# Patient Record
Sex: Male | Born: 1940 | Race: White | Hispanic: No | Marital: Married | Smoking: Former smoker
Health system: Southern US, Community
[De-identification: ages and names within clinical notes are randomized; demographics above are authoritative.]

## PROBLEM LIST (undated history)

## (undated) DIAGNOSIS — Z720 Tobacco use: Secondary | ICD-10-CM

## (undated) DIAGNOSIS — I4719 Other supraventricular tachycardia: Secondary | ICD-10-CM

## (undated) DIAGNOSIS — Z8679 Personal history of other diseases of the circulatory system: Secondary | ICD-10-CM

## (undated) DIAGNOSIS — I255 Ischemic cardiomyopathy: Secondary | ICD-10-CM

## (undated) DIAGNOSIS — I2510 Atherosclerotic heart disease of native coronary artery without angina pectoris: Secondary | ICD-10-CM

## (undated) DIAGNOSIS — I471 Supraventricular tachycardia: Secondary | ICD-10-CM

## (undated) DIAGNOSIS — I639 Cerebral infarction, unspecified: Secondary | ICD-10-CM

## (undated) DIAGNOSIS — I714 Abdominal aortic aneurysm, without rupture: Secondary | ICD-10-CM

## (undated) DIAGNOSIS — D649 Anemia, unspecified: Secondary | ICD-10-CM

## (undated) DIAGNOSIS — C61 Malignant neoplasm of prostate: Secondary | ICD-10-CM

## (undated) DIAGNOSIS — I219 Acute myocardial infarction, unspecified: Secondary | ICD-10-CM

## (undated) DIAGNOSIS — I498 Other specified cardiac arrhythmias: Secondary | ICD-10-CM

## (undated) DIAGNOSIS — I499 Cardiac arrhythmia, unspecified: Secondary | ICD-10-CM

## (undated) DIAGNOSIS — Z9289 Personal history of other medical treatment: Secondary | ICD-10-CM

## (undated) DIAGNOSIS — R011 Cardiac murmur, unspecified: Secondary | ICD-10-CM

## (undated) DIAGNOSIS — I10 Essential (primary) hypertension: Secondary | ICD-10-CM

## (undated) DIAGNOSIS — N200 Calculus of kidney: Secondary | ICD-10-CM

## (undated) DIAGNOSIS — IMO0001 Reserved for inherently not codable concepts without codable children: Secondary | ICD-10-CM

## (undated) DIAGNOSIS — R351 Nocturia: Secondary | ICD-10-CM

## (undated) DIAGNOSIS — Z8719 Personal history of other diseases of the digestive system: Secondary | ICD-10-CM

## (undated) HISTORY — PX: TONSILLECTOMY: SUR1361

## (undated) HISTORY — DX: Other supraventricular tachycardia: I47.19

## (undated) HISTORY — DX: Ischemic cardiomyopathy: I25.5

## (undated) HISTORY — DX: Tobacco use: Z72.0

## (undated) HISTORY — DX: Anemia, unspecified: D64.9

## (undated) HISTORY — DX: Atherosclerotic heart disease of native coronary artery without angina pectoris: I25.10

## (undated) HISTORY — DX: Supraventricular tachycardia: I47.1

## (undated) HISTORY — PX: PERCUTANEOUS CORONARY STENT INTERVENTION (PCI-S): SHX6016

## (undated) HISTORY — PX: OTHER SURGICAL HISTORY: SHX169

## (undated) HISTORY — PX: PROSTATE SURGERY: SHX751

## (undated) HISTORY — DX: Personal history of other diseases of the circulatory system: Z86.79

## (undated) HISTORY — DX: Acute myocardial infarction, unspecified: I21.9

## (undated) HISTORY — DX: Malignant neoplasm of prostate: C61

## (undated) HISTORY — DX: Abdominal aortic aneurysm, without rupture: I71.4

## (undated) HISTORY — PX: CARDIAC CATHETERIZATION: SHX172

## (undated) HISTORY — DX: Essential (primary) hypertension: I10

## (undated) HISTORY — DX: Cerebral infarction, unspecified: I63.9

---

## 2006-03-16 DIAGNOSIS — F17200 Nicotine dependence, unspecified, uncomplicated: Secondary | ICD-10-CM

## 2006-03-16 DIAGNOSIS — G459 Transient cerebral ischemic attack, unspecified: Secondary | ICD-10-CM

## 2006-03-16 DIAGNOSIS — I10 Essential (primary) hypertension: Secondary | ICD-10-CM

## 2006-05-11 DIAGNOSIS — K921 Melena: Secondary | ICD-10-CM | POA: Insufficient documentation

## 2006-05-11 DIAGNOSIS — J381 Polyp of vocal cord and larynx: Secondary | ICD-10-CM

## 2006-06-15 DIAGNOSIS — L570 Actinic keratosis: Secondary | ICD-10-CM

## 2006-06-15 DIAGNOSIS — C61 Malignant neoplasm of prostate: Secondary | ICD-10-CM

## 2006-07-12 DIAGNOSIS — E785 Hyperlipidemia, unspecified: Secondary | ICD-10-CM

## 2006-08-25 ENCOUNTER — Ambulatory Visit: Admission: RE | Admit: 2006-08-25 | Discharge: 2006-10-17 | Payer: Self-pay | Admitting: Radiation Oncology

## 2007-02-20 DIAGNOSIS — R0602 Shortness of breath: Secondary | ICD-10-CM | POA: Insufficient documentation

## 2007-02-27 DIAGNOSIS — R7303 Prediabetes: Secondary | ICD-10-CM

## 2007-02-27 DIAGNOSIS — D649 Anemia, unspecified: Secondary | ICD-10-CM

## 2007-12-17 DIAGNOSIS — F329 Major depressive disorder, single episode, unspecified: Secondary | ICD-10-CM

## 2008-11-03 ENCOUNTER — Encounter: Payer: Self-pay | Admitting: Cardiovascular Disease

## 2008-11-03 ENCOUNTER — Inpatient Hospital Stay (HOSPITAL_COMMUNITY): Admission: EM | Admit: 2008-11-03 | Discharge: 2008-11-11 | Payer: Self-pay | Admitting: Emergency Medicine

## 2008-11-03 ENCOUNTER — Ambulatory Visit: Payer: Self-pay | Admitting: Cardiology

## 2008-11-04 ENCOUNTER — Encounter: Payer: Self-pay | Admitting: Cardiology

## 2008-11-06 ENCOUNTER — Encounter: Payer: Self-pay | Admitting: Internal Medicine

## 2008-12-02 DIAGNOSIS — I119 Hypertensive heart disease without heart failure: Secondary | ICD-10-CM

## 2008-12-02 DIAGNOSIS — Z8546 Personal history of malignant neoplasm of prostate: Secondary | ICD-10-CM

## 2008-12-02 DIAGNOSIS — I471 Supraventricular tachycardia: Secondary | ICD-10-CM

## 2008-12-02 DIAGNOSIS — I2510 Atherosclerotic heart disease of native coronary artery without angina pectoris: Secondary | ICD-10-CM

## 2008-12-02 DIAGNOSIS — Z8679 Personal history of other diseases of the circulatory system: Secondary | ICD-10-CM | POA: Insufficient documentation

## 2008-12-02 DIAGNOSIS — F17200 Nicotine dependence, unspecified, uncomplicated: Secondary | ICD-10-CM

## 2008-12-03 ENCOUNTER — Ambulatory Visit: Payer: Self-pay | Admitting: Internal Medicine

## 2008-12-05 ENCOUNTER — Telehealth: Payer: Self-pay | Admitting: Internal Medicine

## 2008-12-16 ENCOUNTER — Ambulatory Visit: Payer: Self-pay | Admitting: Cardiovascular Disease

## 2008-12-17 LAB — CONVERTED CEMR LAB
Basophils Absolute: 0 10*3/uL (ref 0.0–0.1)
Basophils Relative: 0.7 % (ref 0.0–3.0)
Eosinophils Absolute: 0.2 10*3/uL (ref 0.0–0.7)
Eosinophils Relative: 3.2 % (ref 0.0–5.0)
HCT: 39.5 % (ref 39.0–52.0)
Hemoglobin: 13.5 g/dL (ref 13.0–17.0)
MCV: 93 fL (ref 78.0–100.0)
Monocytes Absolute: 0.4 10*3/uL (ref 0.1–1.0)
Platelets: 195 10*3/uL (ref 150.0–400.0)
RDW: 13.9 % (ref 11.5–14.6)

## 2009-02-18 ENCOUNTER — Ambulatory Visit: Payer: Self-pay | Admitting: Internal Medicine

## 2009-02-27 ENCOUNTER — Telehealth: Payer: Self-pay | Admitting: Internal Medicine

## 2009-03-03 ENCOUNTER — Telehealth: Payer: Self-pay | Admitting: Internal Medicine

## 2009-05-14 ENCOUNTER — Encounter (INDEPENDENT_AMBULATORY_CARE_PROVIDER_SITE_OTHER): Payer: Self-pay | Admitting: *Deleted

## 2009-05-15 ENCOUNTER — Ambulatory Visit: Payer: Self-pay | Admitting: Cardiology

## 2009-05-15 ENCOUNTER — Inpatient Hospital Stay (HOSPITAL_COMMUNITY): Admission: EM | Admit: 2009-05-15 | Discharge: 2009-05-17 | Payer: Self-pay | Admitting: Internal Medicine

## 2009-05-16 ENCOUNTER — Encounter: Payer: Self-pay | Admitting: Cardiology

## 2009-05-19 ENCOUNTER — Encounter: Payer: Self-pay | Admitting: Cardiology

## 2009-06-05 ENCOUNTER — Ambulatory Visit: Payer: Self-pay | Admitting: Internal Medicine

## 2009-06-19 ENCOUNTER — Telehealth: Payer: Self-pay | Admitting: Internal Medicine

## 2009-11-03 ENCOUNTER — Encounter: Payer: Self-pay | Admitting: Internal Medicine

## 2009-11-03 DIAGNOSIS — I739 Peripheral vascular disease, unspecified: Secondary | ICD-10-CM | POA: Insufficient documentation

## 2009-11-03 DIAGNOSIS — I719 Aortic aneurysm of unspecified site, without rupture: Secondary | ICD-10-CM | POA: Insufficient documentation

## 2009-11-04 ENCOUNTER — Ambulatory Visit: Payer: Self-pay | Admitting: Internal Medicine

## 2009-11-04 ENCOUNTER — Ambulatory Visit: Payer: Self-pay

## 2009-11-04 DIAGNOSIS — I714 Abdominal aortic aneurysm, without rupture, unspecified: Secondary | ICD-10-CM

## 2009-11-04 HISTORY — DX: Abdominal aortic aneurysm, without rupture: I71.4

## 2009-11-04 HISTORY — DX: Abdominal aortic aneurysm, without rupture, unspecified: I71.40

## 2009-11-06 ENCOUNTER — Telehealth: Payer: Self-pay | Admitting: Internal Medicine

## 2009-11-30 ENCOUNTER — Telehealth: Payer: Self-pay | Admitting: Internal Medicine

## 2009-12-31 ENCOUNTER — Encounter: Payer: Self-pay | Admitting: Internal Medicine

## 2009-12-31 ENCOUNTER — Telehealth: Payer: Self-pay | Admitting: Cardiovascular Disease

## 2010-01-05 ENCOUNTER — Encounter: Payer: Self-pay | Admitting: Internal Medicine

## 2010-01-22 ENCOUNTER — Telehealth: Payer: Self-pay | Admitting: Internal Medicine

## 2010-02-05 ENCOUNTER — Encounter (INDEPENDENT_AMBULATORY_CARE_PROVIDER_SITE_OTHER): Payer: Self-pay | Admitting: *Deleted

## 2010-04-14 NOTE — Letter (Signed)
Summary: Appointment - Reminder 2  [employer], Main Office  [address]   [place], [place] [address]   Phone: [phone]  Fax: [phone]     May 14, 2009 MRN: [id number]   [facility] [address] [place], [place]  [address]   Dear Mr. Tufo,  Our records indicate that it is time to schedule a follow-up appointment with Dr. Eden Emms. It is very important that we reach you to schedule this appointment. We look forward to participating in your health care needs. Please contact us at the number listed above at your earliest convenience to schedule your appointment.  If you are unable to make an appointment at this time, give [place] a call so we can update our records.     Sincerely,   Migdalia Dk [facility] Scheduling Team

## 2010-04-14 NOTE — Progress Notes (Signed)
Summary: pt having bp problems  Phone Note Call from Patient   Caller: Spouse delores Reason for Call: Talk to Nurse Summary of Call: pt's wife calling re bp problems-pls call [phone] Initial call taken by: Glynda Jaeger,  November 30, 2009 1:55 PM  Follow-up for Phone Call        BP is up but he forgot to take his medication.  Instructed pt to take his medications and to take an extra 1/2 of Clonidine 0.2mg  if needed to bring BP down.  Will call again tomorrow to check on him [phone] Dennis Bast, RN, BSN  November 30, 2009 5:14 PM lmom for pt to check on BP today Dennis Bast, RN, BSN  December 01, 2009 8:45 AM BP today 150/100 this was prior to taking his medications   BP 167/111 HR 80  Will take an extra Clonidine 0.1mg  now Spoke with Dr Johney Frame  Pt to start  HCTZ 25mg  and check   BMP with PCP in 2 weeks.  LMOM for pt with above Dennis Bast, RN, BSN  December 01, 2009 5:12 PM  Additional Follow-up for Phone Call Additional follow up Details #1::        spoke with pt;s wife they are going to pick the medication up tomorrow his BP was some beter today.  Still has times that it will spike up  I think they are checking it way too often.  She is going to back off on checking and keep a log on HCTZ and let me know what it is Dennis Bast, RN, BSN  December 02, 2009 5:48 PM     New/Updated Medications: HYDROCHLOROTHIAZIDE 25 MG TABS (HYDROCHLOROTHIAZIDE) one by mouth daily Prescriptions: HYDROCHLOROTHIAZIDE 25 MG TABS (HYDROCHLOROTHIAZIDE) one by mouth daily  #30 x 11   Entered by:   Dennis Bast, RN, BSN   Authorized by:   Hillis Range, MD   Signed by:   Dennis Bast, RN, BSN on 12/01/2009   Method used:   Electronically to        [facility]* (retail)       [address]       [place], [place]         Ph: [phone]       Fax: [phone]   RxID:   [id number]

## 2010-04-14 NOTE — Letter (Signed)
Summary: [facility] - Kidney US Bilateral  [facility] - Kidney US Bilateral   Imported By: Marylou Mccoy 01/07/2010 14:53:36  _____________________________________________________________________  External Attachment:    Type:   Image     Comment:   External Document

## 2010-04-14 NOTE — Miscellaneous (Signed)
Summary: Orders Update  Clinical Lists Changes  Orders: Added new Test order of Carotid Duplex (Carotid Duplex) - Signed 

## 2010-04-14 NOTE — Assessment & Plan Note (Signed)
Summary: eph/jml   Visit Type:  Follow-up Primary Provider:      History of Present Illness: The patient presents today for routine electrophysiology followup. He reports doing very well since his recent hospitalization for atrial tachycardia.  He is unaware of any further symptoms of tachycardia.  He has been able to quit smoking. The patient denies symptoms of palpitations, chest pain, shortness of breath, orthopnea, PND, lower extremity edema, dizziness, presyncope, syncope, or neurologic sequela.  He reports occasional symptoms of claudication.  The patient is tolerating medications without difficulties and is otherwise without complaint today.   Current Medications (verified): 1)  Aspirin 81 Mg Tbec (Aspirin) .... Take One Tablet By Mouth Daily 2)  Metoprolol Tartrate 25 Mg Tabs (Metoprolol Tartrate) .... Take One Tablet By Mouth Twice A Day 3)  Lisinopril 20 Mg Tabs (Lisinopril) .[name].. 1 Tab By Mouth Two Times A Day 4)  Clonidine Hcl 0.2 Mg Tabs (Clonidine Hcl) .[name].. 1 Tab By Mouth Two Times A Day 5)  Cardizem La 180 Mg Xr24h-Tab (Diltiazem Hcl Coated Beads) .... Take One Tablet By Mouth Once Daily. 6)  Plavix 75 Mg Tabs (Clopidogrel Bisulfate) .... Take One Tablet By Mouth Daily 7)  Selenium 100 Mcg Tabs (Selenium) .[name].. 1 By Mouth Once Daily 8)  Multaq 400 Mg Tabs (Dronedarone Hcl) .[name].. 1 By Mouth Two Times A Day 9)  Chantix 1 Mg Tabs (Varenicline Tartrate) .... Take One Tablet By Mouth Twice Daily.  Allergies: 1)  ! 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  bare-metal stent to the left circumflex about 10   years ago.    recent PCI of the LAD  Prostate surgery  Social History: Reviewed  history from 12/03/2008 and no changes required.  The patient lives in [place], [place] with his  wife.  He is retired, but remains active, working around his house.  He  smokes 1-pack-per day and has approximately 80-pack-year smoking  history.  He drinks several shots most nights.  No illicit drug use and only  takes multivitamin as far as herbal medication goes.  His diet is  regular and while he is active, he does not regularly exercise.   Review of Systems       All systems are reviewed and negative except as listed in the HPI.   Vital Signs:  Patient profile:   70 year old male Height:      71 inches Weight:      169 pounds BMI:     23.66 Pulse rate:   48 / minute BP sitting:   128 / 80  (left arm)  Vitals Entered By: Laurance Flatten CMA (June 05, 2009 10:42 AM)  Physical Exam  General:  Well developed, well nourished, in no acute distress. Head:  normocephalic and atraumatic Eyes:  PERRLA/EOM intact; conjunctiva and lids normal. Mouth:  Teeth, gums and palate normal. Oral mucosa normal. Neck:  Neck supple, no JVD. No masses, thyromegaly or abnormal cervical nodes.  Lungs:  Clear bilaterally to auscultation and percussion. Heart:  Non-displaced PMI, chest non-tender; regular rate and rhythm, S1, S2 without murmurs, rubs or gallops. Carotid upstroke normal, no bruit. Normal abdominal aortic size, no bruits. Femorals normal pulses, no bruits. Pedals normal pulses. No edema, no varicosities. Abdomen:  Bowel sounds  positive; abdomen soft and non-tender without masses, organomegaly, or hernias noted. No hepatosplenomegaly. Msk:  Back normal, normal gait. Muscle strength and tone normal. Pulses:  pulses normal in all 4 extremities Extremities:  No clubbing or cyanosis. Neurologic:  Alert and oriented x 3. Skin:  Intact without lesions or rashes. Psych:  Normal affect.   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 continue current medical regimen we discussed ablation as an option and the patient requests medical therapy for now He will consider ablation if his arrhythmias worsen  Problem # 2:  CAD (ICD-414.00) stable no symptmos of ischemia recent myoview reviewed  Problem # 3:  HYPERTENSION, UNSPECIFIED (ICD-401.9) stable no changes  Problem # 4:  TRANSIENT ISCHEMIC ATTACKS, HX OF (ICD-V12.50)  Orders: Carotid Duplex (Carotid Duplex)  Problem # 5:  TOBACCO ABUSE (ICD-305.1) cessation encouraged we will obtain an abdominal US to rule out AAA we will also evaluated with noninvasive studies of the lower extremities to evaluate claudication  Other Orders: Arterial Duplex Lower Extremity (Arterial Duplex Low) Abdominal Aorta Duplex (Abd Aorta Duplex)  Patient Instructions: 1)  Your physician recommends that you schedule a follow-up appointment in: 4 months with Dr Johney Frame  2)  on same day have all test 3)  Your physician has requested that you have an abdominal aorta duplex. During this test, an ultrasound is used to evaluate the aorta. Allow 30 minutes for this exam. Do not eat after midnight the day before and avoid carbonated beverages. There are no restrictions or special instructions. 4)  Your physician has requested that you have a carotid duplex. This test is an ultrasound of the carotid arteries in your neck. It looks at blood flow through these arteries that supply the brain with blood. Allow one hour for this exam. There are no restrictions or special instructions. 5)  Your physician has requested that you have a lower or upper extremity arterial duplex.  This test is an ultrasound of the arteries in the legs or arms.  It looks at arterial blood flow in the legs and arms.  Allow one hour for Lower and Upper Arterial scans. There are no restrictions or special instructions.

## 2010-04-14 NOTE — Progress Notes (Signed)
Summary: refill  Phone Note Refill Request Message from:  Patient on November 06, 2009 1:16 PM  Refills Requested: Medication #1:  MULTAQ 400 MG TABS 1 by mouth two times a day. [facility]  [phone]  Initial call taken by: Judie Grieve,  November 06, 2009 1:17 PM    Prescriptions: MULTAQ 400 MG TABS (DRONEDARONE HCL) 1 by mouth two times a day  #180 x 3   Entered by:   Laurance Flatten CMA   Authorized by:   Hillis Range, MD   Signed by:   Laurance Flatten CMA on 11/06/2009   Method used:   Electronically to        [facility]* (retail)       [address]       [place], [place]         Ph: [phone]       Fax: [phone]   RxID:   [id number]

## 2010-04-14 NOTE — Progress Notes (Signed)
Summary: refill**[facility]  Phone Note Refill Request   Refills Requested: Medication #1:  CHANTIX 1 MG TABS Take one tablet by mouth twice daily..   Supply Requested: 3 months [facility] in [place] ph [phone]   Method Requested: Electronic Initial call taken by: Migdalia Dk,  June 19, 2009 11:35 AM  Follow-up for Phone Call        will foward to lela in order to get in touch with matthew spencer regading chantix   Pharmacy number is [phone] Follow-up by: Kem Parkinson,  June 19, 2009 4:38 PM  Additional Follow-up for Phone Call Additional follow up Details #1::        please refill chantix for Mr Hargadon.  His wife will need to have her doctor refill hers. Additional Follow-up by: Hillis Range, MD,  June 25, 2009 5:46 PM    Additional Follow-up for Phone Call Additional follow up Details #2::    sent script for Mr. Gelpi. Called pt's home , but was unable to reach pt or wife. Nor could I leave a message. Follow-up by: Laurance Flatten CMA,  June 26, 2009 8:17 AM  Additional Follow-up for Phone Call Additional follow up Details #3:: Details for Additional Follow-up Action Taken: Contacted pt's wife with message from doctor.  Additional Follow-up by: Laurance Flatten CMA,  July 01, 2009 9:30 AM  Prescriptions: CHANTIX 1 MG TABS (VARENICLINE TARTRATE) Take one tablet by mouth twice daily.  #60 x 1   Entered by:   Laurance Flatten CMA   Authorized by:   Hillis Range, MD   Signed by:   Laurance Flatten CMA on 06/26/2009   Method used:   Electronically to        [facility]* (retail)       [address]       [place], [place]         Ph: [phone]       Fax: [phone]   RxID:   [id number]

## 2010-04-14 NOTE — Miscellaneous (Signed)
Summary: Orders Update  Clinical Lists Changes  Problems: Added new problem of AORTIC ATHEROSCLEROSIS (ICD-440.0) Orders: Added new Test order of Abdominal Aorta Duplex (Abd Aorta Duplex) - Signed 

## 2010-04-14 NOTE — Letter (Signed)
Summary: [employer]  Authorization Notification  [employer]  Authorization Notification   Imported By: Roderic Ovens 06/12/2009 11:17:45  _____________________________________________________________________  External Attachment:    Type:   Image     Comment:   External Document

## 2010-04-14 NOTE — Progress Notes (Signed)
Summary: spouse has a question [facility])  Phone Note Call from Patient Call back at Home Phone [phone]   Caller: Spouse delores  Reason for Call: Talk to Nurse Summary of Call: please call-wife has [phone] Initial call taken by: Glynda Jaeger,  January 22, 2010 10:06 AM  Follow-up for Phone Call        Message was just recently routed to the triage desktop. I left a message for the pt to call. Sherri Rad, RN, BSN  January 22, 2010 4:40 PM  spoke with pt and then she spoke with Dr Johney Frame at the hospital this weekend  He answered her questions Dennis Bast, RN, BSN  January 26, 2010 4:45 PM

## 2010-04-14 NOTE — Progress Notes (Signed)
Summary: pt needs refill  Phone Note Refill Request Call back at Home Phone [phone] Message from:  [facility]  Refills Requested: Medication #1:  CARDIZEM LA 180 MG XR24H-TAB Take one tablet by mouth once daily. Initial call taken by: Omer Jack,  January 22, 2010 3:16 PM    Prescriptions: CARDIZEM LA 180 MG XR24H-TAB (DILTIAZEM HCL COATED BEADS) Take one tablet by mouth once daily.  #30 x 5   Entered by:   Laurance Flatten CMA   Authorized by:   Hillis Range, MD   Signed by:   Laurance Flatten CMA on 01/22/2010   Method used:   Electronically to        [facility]* (retail)       [address]       [place], [place]         Ph: [phone]       Fax: [phone]   RxID:   [id number]

## 2010-04-14 NOTE — Progress Notes (Signed)
Summary: c/o b/p high for several days.  Phone Note Call from Patient Call back at Home Phone [phone] Call back at [phone]   Caller: Spouse Reason for Call: Talk to Nurse Summary of Call: c/o pt on new blood pressure. b/p today 121/69. pt b/p for several day has been high. discuss option - meds.  Initial call taken by: Lorne Skeens,  December 31, 2009 1:51 PM  Follow-up for Phone Call        spoke with pt wife, pt bp is all over the place. he was seen by primary care md and they cut his bp in 1/2 and are doing alot of testing. ask for testing to be sent to [place]. Deliah Goody, RN  December 31, 2009 6:45 PM

## 2010-04-14 NOTE — Miscellaneous (Signed)
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 

## 2010-04-14 NOTE — Miscellaneous (Signed)
  Clinical Lists Changes  Medications: Added new medication of NITROSTAT 0.4 MG SUBL (NITROGLYCERIN) 1 tablet under tongue at onset of chest pain; you may repeat every 5 minutes for up to 3 doses. - Signed Rx of NITROSTAT 0.4 MG SUBL (NITROGLYCERIN) 1 tablet under tongue at onset of chest pain; you may repeat every 5 minutes for up to 3 doses.;  #25 x 12;  Signed;  Entered by: Deliah Goody, RN;  Authorized by: Hillis Range, MD;  Method used: Electronically to [facility] [address], [place], [place]  , Ph: [phone], Fax: [phone]    Prescriptions: NITROSTAT 0.4 MG SUBL (NITROGLYCERIN) 1 tablet under tongue at onset of chest pain; you may repeat every 5 minutes for up to 3 doses.  #25 x 12   Entered by:   Deliah Goody, RN   Authorized by:   Hillis Range, MD   Signed by:   Deliah Goody, RN on 02/05/2010   Method used:   Electronically to        [facility]* (retail)       [address]       [place], [place]         Ph: [phone]       Fax: [phone]   RxID:   [id number]

## 2010-04-14 NOTE — Assessment & Plan Note (Signed)
Summary: per check out/also have aorta/aterial/cartiod before/saf   Visit Type:  Follow-up Primary Provider:  Colon Branch, MD, [facility]   History of Present Illness: The patient presents today for routine electrophysiology followup. He reports doing very well since his last visit.  He is unaware of any further symptoms of tachycardia.  He has  not been able to quit smoking. The patient denies symptoms of palpitations, chest pain, shortness of breath, orthopnea, PND, lower extremity edema, dizziness, presyncope, syncope, or neurologic sequela.  He reports occasional symptoms of claudication.  The patient is tolerating medications without difficulties and is otherwise without complaint today.   Current Medications (verified): 1)  Aspirin 81 Mg Tbec (Aspirin) .... Take One Tablet By Mouth Daily 2)  Metoprolol Tartrate 25 Mg Tabs (Metoprolol Tartrate) .... Take One Tablet By Mouth Twice A Day 3)  Lisinopril 20 Mg Tabs (Lisinopril) .[name].. 1 Tab By Mouth Two Times A Day 4)  Clonidine Hcl 0.2 Mg Tabs (Clonidine Hcl) .[name].. 1 Tab By Mouth Two Times A Day 5)  Cardizem La 180 Mg Xr24h-Tab (Diltiazem Hcl Coated Beads) .... Take One Tablet By Mouth Once Daily. 6)  Plavix 75 Mg Tabs (Clopidogrel Bisulfate) .... Take One Tablet By Mouth Daily 7)  Selenium 100 Mcg Tabs (Selenium) .[name].. 1 By Mouth Once Daily 8)  Multaq 400 Mg Tabs (Dronedarone Hcl) .[name].. 1 By Mouth Two Times A Day  Allergies: 1)  ! 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  bare-metal stent to the left circumflex about 10   years ago.    recent PCI of the LAD  Prostate  surgery  Social History: Reviewed history from 12/03/2008 and no changes required.  The patient lives in [place], [place] with his  wife.  He is retired, but remains active, working around his house.  He  smokes 1-pack-per day and has approximately 80-pack-year smoking  history.  He drinks several shots most nights.  No illicit drug use and only  takes multivitamin as far as herbal medication goes.  His diet is  regular and while he is active, he does not regularly exercise.   Review of Systems       All systems are reviewed and negative except as listed in the HPI.   Vital Signs:  Patient profile:   70 year old male Height:      71 inches Weight:      160 pounds BMI:     22.40 Pulse rate:   50 / minute BP sitting:   160 / 84  (left arm)  Vitals Entered By: Laurance Flatten CMA (November 04, 2009 10:47 AM)  Physical Exam  General:  Well developed, well nourished, in no acute distress. Head:  normocephalic and atraumatic Mouth:  Teeth, gums and palate normal. Oral mucosa normal. Neck:  Neck supple, no JVD. No masses, thyromegaly or abnormal cervical nodes.  Lungs:  Clear bilaterally to auscultation and percussion. Heart:  Non-displaced PMI, chest non-tender; regular rate and rhythm, S1, S2 without murmurs, rubs or gallops. Carotid upstroke normal, no bruit. Normal abdominal aortic size, no bruits. Femorals normal pulses, no bruits. Pedals normal pulses. No edema, no varicosities. Abdomen:  Bowel sounds positive; abdomen soft and non-tender without masses,  organomegaly, or hernias noted. No hepatosplenomegaly. Msk:  Back normal, normal gait. Muscle strength and tone normal. Pulses:  pulses normal in all 4 extremities Extremities:  No clubbing or cyanosis. Neurologic:  Alert and oriented x 3.   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

## 2010-05-13 ENCOUNTER — Telehealth: Payer: Self-pay | Admitting: Internal Medicine

## 2010-05-20 NOTE — Progress Notes (Signed)
Summary: rx refill  Phone Note Refill Request Call back at Home Phone [phone] Call back at [phone] Message from:  Patient on May 13, 2010 2:54 PM  Refills Requested: Medication #1:  LISINOPRIL 20 MG TABS 1 tab by mouth two times a day  Method Requested: Telephone to Pharmacy Initial call taken by: Roe Coombs,  May 13, 2010 2:54 PM  Follow-up for Phone Call        calling back for refill [facility] to [phone] pt is out medication and would like a call when the refill is called in [facility]  May 14, 2010 1:08 PM    Prescriptions: LISINOPRIL 20 MG TABS (LISINOPRIL) 1 tab by mouth two times a day  #180 x 3   Entered by:   Laurance Flatten CMA   Authorized by:   Hillis Range, MD   Signed by:   Laurance Flatten CMA on 05/14/2010   Method used:   Electronically to        [facility]* (retail)       [address]       [place], [place]         Ph: [phone]       Fax: [phone]   RxID:   [id number]

## 2010-06-07 LAB — CBC
HCT: 39.3 % (ref 39.0–52.0)
MCHC: 33.8 g/dL (ref 30.0–36.0)
Platelets: 173 10*3/uL (ref 150–400)
RBC: 4.28 MIL/uL (ref 4.22–5.81)
RDW: 15 % (ref 11.5–15.5)
WBC: 8.5 10*3/uL (ref 4.0–10.5)

## 2010-06-07 LAB — BASIC METABOLIC PANEL
BUN: 14 mg/dL (ref 6–23)
Calcium: 8.3 mg/dL — ABNORMAL LOW (ref 8.4–10.5)
Creatinine, Ser: 0.9 mg/dL (ref 0.4–1.5)
GFR calc non Af Amer: >60 mL/min (ref >60)
Sodium: 135 mEq/L (ref 135–145)

## 2010-06-07 LAB — LIPID PANEL
HDL: 33 mg/dL — ABNORMAL LOW (ref >39)
Total CHOL/HDL Ratio: 6.5 RATIO
Triglycerides: 128 mg/dL (ref <150)

## 2010-06-07 LAB — CK TOTAL AND CKMB (NOT AT ARMC)
CK, MB: 1.2 ng/mL (ref 0.3–4.0)
Relative Index: INVALID (ref 0.0–2.5)
Relative Index: INVALID (ref 0.0–2.5)

## 2010-06-07 LAB — TROPONIN I
Troponin I: 0.01 ng/mL (ref 0.00–0.06)
Troponin I: 0.02 ng/mL (ref 0.00–0.06)

## 2010-06-08 ENCOUNTER — Telehealth: Payer: Self-pay | Admitting: Internal Medicine

## 2010-06-08 NOTE — Telephone Encounter (Signed)
Called patient back and left message on voicemail to call me back

## 2010-06-10 NOTE — Telephone Encounter (Signed)
Left another message for patient.  If he needs me to call back if not we will see at next scheduled visit

## 2010-06-19 LAB — CARDIAC PANEL(CRET KIN+CKTOT+MB+TROPI)
CK, MB: 1.1 ng/mL (ref 0.3–4.0)
CK, MB: 37.4 ng/mL — ABNORMAL HIGH (ref 0.3–4.0)
CK, MB: 55.8 ng/mL — ABNORMAL HIGH (ref 0.3–4.0)
Relative Index: 14.7 — ABNORMAL HIGH (ref 0.0–2.5)
Relative Index: 17.8 — ABNORMAL HIGH (ref 0.0–2.5)
Relative Index: INVALID (ref 0.0–2.5)
Total CK: 176 U/L (ref 7–232)
Total CK: 254 U/L — ABNORMAL HIGH (ref 7–232)
Total CK: 31 U/L (ref 7–232)
Total CK: 33 U/L (ref 7–232)

## 2010-06-19 LAB — PROTIME-INR
INR: 1 (ref 0.00–1.49)
Prothrombin Time: 13 seconds (ref 11.6–15.2)
Prothrombin Time: 13.4 seconds (ref 11.6–15.2)

## 2010-06-19 LAB — BASIC METABOLIC PANEL
BUN: 16 mg/dL (ref 6–23)
BUN: 16 mg/dL (ref 6–23)
BUN: 7 mg/dL (ref 6–23)
CO2: 25 mEq/L (ref 19–32)
Chloride: 102 mEq/L (ref 96–112)
Chloride: 104 mEq/L (ref 96–112)
Creatinine, Ser: 0.75 mg/dL (ref 0.4–1.5)
Creatinine, Ser: 0.79 mg/dL (ref 0.4–1.5)
GFR calc non Af Amer: >60 mL/min (ref >60)
GFR calc non Af Amer: >60 mL/min (ref >60)
Glucose, Bld: 111 mg/dL — ABNORMAL HIGH (ref 70–99)
Glucose, Bld: 111 mg/dL — ABNORMAL HIGH (ref 70–99)
Potassium: 3.7 mEq/L (ref 3.5–5.1)
Potassium: 4.1 mEq/L (ref 3.5–5.1)
Sodium: 134 mEq/L — ABNORMAL LOW (ref 135–145)

## 2010-06-19 LAB — CBC
HCT: 34.4 % — ABNORMAL LOW (ref 39.0–52.0)
Hemoglobin: 11.8 g/dL — ABNORMAL LOW (ref 13.0–17.0)
Hemoglobin: 13.3 g/dL (ref 13.0–17.0)
MCV: 94 fL (ref 78.0–100.0)
Platelets: 161 10*3/uL (ref 150–400)
RDW: 15.2 % (ref 11.5–15.5)
WBC: 7.4 10*3/uL (ref 4.0–10.5)

## 2010-06-19 LAB — CK TOTAL AND CKMB (NOT AT ARMC)
CK, MB: 1.3 ng/mL (ref 0.3–4.0)
Total CK: 43 U/L (ref 7–232)

## 2010-06-19 LAB — DIFFERENTIAL
Basophils Absolute: 0 10*3/uL (ref 0.0–0.1)
Eosinophils Relative: 3 % (ref 0–5)
Lymphocytes Relative: 23 % (ref 12–46)
Neutro Abs: 5.1 10*3/uL (ref 1.7–7.7)
Neutrophils Relative %: 70 % (ref 43–77)

## 2010-06-19 LAB — TROPONIN I: Troponin I: 0.03 ng/mL (ref 0.00–0.06)

## 2010-06-19 LAB — LIPID PANEL
Cholesterol: 184 mg/dL (ref 0–200)
LDL Cholesterol: 135 mg/dL — ABNORMAL HIGH (ref 0–99)
Triglycerides: 108 mg/dL (ref <150)

## 2010-06-19 LAB — TSH: TSH: 1.784 u[IU]/mL (ref 0.350–4.500)

## 2010-07-07 ENCOUNTER — Telehealth: Payer: Self-pay | Admitting: Internal Medicine

## 2010-07-07 NOTE — Telephone Encounter (Signed)
[phone] [facility]-refills of clonidine .02 mg and lopressor 25mg 

## 2010-07-08 MED ORDER — METOPROLOL TARTRATE 25 MG PO TABS: 25.0000 mg | ORAL_TABLET | Freq: Two times a day (BID) | ORAL | Status: DC

## 2010-07-08 MED ORDER — CLONIDINE HCL 0.2 MG PO TABS: 0.2000 mg | ORAL_TABLET | Freq: Two times a day (BID) | ORAL | Status: DC

## 2010-07-27 NOTE — H&P (Signed)
Jerry Gilbert, Jerry Gilbert                ACCOUNT NO.:  [id number]   MEDICAL RECORD NO.:  [id number]          PATIENT TYPE:  INP   LOCATION:  3730                         FACILITY:  [facility]   PHYSICIAN:  Jerry C. Wall, MD, FACCDATE OF BIRTH:  Jul 31, 1940   DATE OF ADMISSION:  11/03/2008  DATE OF DISCHARGE:                              HISTORY & PHYSICAL   PRIMARY CARDIOLOGIST:  (New) Jerry C. Jerry Emms, MD, [facility]   CHIEF COMPLAINT:  Chest pain, tachy palpitations, dyspnea on exertion,  nausea.   HISTORY OF PRESENT ILLNESS:  Mr. 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   The patient reports that symptoms for approximately last 3 weeks  intermittently and recent workup by his old cardiologist with diagnosis  of paroxysmal atrial tachycardia.  The patient also had an  echocardiogram completed that showed low-normal EF at 50% but no other  details known from that study.  The patient has continued to have  symptoms and went to his primary care office and was seen by a nurse  practitioner, who increased her Lopressor from 25 mg p.o. b.i.d. to 25  q.a.m. and 50 mg q.p.m.  Unfortunately, the patient's symptoms have  continued and he went to the emergency department in some town in  [place].  His wife requested that he be transfer to [facility]  so that he can be evaluated by [facility].  Upon arrival to  [facility], the patient's heart rate initially in the 140s  with  BP 146/118, now bouncing from 60-140s, pressure 145/90.  EKG shows  normal sinus rhythm without acute changes, one PVC.  No lab work or  chest x-ray available currently.   PAST MEDICAL HISTORY:  1. CAD S/P bare-metal stent to the circumflex and      normal/nonobstructive disease in the rest of his anatomy      approximately 10 years ago per the patient's report.  2. Labile hypertension.  3. PAT, diagnosed approximately 3 weeks ago.  4. History of TIAs.  5. History of prostate cancer, S/P surgery, radiation and hormone      therapy.   SOCIAL HISTORY:  The patient lives in [place], [place] with his  wife.  He is retired, but remains active, working around his house.  He  smokes 1-pack-per day and has approximately 80-pack-year smoking  history.  He rarely consumes alcohol.  No illicit drug use and only  takes multivitamin as far as herbal medication goes.  His diet is  regular and while he is active, he does not regularly exercise.   FAMILY HISTORY:  Mother died at age 5  from heart attack.  Father  deceased but no history of CAD.  One brother with CABG ? age of  diagnosis with CAD and another brother status post cardioversion,  question arrhythmia.   REVIEW OF SYSTEMS:  Please see HPI.  All other systems reviewed,   REVIEW OF SYSTEMS:  The patient has chronic dyspnea on exertion,  significantly worse over the last 3 weeks.  Also significant worsening  to orthopnea, abdominal fullness, lower extremity edema although mild  currently, and mild presyncope recently.  Also recent nausea without  vomiting.  All other systems reviewed and were negative.   CODE STATUS:  Full.   ALLERGIES:  1. CELEBREX.  2. COUMADIN and PLAVIX (nosebleeds).   MEDICATIONS:  1. Aspirin 81 mg p.o. daily.  2. Lisinopril 20 mg p.o. b.i.d.  3. Lopressor 25 mg p.o. q.a.m. and 50 mg p.o. q.p.m.  4. Clonidine 0.2 mg p.o. b.i.d.   PHYSICAL EXAMINATION:  VITAL SIGNS:  Temp 97.1 degrees Fahrenheit,  BP  146/118 down to 145/90, pulse 60-140, respiratory rate 21, O2 saturation  98% on 2 liters by nasal cannula.  GENERAL:  The patient is alert and oriented x3 in no apparent distress,  is able to move and speak fairly easily without any respiratory  distress.  HEAD:  Normocephalic, atraumatic.  Pupils equal, round, reactive to  light.  Extraocular muscles are intact.  Nares are patent without  discharge.  Dentition is poor.  Oropharynx without erythema or exudates.  NECK:  Supple without lymphadenopathy.  No thyromegaly.  No bruits.  JVD  approximately 6-8 cm.  CARDIOVASCULAR:  Heart rate is irregular with audible S1 and S2.  No  clicks, rubs, murmurs, or gallops.  Pulses 1+ bilaterally lower  extremities and 2+ bilaterally in radials.  LUNGS:  Clear to auscultation bilaterally.  SKIN:  No rashes, lesions, or petechiae.  ABDOMEN:  Soft, nontender, nondistended.  Normal abdominal bowel sounds.  No rebound or guarding.  No hepatosplenomegaly on palpation.  EXTREMITIES:  No clubbing or cyanosis.  Trace ankle edema bilaterally.  MUSCULOSKELETAL:  No joint deformity or effusions or spinal or CVA  tenderness.  NEURO:  Cranial nerves II through XII are grossly intact.  Strength 5/5  in all extremities and axial groups.  Normal sensation throughout and  normal cerebellar function.   RADIOLOGY:  1. Chest x-ray pending.  2. EKG, sinus bradycardia, rate 55, no acute ST-T wave changes, no      significant Q-waves, one PVC, left axis deviation, no evidence of      hypertrophy, question left anterior fascicular block, intervals PR      140, QRS 90, and QTc 371.   LABORATORY DATA:  Pending.   ASSESSMENT AND PLAN:  Mr. Jerry Gilbert is a 70 year old Caucasian male with  known coronary artery disease status post bare-metal stent to the  circumflex and nonobstructive disease.  Otherwise per the patient's  report, labile hypertension, ongoing 80-plus-pack-year tobacco abuse  disorder, history of  transient ischemic attacks, and recent diagnosis of  paroxysmal atrial tachycardia presenting with chest pain, shortness of  breath, dyspnea on exertion, nausea, and tachy palpitations in the  setting of paroxysmal atrial tachycardia into the 140s.  Paroxysmal atrial tachycardia.  Admit to telemetry, cycle cardiac  enzymes, start p.o. diltiazem, check chest x-ray, consult EP if rhythm  is stubborn.  Also, we will plan for Myoview tomorrow as long as cardiac  enzymes are  negative and we will check a 2-D echocardiogram today.  The patient  needs to undergo tobacco cessation counseling as well as decreased  caffeine intake.  Dr. Eden Gilbert is to see the patient tomorrow in the a.m..  Dr. Daleen Squibb has seen the patient, agrees to this assessment and plan.      Jarrett Ables, PAC      Jerry C. Daleen Squibb, MD, [facility]  Electronically Signed    MS/MEDQ  D:  11/03/2008  T:  11/04/2008  Job:  [id number]

## 2010-07-27 NOTE — Consult Note (Signed)
Jerry Gilbert, Jerry Gilbert                ACCOUNT NO.:  [id number]   MEDICAL RECORD NO.:  [id number]          PATIENT TYPE:  INP   LOCATION:  2039                         FACILITY:  [facility]   PHYSICIAN:  Hillis Range, MD       DATE OF BIRTH:  1940-05-16   DATE OF CONSULTATION:  DATE OF DISCHARGE:                                 CONSULTATION   REQUESTING PHYSICIAN:  Arturo Morton. Riley Kill, MD, [facility]   REASON FOR CONSULTATION:  Atrial tachycardia.   HISTORY OF PRESENT ILLNESS:  Mr. Sweetin is a pleasant 70 year old  gentleman with a history of coronary artery disease, hypertension, and  longstanding tobacco use who was recently diagnosed with paroxysmal  atrial tachycardia.  He notes that over the past 3-4 weeks he has had  intermittent episodes of abrupt onset and termination of heart racing.  These episodes are worsened with activities.  He reports associated  shortness of breath, palpitations, and presyncope.  He was evaluated at  [facility] and diagnosed with atrial tachycardia.  He was  initiated on metoprolol therapy.  He was discharged, but continued to  have episodic heart racing.  He reports progressive symptoms of  shortness of breath and chest discomfort.  He therefore presented to  [facility] for further evaluation.  He underwent stress testing which  was abnormal.  He therefore had a left heart catheterization performed  on November 05, 2008, which revealed a patent stent to the left  circumflex, nonobstructive disease of right coronary artery, and a  discrete 90% mid LAD stenosis.  He underwent stenting of the mid LAD  with 2 non-drug-eluting stents overlapping.  He had brief episodes of  chest discomfort overnight as well as multiple episodes of heart  racing.  On telemetry, he has been observed to have a paroxysmal atrial  tachycardia.  He was placed on diltiazem and has had significant  improvement in his PAT, though the patient has primarily been non-  ambulatory.  He is  observed to have short runs of atrial tachycardia  when ambulating to and from the bathroom.  Presently, he is resting  comfortably and without complaint.   PAST MEDICAL HISTORY:  1. Coronary artery disease, status post percutaneous coronary      intervention to the left circumflex approximately 10 years ago.  2. Hypertension.  3. History of TIAs.  4. History of prostate cancer, status post surgery and radiation with      hormone therapy.  5. Paroxysmal atrial tachycardia (as above).  6. Longstanding tobacco use with chronic bronchitis.   HOME MEDICATIONS:  Aspirin, lisinopril, Lopressor 25 mg q.a.m. and 50 mg  q.p.m., and clonidine 0.2 mg b.i.d.   ALLERGIES:  CELEBREX.  He has also had difficulties with nosebleeds with  COUMADIN and PLAVIX.   SOCIAL HISTORY:  The patient lives in [place] with his  wife.  He is retired, but remains very active.  The patient's wife is a  Engineer, civil (consulting) at [employer] on the weekends.  He has an 80-pack-year  history of tobacco, which is ongoing.  He has a  history of alcohol  abuse, but presently drinks socially.  He denies drug use.   FAMILY HISTORY:  Notable for coronary artery disease.   REVIEW OF SYSTEMS:  All systems are reviewed and negative except as  outlined in the HPI above.   Telemetry reveals sinus bradycardia with intermittent episodes of an  atrial tachycardia.   PHYSICAL EXAMINATION:  VITAL SIGNS:  Blood pressure 165/89, heart rate  82, respirations 20, sats 96% on room air, and afebrile.  GENERAL:  The patient is an elderly male in no acute distress.  He  appears older than his stated age.  He is alert and oriented x3.  HEENT:  Normocephalic and atraumatic.  Sclerae are clear.  Conjunctivae  are pink.  Oropharynx is clear.  NECK:  Supple.  No thyromegaly, JVD, or bruits.  LUNGS:  Clear to auscultation bilaterally.  HEART:  Regular rate and rhythm.  No murmurs, rubs, or gallops.  GI:  Soft, nontender, and  nondistended.  Positive bowel sounds.  EXTREMITIES:  No clubbing or cyanosis.  Trace lower extremity edema is  noted bilaterally.  No hematomas are observed.  MUSCULOSKELETAL:  No deformity or atrophy.  SKIN:  No lesions.  NEURO:  Strength and sensation are intact.  PSYCH:  Euthymic mood and full affect.   EKG reveals sinus bradycardia with heart rate of 57 beats per minute  today with LVH and no evidence of ischemia.  I have reviewed multiple  EKGs in the chart including an EKG from November 05, 2008, which documents  an atrial tachycardia.  The P-wave vector is similar to sinus, however,  the P-wave itself is of a slightly different morphology.  His QT  interval is normal.   LABORATORY DATA:  Cardiac markers:  CK 313, CK-MB 55.8, and troponin  4.86.  Sodium 134, potassium 4.1, glucose 111, BUN 16, and creatinine  0.8.  White blood cell count 6.6, hematocrit 34, and platelets 161.  INR  1.  TSH 1.784.   Echocardiogram from November 04, 2008, reveals a left ventricular end-  diastolic dimension of 43.  The left atrium is mildly dilated.  There is  mild mitral regurgitation.  The left ventricular ejection fraction 40-  45% with inferior and septal hypokinesis.   The catheterization is reviewed and documented in the HPI.   The Myoview is reviewed.   Chest x-ray, I have reviewed the patient's chest x-ray from November 03, 2008, which reveals right basilar atelectasis and is otherwise  unremarkable.   IMPRESSION:  Mr. Jerry Gilbert is a pleasant 70 year old gentleman who was  admitted with symptomatic paroxysmal atrial tachycardia as well as an  acute coronary syndrome.  He is status post stenting of his LAD vessel.  He is ruled in for myocardial infarction.  Presently, the patient has no  ischemic changes on his EKG and he is without symptoms of ischemia at  this time.  He is therefore planned to transfer out to a telemetry bed.  I have discussed his elevated cardiac markers with Dr. Riley Kill  who feels  that this is likely due to a septal perforator occlusion.  His  recommendation is medical management and observation at this time.   The patient's atrial tachycardia has not improved with metoprolol, but  appears to be improving with low-dose diltiazem.  I had a long  discussion with the patient and his wife regarding therapeutic  strategies for atrial tachycardia including both medicine and catheter-  based therapies.  As he has recently had  stenting of his LAD with  elevation of his cardiac markers, I do not feel that he is a candidate  for catheter ablation presently.  If we were to consider catheter  ablation, I think we should wait at least 3-4 weeks.  As he appears to  be responding with diltiazem, I would recommend that we titrate his  diltiazem as tolerated.  I have therefore placed him on diltiazem CD 120  mg twice daily and decrease his Lopressor to 25 mg twice daily.  If his  heart rate allows, we will consider further titration of his diltiazem  in the morning.  If he fails medical therapy with diltiazem, then I  think that our next option would be a 3-day initiation of Tikosyn versus  amiodarone long term.  The patient will continue to contemplate these  strategies.  I will follow up with him in the morning.      Hillis Range, MD  Electronically Signed     JA/MEDQ  D:  11/06/2008  T:  11/07/2008  Job:  [id number]

## 2010-07-27 NOTE — Cardiovascular Report (Signed)
Jerry Gilbert, Jerry Gilbert                ACCOUNT NO.:  [id number]   MEDICAL RECORD NO.:  [id number]           PATIENT TYPE:   LOCATION:                                 FACILITY:   PHYSICIAN:  Arturo Morton. Riley Kill, MD, FACCDATE OF BIRTH:  12-07-1940   DATE OF PROCEDURE:  11/05/2008  DATE OF DISCHARGE:                            CARDIAC CATHETERIZATION   INDICATIONS:  Mr. Hartwig is a 70 year old who presented with a  supraventricular tachycardia and chest pain.  He has had previous  stenting to the circumflex artery.  He underwent diagnostic  catheterization by Dr. Eden Emms demonstrating a high-grade lesion in the  left anterior descending artery.  Unfortunately, the patient has had an  significant nosebleed in the past with Plavix.  It is not occurred in  quite some time.  As a result, Dr. Eden Emms felt that we would be best  served by stenting of the LAD.  We discussed this with the patient and  he was agreeable to proceed.  He had been taken off the table and put  back on the table.   PROCEDURE:  Percutaneous stenting of the left anterior descending artery  with overlapping non drug-eluting mini Vision stents.   DESCRIPTION OF PROCEDURE:  The patient was in the catheterization  laboratory and prepped and draped.  The 5-French sheath was exchanged  for a 6-French sheath.  A JL-3 guiding catheter was utilized.  ACT was  checked and then bivalirudin was administered.  Repeat ACT was then  rechecked.  Predilatation was done with 2.25 mm x 8 balloon.  We then  stented the mid lesion, specifically being about a 2.5 x 12 mini Vision  non drug-eluting platform, which was taken to approximately 11-12  atmospheres.  Importantly, we had measured the lesion before and it was  clear that we would not be able to cover the entire area with one stent.  Based on the original plan, we elected to focally stent the high-grade  focal lesion.  Postdilatation was then done with an 8 x 2.5 a Quantum  apex  noncompliant balloon.  Dilatations were done as high as 15 and 16  atmospheres.  Following this, we re-reviewed the situation.  The  remained segmental disease in a second area distal to the stented area.  This did not appear to be substantially improved and we were concerned  about the potential for outflow obstruction.  As a result, we placed an  overlapping 28 x 2.5 mini Vision drug-eluting stent, which was then  taken to approximately 12-13 atmospheres.  Postdilatation was then done  throughout the 2 overlapping stents using the 2.5 Quantum apex balloon.  There was probably loss of a tiny septal perforating branch, but there  was marked improvement in the appearance of the vessel.  He was given  some pain medications.  We were unable to close the artery as the  patient had previously been taken off the table prior to the  percutaneous intervention.  The 6-French sheath was upgraded for a 7-  French sheath because of some mild oozing.  Bivalirudin was stopped and  he was taken to the holding area in satisfactory clinical condition.  I  did speak with the patient's wife in detail.  He will need to have his  arrhythmia addressed as well.   ANGIOGRAPHIC DATA:  The left anterior descending artery demonstrates a  segmental area of disease in the mid vessel that is about 90% luminal  reduction.  Just distal to this, the vessel opens up and then there is a  long area of 60-70% segmental disease.  After crossing both areas and  with overlapping stents, there was marked improvement in the reduction  of the stenosis to less than 0%.  There was excellent runoff into the  distal vessel.  There was perhaps a tiny septal branch loss from the  overlapping stents.  There were no major complications.  He was taken to  the holding area in satisfactory condition.   DISPOSITION:  1. The patient will need a minimum of Plavix for approximately 4      weeks.  He will be followed by Dr. Eden Emms.  2. He will  need EP to address the arrhythmia situation.  He reportedly      has an allergy to COUMADIN, but this is likely related to bleeding      as well.      Arturo Morton. Riley Kill, MD, [facility]  Electronically Signed     TDS/MEDQ  D:  11/05/2008  T:  11/06/2008  Job:  [id number]   cc:   Noralyn Pick. Eden Emms, MD, [facility]

## 2010-07-27 NOTE — Cardiovascular Report (Signed)
[name], Jerry Gilbert                ACCOUNT NO.:  [id number]   MEDICAL RECORD NO.:  [id number]          PATIENT TYPE:  INP   LOCATION:  3730                         FACILITY:  [facility]   PHYSICIAN:  Noralyn Pick. Eden Emms, MD, FACCDATE OF BIRTH:  Nov 02, 1940   DATE OF PROCEDURE:  DATE OF DISCHARGE:                            CARDIAC CATHETERIZATION   Coronary arteriography.   INDICATIONS:  Recurrent chest pain, previous stent to the circ.  A cine  catheterization was done with 5-French catheters from right femoral  artery.   Note should be made that during the case, the patient had fairly  persistent atrial arrhythmias.  He appeared to have short burst of  atrial tachycardia and also episodes of frequent PACs with plaque.   Left main coronary artery had a 20% discrete stenosis.  There was some  calcification.   The left anterior descending artery had an 80% focal lesion just after  the septal perforator.  There was 30% multiple discrete lesions in the  distal LAD, first diagonal branch had 30% multiple discrete lesions.   The circumflex coronary artery had 30% multiple discrete lesions  proximally.  There was 30% in-stent restenosis.  The first and second  obtuse marginal branches were normal.  The AV groove branch was normal.   The right coronary artery was dominant.   The proximal vessel had 30% smooth disease.   The mid vessel was calcified.  There was 40% tubular disease in the mid  vessel.   The distal RCA, PDA, and PLA were normal.  There were no right-to-left  collaterals.   RAO ventriculography showed some global hypokinesis.  EF was 50%.  There  was no significant MR.   Aortic pressure was 134/75, LV pressure was 130/19.   IMPRESSION:  Stent is patent.  The patient has a focal lesion in the mid  left anterior descending artery.  I think a bare-metal stent given his  history of nosebleeds on Plavix would be reasonable.  I will discuss the  case with Dr. Riley Kill.   Subsequently, he will need further workup in  regards to his atrial arrhythmias.      Noralyn Pick. Eden Emms, MD, [facility]  Electronically Signed    PCN/MEDQ  D:  11/05/2008  T:  11/06/2008  Job:  [id number]

## 2010-07-27 NOTE — Discharge Summary (Signed)
NAMEKALIEB, FREELAND                ACCOUNT NO.:  [id number]   MEDICAL RECORD NO.:  [id number]          PATIENT TYPE:  INP   LOCATION:  2039                         FACILITY:  [facility]   PHYSICIAN:  Hillis Range, MD       DATE OF BIRTH:  [birth year]   DATE OF ADMISSION:  11/03/2008  DATE OF DISCHARGE:  11/11/2008                               DISCHARGE SUMMARY   FINAL DIAGNOSES:  1. Admitted with chest pain/tachy palpitations/dyspnea.      a.     Stress Myoview study, November 04, 2008, ejection fraction 40%       to 45%.  There was a small inferior wall infarct, peri-infarct       ischemia.      b.     Diagnostic left heart catheterization, November 05, 2008.  The       LAD had an 80% stenosis after the first septal perforator; the       left circumflex had a 30% in-stent restenosis; obtuse marginal one       and obtuse marginal two were normal; right coronary artery had a       30% proximal stenosis.      c.     Therapeutic catheterization, November 05, 2008, with placement       of overlapping bare-metal stents to the LAD with loss of a tiny       septal perforator.      d.     Post cath, the patient had troponin-I excursion with a       Zenith of 4.86 on November 06, 2008.  He also had marked chest pain       post cath (this was probably result of the loss of the septal       perforator perfusion to the septum).      e.     The patient will be on aspirin and Plavix for 4 weeks.   SECONDARY DIAGNOSES:  1. The patient had a bare-metal stent to the left circumflex about 10      years ago.  This is the stent which has a 30% in-stent restenosis      at catheterization November 05, 2008.  2. Ongoing tobacco habituation/chronic bronchitis.  3. Hypertension.  4. Recent diagnosis of paroxysmal atrial tachycardia (the patient      still has a burst of short lived atrial arrhythmias).  5. Echocardiogram November 04, 2008, ejection fraction 40% to 45%, mild      mitral regurgitation, mild tricuspid  regurgitation.  6. History of transient ischemic attacks.  7. History of prostate cancer (status post surgery, radiation therapy,      hormonal therapy).  8. This admission electrophysiology consult for PAT.  Discussed      catheter ablation versus medical therapy.      a.     The decision was made for medical therapy for 3-4 weeks to       allow the patient to get well from his admission here.  He will go       home on diltiazem, beta-blocker,  and now Multaq.  On Multaq after       48 hours, no ventricular ectopy noted.   PROCEDURES:  1. A 2-D echocardiogram November 04, 2008, ejection fraction 40% to 45%,      mild mitral regurgitation, mild tricuspid regurgitation.  2. Stress Myoview study November 04, 2008, ejection fraction 40% to 45%,      small inferior wall infarct, peri-infarct ischemia.  3. Diagnostic catheterization November 05, 2008, with finding of high-      grade LAD 80% stenosis after the first septal perforator.  4. Therapeutic catheterization November 05, 2008, with placement of      overlapping bare-metal stents in the LAD with loss of a tiny septal      perforator.  The patient did have ischemic chest pain after the      catheterization and an excursion of troponin- I to 4.86.   BRIEF HISTORY:  Mr. Bulman is a 70 year old male.  He has known coronary  artery disease and a stent was placed in the left circumflex about 10  years ago.  He also has a history of labile hypertension, history of  TIAs, and history of prostate cancer.   The patient has been experiencing 3 weeks of intermittent chest pain and  tachy palpitations.  He is getting short of breath with minimal  exertion.  He has been recently diagnosed with paroxysmal atrial  tachycardia.  At an another hospital, he had an echocardiogram, which  showed ejection fraction of 50%.  He was started on beta-blocker  Lopressor by the outside hospital.  Their concentration was COPD as  origin of his dyspnea.   HOSPITAL  COURSE:  The patient presents on August 23rd with intermittent  chest pain and increasing dyspnea on exertion.  The patient had stress  Myoview study, which showed inferior ischemia and was scheduled for left  heart catheterization that was done on August 25.  The diagnostic cath  showed 80% LAD stenosis after this first septal perforator.  He was then  passed on for a therapeutic catheterization the same day, overlapping  bare-metal stents were placed to the LAD to jailed the septal  perforator, they were bare metal stents because the patient has had  bleeding, epistaxis of a severe nature on Plavix in the past, but he  will require Plavix for 4-week period along with enteric-coated 325 mg  aspirin.  After the therapeutic catheterization, the patient did  experience an overnight of chest pain, which was quite marked.  Cardiac  enzymes were obtained and they were elevated, but this soon returned to  normalcy.  The patient's chest pain was improved with IV morphine and  oral analgesics.  The patient experiencing N-STEMI post therapeutic  cath.  The patient was seen in consultation by Dr. Johney Frame in  Electrophysiology with respect to his paroxysmal atrial tachycardia.  His diltiazem was boosted, he was maintained on beta-blocker, and he was  started on Multaq with careful close monitoring on telemetry.  The  patient showed no evidence of ventricular ectopy on Multaq and fair  amount of suppression of his TAT as well.  At the time of discharge, she  has brief flurries of atrial arrhythmia that are very transient and the  patient is asymptomatic.  The patient is ready for discharge on August  31st.  Once again, his atrial dysrhythmias have been suppressed fairly  effectively.  He is having some bradycardia, but this is mostly  nocturnal and the patient is sleeping without  symptoms of this.  At the  time of this dictation and on review of the chart, there is mention that  a statin medication  would be added; however, he currently is not on the  statin, I will find out if there is some contraindication with the  patient and if not we will start him on a statin at the time of this  discharge.   At this current dictation, he has the following medications:  1. Acetaminophen 325 mg every 4 hours as needed.  2. Enteric-coated aspirin 325 mg daily.  3. Plavix 75 mg daily.  4. Diltiazem 180 mg twice daily.  5. Dronedarone 400 mg twice daily.  6. Metoprolol 25 mg twice daily.  7. Nitroglycerin 0.4 mg 1 tablet sublingually every 5 minutes x3 doses      as needed for chest pain.  8. Clonidine 0.2 mg twice daily.  9. Lisinopril 20 mg twice daily.  10.Centrum Silver daily.  11.The patient is asked to stop taking metoprolol tartrate 50 mg in      the evening and 25 mg in the morning.   Once again, we will investigate statin and probably include that.  Laboratory studies on August 26th, complete blood count, white cells  6.6, hemoglobin 11.8, hematocrit 34.4, platelets are 161.  Serum  electrolytes on August 26th,  sodium 134, potassium 4.1, chloride 104, carbonate 23, BUN 16,  creatinine 0.81, glucose 111.  Protime this admission 15.4, INR is 1.0.  Once again the troponin-I excursion after therapeutic catheterization  4.86 then 4.63 then 3.71.      Maple Mirza, [place]      Hillis Range, MD  Electronically Signed    GM/MEDQ  D:  11/11/2008  T:  11/11/2008  Job:  [id number]   cc:   Noralyn Pick. Eden Emms, MD, [facility]  Georjean Mode, NP

## 2010-07-30 NOTE — Assessment & Plan Note (Signed)
[facility]                                 ON-CALL NOTE   NAME:Jerry Gilbert, Jerry Gilbert                       MRN:          [id number]  DATE:12/29/2009                            DOB:          [birth year]    I was contacted by Mr. [name]' wife regarding mild orthostatic  hypotension.  According to the medical records, the patient has a  history of coronary disease, status post multiple percutaneous coronary  interventions, ischemic cardiomyopathy with an ejection fraction of 40%-  45%, paroxysmal atrial tachycardia, which has been stable on Multaq and  more recently the patient has been having his antihypertensive  medications titrated.  The patient has underlying hypertension and is  maintained on Cardizem, lisinopril, Catapres and Lopressor.  The  patient's wife reports that Dr. Johney Frame recently placed him on  hydrochlorothiazide 2 weeks ago and his blood pressures has been running  with systolics in the 110s.   Today, the patient did heavy manual labor outside working on a gazebo  roof.  He has felt mildly washed out this evening.  His blood pressure  was checked and systolic was 93.  He laid down.  He subsequently had a  systolic blood pressure of 77.  The wife repeated and the systolic blood  pressures increased to 90.  He did have one episode of mild presyncope  earlier this evening; however, he has not had any frank syncope.  He  denies any chest pain, tachy palpitations or heart failure symptoms.  I  recommended that they discontinue the hydrochlorothiazide and follow up  with their primary care Lalisa Kiehn in the morning.  I also gave them the  option of being seen and evaluated in the emergency department and at  this time they would like to try to wait this out and see their primary  care doctor in the morning.     Therisa Doyne, MD    SJT/MedQ  DD: 12/29/2009  DT: 12/29/2009  Job #: [id number]

## 2010-08-18 ENCOUNTER — Encounter: Payer: Self-pay | Admitting: Internal Medicine

## 2010-09-22 ENCOUNTER — Ambulatory Visit (INDEPENDENT_AMBULATORY_CARE_PROVIDER_SITE_OTHER): Payer: 59 | Admitting: Internal Medicine

## 2010-09-22 ENCOUNTER — Encounter: Payer: Self-pay | Admitting: Internal Medicine

## 2010-09-22 DIAGNOSIS — I719 Aortic aneurysm of unspecified site, without rupture: Secondary | ICD-10-CM

## 2010-09-22 DIAGNOSIS — I471 Supraventricular tachycardia: Secondary | ICD-10-CM

## 2010-09-22 DIAGNOSIS — I498 Other specified cardiac arrhythmias: Secondary | ICD-10-CM

## 2010-09-22 DIAGNOSIS — I2510 Atherosclerotic heart disease of native coronary artery without angina pectoris: Secondary | ICD-10-CM

## 2010-09-22 DIAGNOSIS — F17200 Nicotine dependence, unspecified, uncomplicated: Secondary | ICD-10-CM

## 2010-09-22 DIAGNOSIS — I10 Essential (primary) hypertension: Secondary | ICD-10-CM

## 2010-09-22 LAB — CBC WITH DIFFERENTIAL/PLATELET
Basophils Absolute: 0 10*3/uL (ref 0.0–0.1)
Eosinophils Absolute: 0.1 10*3/uL (ref 0.0–0.7)
HCT: 40.6 % (ref 39.0–52.0)
Lymphs Abs: 1.5 10*3/uL (ref 0.7–4.0)
MCHC: 33.5 g/dL (ref 30.0–36.0)
MCV: 89.8 fl (ref 78.0–100.0)
Monocytes Absolute: 0.5 10*3/uL (ref 0.1–1.0)
Neutrophils Relative %: 69.3 % (ref 43.0–77.0)
Platelets: 187 10*3/uL (ref 150.0–400.0)
RDW: 15.6 % — ABNORMAL HIGH (ref 11.5–14.6)

## 2010-09-22 LAB — HEPATIC FUNCTION PANEL
ALT: 10 U/L (ref 0–53)
Total Bilirubin: 0.4 mg/dL (ref 0.3–1.2)

## 2010-09-22 LAB — BASIC METABOLIC PANEL
BUN: 23 mg/dL (ref 6–23)
Chloride: 103 mEq/L (ref 96–112)
Creatinine, Ser: 1 mg/dL (ref 0.4–1.5)
Glucose, Bld: 103 mg/dL — ABNORMAL HIGH (ref 70–99)
Potassium: 3.8 mEq/L (ref 3.5–5.1)

## 2010-09-22 NOTE — Progress Notes (Signed)
The patient presents today for routine electrophysiology followup.  Since last being seen in our clinic, the patient reports doing very well.  He remains active.  Unfortunately, he continues to drink and smoke heavily.  Today, he denies symptoms of palpitations, chest pain, shortness of breath, orthopnea, PND, lower extremity edema, dizziness, presyncope, syncope, or neurologic sequela.  The patient feels that he is tolerating medications without difficulties and is otherwise without complaint today.   Past Medical History  Diagnosis Date  . Atrial tachycardia   . CAD (coronary artery disease)     S/P PCI LAD  . Hypertension   . Personal history of unspecified circulatory disease     Transient Ischemic attacks  . Prostate cancer   . Tobacco abuse   . AAA (abdominal aortic aneurysm) 11/04/09    3.6 cmx 3.4 cm   Past Surgical History  Procedure Date  . Bare-metal stent     Left circumflex about 10 years ago  . Pci of the lad   . Prostate surgery     Current Outpatient Prescriptions  Medication Sig Dispense Refill  . aspirin 81 MG EC tablet Take 81 mg by mouth daily.        . cloNIDine (CATAPRES) 0.2 MG tablet Take 1 tablet (0.2 mg total) by mouth 2 (two) times daily.  60 tablet  4  . clopidogrel (PLAVIX) 75 MG tablet Take 75 mg by mouth daily.        [name] diltiazem (CARDIZEM CD) 180 MG 24 hr capsule Take 180 mg by mouth daily.        [name] dronedarone (MULTAQ) 400 MG tablet Take 400 mg by mouth 2 (two) times daily with a meal.        . lisinopril (PRINIVIL,ZESTRIL) 20 MG tablet Take 20 mg by mouth 2 (two) times daily.        . metoprolol tartrate (LOPRESSOR) 25 MG tablet Take 1 tablet (25 mg total) by mouth 2 (two) times daily.  60 tablet  4  . nitroGLYCERIN (NITROSTAT) 0.4 MG SL tablet Place 0.4 mg under the tongue every 5 (five) minutes as needed. May repeat up to 3 doses.       . Selenium 100 MCG TABS Take 1 tablet by mouth daily.        . hydrochlorothiazide 25 MG tablet Take 25 mg by  mouth daily.          Allergies  Allergen Reactions  . Celecoxib     History   Social History  . Marital Status: Married    Spouse Name: N/A    Number of Children: N/A  . Years of Education: N/A   Occupational History  . Retired    Social History Main Topics  . Smoking status: Current Everyday Smoker -- 1.0 packs/day for 80 years  . Smokeless tobacco: Not on file   Comment: he remains unwilling to quit  . Alcohol Use: Yes     Drinks several shots most nights  . Drug Use: No     No illicit drug use and only takes multivitamin as far as herbal medication goes.  . Sexually Active: Not on file   Other Topics Concern  . Not on file   Social History Narrative   [place], [place] with his wifeRemains active, working around his houseDiet is regular     Family History  Problem Relation Age of Onset  . Heart attack Mother 49  . Coronary artery disease Brother   .  Arrhythmia Brother     S/P cardioversion   Physical Exam: Filed Vitals:   09/22/10 1353  BP: 127/78  Pulse: 58  Height: 5\' 7"  (1.702 m)  Weight: 164 lb (74.39 kg)    GEN- The patient is well appearing, alert and oriented x 3 today.   Head- normocephalic, atraumatic Eyes-  Sclera clear, conjunctiva pink Ears- hearing intact Oropharynx- clear Neck- supple, no JVP Lymph- no cervical lymphadenopathy Lungs- Clear to ausculation bilaterally, normal work of breathing Heart- Regular rate and rhythm, no murmurs, rubs or gallops, PMI not laterally displaced GI- soft, NT, ND, + BS Extremities- no clubbing, cyanosis, or edema MS- no significant deformity or atrophy Skin- no rash or lesion Psych- euthymic mood, full affect Neuro- strength and sensation are intact  ekg today reveals sinus rhythm 58 bpm, LAHB, nonspecific ST/T changes  Assessment and Plan:

## 2010-09-22 NOTE — Assessment & Plan Note (Addendum)
Stable No change required today Check BMET today 

## 2010-09-22 NOTE — Assessment & Plan Note (Addendum)
Stable No ischemic symptoms No changes today Smoking cessation advised  He reports easy bruising,  We will check CBC on plavix today.

## 2010-09-22 NOTE — Assessment & Plan Note (Signed)
Cessation advised He is not ready to quit 

## 2010-09-22 NOTE — Patient Instructions (Signed)
Your physician wants you to follow-up in: 6 months Dr Johney Frame Bonita Quin will receive a reminder letter in the mail two months in advance. If you don't receive a letter, please call our office to schedule the follow-up appointment.  Your physician recommends that you return for lab work today  CBC/BMP/Liver Panel  Your physician has requested that you have an abdominal aorta duplex. During this test, an ultrasound is used to evaluate the aorta. Allow 30 minutes for this exam. Do not eat after midnight the day before and avoid carbonated beverages--in 10/2010

## 2010-09-22 NOTE — Assessment & Plan Note (Signed)
Currently well controlled with multaq We will check LFTs today No changes

## 2010-09-22 NOTE — Assessment & Plan Note (Signed)
Doing well without symptoms We will repeat abdominal US in August.

## 2010-10-27 ENCOUNTER — Encounter: Payer: Medicare Other | Admitting: Cardiology

## 2010-11-03 ENCOUNTER — Other Ambulatory Visit: Payer: Self-pay | Admitting: Internal Medicine

## 2010-11-10 ENCOUNTER — Encounter (INDEPENDENT_AMBULATORY_CARE_PROVIDER_SITE_OTHER): Payer: 59 | Admitting: Cardiology

## 2010-11-10 ENCOUNTER — Other Ambulatory Visit: Payer: Self-pay | Admitting: Cardiology

## 2010-11-10 DIAGNOSIS — I6529 Occlusion and stenosis of unspecified carotid artery: Secondary | ICD-10-CM

## 2010-11-10 DIAGNOSIS — I700 Atherosclerosis of aorta: Secondary | ICD-10-CM

## 2010-11-10 DIAGNOSIS — I714 Abdominal aortic aneurysm, without rupture: Secondary | ICD-10-CM

## 2011-01-03 ENCOUNTER — Other Ambulatory Visit: Payer: Self-pay | Admitting: Internal Medicine

## 2011-01-04 NOTE — Telephone Encounter (Signed)
Pt calling to say he needs refill today will be out by [place]

## 2011-06-02 ENCOUNTER — Other Ambulatory Visit: Payer: Self-pay | Admitting: *Deleted

## 2011-06-02 MED ORDER — LISINOPRIL 20 MG PO TABS: 20.0000 mg | ORAL_TABLET | Freq: Two times a day (BID) | ORAL | Status: DC

## 2011-09-14 ENCOUNTER — Telehealth: Payer: Self-pay | Admitting: Internal Medicine

## 2011-09-14 NOTE — Telephone Encounter (Signed)
Pt recently switched insurance. I called the prior authorization department and Multaq does not have a tier exception.  The only restriction for this medication is it cannot be prescribed for a 90 day supply, they will only authorize a 30 day supply. The pt's medication is $70 dollars for a 90 day supply and the pt was already aware of this cost.  The pt also got a Rx for Matzim [place] and this is a tier 3 medication.  Per the insurance company Diltiazem ER 180 mg is a Tier 1 medication and would be cheaper for the pt.  The pt has enough Matzim LA to last until 10/01/11.  The pt would like to know if Dr Johney Frame would switch this medication due to cost (both Matzim and Diltiazem are listed on the pt's medication list). I will forward this message to Dennis Bast RN to discuss with Dr Johney Frame when they are in the office next week.

## 2011-09-14 NOTE — Telephone Encounter (Signed)
New msg Pt was calling about prior auth for Multaq and Matzim thru Part D for tier exception The phone number is [phone] id number [id number]

## 2011-09-29 MED ORDER — DILTIAZEM HCL ER COATED BEADS 180 MG PO CP24: 180.0000 mg | ORAL_CAPSULE | Freq: Every day | ORAL | Status: DC

## 2011-09-29 NOTE — Telephone Encounter (Signed)
Okay to change to Diltiazem per Dr Johney Frame

## 2011-10-31 ENCOUNTER — Other Ambulatory Visit: Payer: Self-pay | Admitting: Internal Medicine

## 2011-11-30 ENCOUNTER — Ambulatory Visit (INDEPENDENT_AMBULATORY_CARE_PROVIDER_SITE_OTHER): Payer: 59 | Admitting: Internal Medicine

## 2011-11-30 ENCOUNTER — Encounter: Payer: Self-pay | Admitting: Internal Medicine

## 2011-11-30 ENCOUNTER — Other Ambulatory Visit: Payer: Self-pay

## 2011-11-30 ENCOUNTER — Encounter (INDEPENDENT_AMBULATORY_CARE_PROVIDER_SITE_OTHER): Payer: BC Managed Care – HMO

## 2011-11-30 VITALS — BP 146/92 | HR 57 | Ht 71.0 in | Wt 168.0 lb

## 2011-11-30 DIAGNOSIS — I2510 Atherosclerotic heart disease of native coronary artery without angina pectoris: Secondary | ICD-10-CM

## 2011-11-30 DIAGNOSIS — F17200 Nicotine dependence, unspecified, uncomplicated: Secondary | ICD-10-CM

## 2011-11-30 DIAGNOSIS — I714 Abdominal aortic aneurysm, without rupture: Secondary | ICD-10-CM

## 2011-11-30 DIAGNOSIS — I10 Essential (primary) hypertension: Secondary | ICD-10-CM

## 2011-11-30 DIAGNOSIS — R002 Palpitations: Secondary | ICD-10-CM

## 2011-11-30 DIAGNOSIS — R42 Dizziness and giddiness: Secondary | ICD-10-CM | POA: Insufficient documentation

## 2011-11-30 DIAGNOSIS — I498 Other specified cardiac arrhythmias: Secondary | ICD-10-CM

## 2011-11-30 DIAGNOSIS — R0602 Shortness of breath: Secondary | ICD-10-CM | POA: Insufficient documentation

## 2011-11-30 DIAGNOSIS — I719 Aortic aneurysm of unspecified site, without rupture: Secondary | ICD-10-CM

## 2011-11-30 DIAGNOSIS — I471 Supraventricular tachycardia: Secondary | ICD-10-CM

## 2011-11-30 MED ORDER — LISINOPRIL 20 MG PO TABS: 20.0000 mg | ORAL_TABLET | Freq: Two times a day (BID) | ORAL | Status: DC

## 2011-11-30 MED ORDER — HYDROCHLOROTHIAZIDE 12.5 MG PO CAPS: 12.5000 mg | ORAL_CAPSULE | Freq: Every day | ORAL | Status: DC

## 2011-11-30 NOTE — Patient Instructions (Addendum)
Your physician recommends that you schedule a follow-up appointment in: 3 months with Sunday Spillers and 6 months with Dr Johney Frame   Your physician has requested that you have an abdominal aorta duplex. During this test, an ultrasound is used to evaluate the aorta. Allow 30 minutes for this exam. Do not eat after midnight the day before and avoid carbonated beverages  Your physician has requested that you have en exercise stress myoview. For further information please visit [email or web address] Please follow instruction sheet, as given.  Your physician has recommended that you wear an event monitor. Event monitors are medical devices that record the heart's electrical activity. Doctors most often [place] these monitors to diagnose arrhythmias. Arrhythmias are problems with the speed or rhythm of the heartbeat. The monitor is a small, portable device. You can wear one while you do your normal daily activities. This is usually used to diagnose what is causing palpitations/syncope (passing out).  Your physician recommends that you return for lab work same day as stress test: Lipid/Liver/BMP  Your physician has recommended you make the following change in your medication:  1) Start HCTZ 12.5mg   daily

## 2011-11-30 NOTE — Progress Notes (Signed)
PCP: none currently  The patient presents today for routine cardiology followup.  Since last being seen in our clinic, the patient reports doing very well.  He remains active.  Unfortunately, he continues to drink and smoke heavily.  He has had several episodes of dizziness.  During these episodes his wife has checked his BP and found it to be 200s/ 90s  He denies syncope.  She does not think that his pulse was elevated.  He reports progressive SOB with exertion over the past few weeks.  This occurs when walking more than 100 ft and is new.  Today, he denies symptoms of palpitations, chest pain, orthopnea, PND, lower extremity edema,  syncope, or neurologic sequela.  The patient feels that he is tolerating medications without difficulties and is otherwise without complaint today.   Past Medical History  Diagnosis Date  . Atrial tachycardia   . CAD (coronary artery disease)     S/P PCI LAD  . Hypertension   . Personal history of unspecified circulatory disease     Transient Ischemic attacks  . Prostate cancer   . Tobacco abuse   . AAA (abdominal aortic aneurysm) 11/04/09    3.6 cmx 3.4 cm   Past Surgical History  Procedure Date  . Bare-metal stent     Left circumflex about 10 years ago  . Pci of the lad   . Prostate surgery     Current Outpatient Prescriptions  Medication Sig Dispense Refill  . aspirin 81 MG EC tablet Take 81 mg by mouth daily.        . cloNIDine (CATAPRES) 0.2 MG tablet TAKE ONE TABLET BY MOUTH TWICE DAILY  60 tablet  11  . clopidogrel (PLAVIX) 75 MG tablet TAKE ONE TABLET BY MOUTH EVERY DAY  30 tablet  1  . diltiazem (CARDIZEM CD) 180 MG 24 hr capsule Take 1 capsule (180 mg total) by mouth daily.  90 capsule  11  . metoprolol tartrate (LOPRESSOR) 25 MG tablet TAKE ONE TABLET BY MOUTH TWICE DAILY  60 tablet  11  . MULTAQ 400 MG tablet TAKE ONE TABLET BY MOUTH TWICE DAILY  60 tablet  1  . nitroGLYCERIN (NITROSTAT) 0.4 MG SL tablet Place 0.4 mg under the tongue every 5  (five) minutes as needed. May repeat up to 3 doses.       . Selenium 100 MCG TABS Take 1 tablet by mouth daily.        [name] DISCONTD: lisinopril (PRINIVIL,ZESTRIL) 20 MG tablet Take 1 tablet (20 mg total) by mouth 2 (two) times daily.  60 tablet  5  . hydrochlorothiazide (MICROZIDE) 12.5 MG capsule Take 1 capsule (12.5 mg total) by mouth daily.  90 capsule  3  . lisinopril (PRINIVIL,ZESTRIL) 20 MG tablet Take 1 tablet (20 mg total) by mouth 2 (two) times daily.  60 tablet  5    Allergies  Allergen Reactions  . Celecoxib     History   Social History  . Marital Status: Married    Spouse Name: N/A    Number of Children: N/A  . Years of Education: N/A   Occupational History  . Retired    Social History Main Topics  . Smoking status: Current Every Day Smoker -- 1.0 packs/day for 80 years  . Smokeless tobacco: Not on file   Comment: he states that he is trying to quit  . Alcohol Use: Yes     Drinks several shots most nights  . Drug Use: No  No illicit drug use and only takes multivitamin as far as herbal medication goes.  . Sexually Active: Not on file   Other Topics Concern  . Not on file   Social History Narrative   [place]ives in [place], [place] with his wifeRemains active, working around his houseDiet is regular     Family History  Problem Relation Age of Onset  . Heart attack Mother 62  . Coronary artery disease Brother   . Arrhythmia Brother     S/P cardioversion   Physical Exam: Filed Vitals:   11/30/11 1434 11/30/11 1436 11/30/11 1438 11/30/11 1439  BP: 146/90 146/90 148/91 146/92  Pulse: 55 55 56 57  Height: 5\' 11"  (1.803 m)   5\' 11"  (1.803 m)  Weight: 168 lb (76.204 kg)   168 lb (76.204 kg)    GEN- The patient is well appearing, alert and oriented x 3 today.   Head- normocephalic, atraumatic Eyes-  Sclera clear, conjunctiva pink Ears- hearing intact Oropharynx- clear Lungs- Clear to ausculation bilaterally, normal work of breathing Heart- Regular rate  and rhythm, no murmurs, rubs or gallops, PMI not laterally displaced GI- soft, NT, ND, + BS, aneurysm is not palpable today Extremities- no clubbing, cyanosis, or edema MS- no significant deformity or atrophy Skin- no rash or lesion Psych- euthymic mood, full affect Neuro- strength and sensation are intact  ekg today reveals sinus rhythm 54 bpm, LAHB, nonspecific ST/T changes Abdominal US from 8/12 and carotid dopplers reviewed  Assessment and Plan:  Atrial tachycardia   A possible cause for dizziness and SOB Place event monitor to evaluate for atrial tach Continue multaq, metoprolol and diltiazem Check LFTs  CAD  Exertional SOB is worrisome for progression of CAD Obtain a lexiscan myoview Check fasting lipids and liver profile Smoking cessation advised   TOBACCO ABUSE  Cessation advised  He is finally trying to quit. Given exertional SOB, I have encouraged him to obtain a PCP to order PFTs  HYPERTENSION  Above goal Recent dizziness occurs with elevated BP Add hctz 12.5mg  daily (he did not tolerate 25mg  daily previously) Check   Aortic aneurysm  Doing well without symptoms  We will repeat abdominal US to evaluate for interval change BP control  Dizziness Unclear etiology  Possibly due to elevated BP Add hctz as above Place event monitor as above

## 2011-12-01 ENCOUNTER — Telehealth: Payer: Self-pay | Admitting: Internal Medicine

## 2011-12-01 NOTE — Telephone Encounter (Signed)
Patient has an event monitor placed yesterday.  wife states pt had an episode on the [place] and was not able to capture that, because pt already had 3 episode in a row. Patient would like to know it the events to recorded can be increase to more then 3 events  at the time. Marcos Eke states that pt and wife were instructed that after 3 events, it needed to be downloaded in the [place] phone before it can record the 4 th one, and the events monitor pt has does not aloud to record increase more then 3 events at the time. Patient verbalized understanding.

## 2011-12-01 NOTE — Telephone Encounter (Signed)
Pt's wife has question re increasing events on his monitor, pls call [phone] or [phone]

## 2011-12-01 NOTE — Telephone Encounter (Signed)
Left pt a message to call back. 

## 2011-12-01 NOTE — Telephone Encounter (Signed)
Pt needs refill of linsinopril @ [facility] [place], said pharmacy requested twice, pt out needs asap

## 2011-12-19 ENCOUNTER — Ambulatory Visit (HOSPITAL_COMMUNITY): Payer: Medicare Other | Attending: Cardiology | Admitting: Radiology

## 2011-12-19 ENCOUNTER — Other Ambulatory Visit (INDEPENDENT_AMBULATORY_CARE_PROVIDER_SITE_OTHER): Payer: Medicare Other

## 2011-12-19 ENCOUNTER — Other Ambulatory Visit (HOSPITAL_COMMUNITY): Payer: Self-pay | Admitting: *Deleted

## 2011-12-19 ENCOUNTER — Encounter (INDEPENDENT_AMBULATORY_CARE_PROVIDER_SITE_OTHER): Payer: Medicare Other

## 2011-12-19 VITALS — BP 92/64 | HR 49 | Ht 71.0 in | Wt 170.0 lb

## 2011-12-19 DIAGNOSIS — R002 Palpitations: Secondary | ICD-10-CM

## 2011-12-19 DIAGNOSIS — I739 Peripheral vascular disease, unspecified: Secondary | ICD-10-CM | POA: Insufficient documentation

## 2011-12-19 DIAGNOSIS — F17200 Nicotine dependence, unspecified, uncomplicated: Secondary | ICD-10-CM | POA: Insufficient documentation

## 2011-12-19 DIAGNOSIS — I2510 Atherosclerotic heart disease of native coronary artery without angina pectoris: Secondary | ICD-10-CM

## 2011-12-19 DIAGNOSIS — R0602 Shortness of breath: Secondary | ICD-10-CM

## 2011-12-19 DIAGNOSIS — R0789 Other chest pain: Secondary | ICD-10-CM | POA: Insufficient documentation

## 2011-12-19 DIAGNOSIS — I714 Abdominal aortic aneurysm, without rupture: Secondary | ICD-10-CM

## 2011-12-19 DIAGNOSIS — R000 Tachycardia, unspecified: Secondary | ICD-10-CM | POA: Insufficient documentation

## 2011-12-19 DIAGNOSIS — I10 Essential (primary) hypertension: Secondary | ICD-10-CM

## 2011-12-19 DIAGNOSIS — Z8249 Family history of ischemic heart disease and other diseases of the circulatory system: Secondary | ICD-10-CM | POA: Insufficient documentation

## 2011-12-19 DIAGNOSIS — R55 Syncope and collapse: Secondary | ICD-10-CM | POA: Insufficient documentation

## 2011-12-19 DIAGNOSIS — I4949 Other premature depolarization: Secondary | ICD-10-CM

## 2011-12-19 DIAGNOSIS — R079 Chest pain, unspecified: Secondary | ICD-10-CM

## 2011-12-19 LAB — HEPATIC FUNCTION PANEL
Albumin: 3.5 g/dL (ref 3.5–5.2)
Alkaline Phosphatase: 83 U/L (ref 39–117)
Total Bilirubin: 0.5 mg/dL (ref 0.3–1.2)

## 2011-12-19 LAB — BASIC METABOLIC PANEL
CO2: 28 mEq/L (ref 19–32)
Chloride: 93 mEq/L — ABNORMAL LOW (ref 96–112)
Creatinine, Ser: 1 mg/dL (ref 0.4–1.5)
Glucose, Bld: 103 mg/dL — ABNORMAL HIGH (ref 70–99)

## 2011-12-19 LAB — LIPID PANEL
Cholesterol: 237 mg/dL — ABNORMAL HIGH (ref 0–200)
Total CHOL/HDL Ratio: 7
VLDL: 35.6 mg/dL (ref 0.0–40.0)

## 2011-12-19 MED ORDER — TECHNETIUM TC 99M SESTAMIBI GENERIC - CARDIOLITE
10.0000 | Freq: Once | INTRAVENOUS | Status: AC | PRN
  Administered 2011-12-19: 10 via INTRAVENOUS

## 2011-12-19 MED ORDER — REGADENOSON 0.4 MG/5ML IV SOLN
0.4000 mg | Freq: Once | INTRAVENOUS | Status: AC
  Administered 2011-12-19: 0.4 mg via INTRAVENOUS

## 2011-12-19 MED ORDER — TECHNETIUM TC 99M SESTAMIBI GENERIC - CARDIOLITE
30.0000 | Freq: Once | INTRAVENOUS | Status: AC | PRN
  Administered 2011-12-19: 30 via INTRAVENOUS

## 2011-12-19 NOTE — Progress Notes (Signed)
[facility] SITE 3 NUCLEAR MED [address] [id number] [place] [address] [phone]  Cardiology Nuclear Med Study  Jerry Gilbert is a 71 y.o. male     MRN : [id number]     DOB: 12/01/1940  Procedure Date: 12/19/2011  Nuclear Med Background Indication for Stress Test:  Evaluation for Ischemia and PTCA/Stent Patency History:  ~'08 GXT:OK per patient; '10 Stent-LAD; '10 Echo:EF=45%; '11 MPS:No ischemia, EF=51%; h/o PAF Cardiac Risk Factors: Family History - CAD, Hypertension, PVD, Smoker and TIA  Symptoms:  Chest Pressure.  (last episode of chest discomfort was about one month ago), Diaphoresis, Dizziness/Light-Headedness/Near Syncope, SOB/DOE and Rapid HR    Nuclear Pre-Procedure Caffeine/Decaff Intake:  None NPO After: 9:30pm   Lungs:  Clear. O2 Sat: 96% on room air. IV 0.9% NS with Angio Cath:  22g  IV Site: R Antecubital  IV Started by:  Stanton Kidney, EMT-P  Chest Size (in):  42 Cup Size: n/a  Height: 5\' 11"  (1.803 m)  Weight:  170 lb (77.111 kg)  BMI:  Body mass index is 23.71 kg/(m^2). Tech Comments:  Med's as directed    Nuclear Med Study 1 or 2 day study: 1 day  Stress Test Type:  Lexiscan  Reading MD: Willa Rough, MD  Order Authorizing Provider:  Hillis Range, MD  Resting Radionuclide: Technetium 26m Sestamibi  Resting Radionuclide Dose: 11.0 mCi   Stress Radionuclide:  Technetium 43m Sestamibi  Stress Radionuclide Dose: 33.0 mCi           Stress Protocol Rest HR: 49 Stress HR: 75  Rest BP: 92/64 Stress BP: 105/66  Exercise Time (min): n/a METS: n/a   Predicted Max HR: 150 bpm % Max HR: 50 bpm Rate Pressure Product: 7875   Dose of Adenosine (mg):  n/a Dose of Lexiscan: 0.4 mg  Dose of Atropine (mg): n/a Dose of Dobutamine: n/a mcg/kg/min (at max HR)  Stress Test Technologist: Smiley Houseman, CMA-N  Nuclear Technologist:  Domenic Polite, CNMT     Rest Procedure:  Myocardial perfusion imaging was performed at rest 45 minutes  following the intravenous administration of Technetium 92m Sestamibi.  Rest ECG: Nonspecific T-wave change with occasional PVC.  Stress Procedure: The patient was hypotensive prior to [facility] and was given 250 cc of normal saline prior to Lexiscan bolus.   He then received IV Lexiscan 0.4 mg over 15-seconds.  Technetium 20m Sestamibi was injected at 30-seconds.  There were no significant changes with Lexiscan; rare PAC was noted.  He did c/o chest tightness with Lexiscan.  Quantitative spect images were obtained after a 45 minute delay.  Stress ECG: No significant change from baseline ECG  QPS Raw Data Images:  Patient motion noted. Stress Images:  There is moderate decreased activity in a very small area affecting only the apical cap. Rest Images:  The rest images are the same as the stress images. Subtraction (SDS):  No evidence of ischemia. Transient Ischemic Dilatation (Normal <1.22):  1.12 Lung/Heart Ratio (Normal <0.45):  0.36  Quantitative Gated Spect Images QGS EDV:  87 ml QGS ESV:  36 ml  Impression Exercise Capacity:  Lexiscan with no exercise. BP Response:  Normal blood pressure response. Clinical Symptoms:  shortness of breath ECG Impression:  No significant ST segment change suggestive of ischemia. Comparison with Prior Nuclear Study: No images to compare  Overall Impression:  There may be a small area of scar affecting only the apical cap. Otherwise there no definite abnormalities and there is  no definite ischemia.  LV Ejection Fraction: 58%.  LV Wall Motion:  There are no significant wall abnormalities.  Willa Rough, MD

## 2011-12-28 ENCOUNTER — Telehealth: Payer: Self-pay | Admitting: Internal Medicine

## 2011-12-28 NOTE — Telephone Encounter (Signed)
New Problem:    Patient called returning a call about her husbands blood work results.  Please call back after 3:30pm.

## 2011-12-28 NOTE — Telephone Encounter (Signed)
Wife aware and will call with the monitor results

## 2011-12-30 ENCOUNTER — Telehealth: Payer: Self-pay | Admitting: Internal Medicine

## 2011-12-30 MED ORDER — DILTIAZEM HCL ER COATED BEADS 180 MG PO CP24: 180.0000 mg | ORAL_CAPSULE | Freq: Every day | ORAL | Status: DC

## 2011-12-30 MED ORDER — METOPROLOL TARTRATE 25 MG PO TABS: 25.0000 mg | ORAL_TABLET | Freq: Two times a day (BID) | ORAL | Status: DC

## 2011-12-30 MED ORDER — CLOPIDOGREL BISULFATE 75 MG PO TABS: 75.0000 mg | ORAL_TABLET | Freq: Every day | ORAL | Status: DC

## 2011-12-30 MED ORDER — CLONIDINE HCL 0.2 MG PO TABS: 0.2000 mg | ORAL_TABLET | Freq: Two times a day (BID) | ORAL | Status: DC

## 2011-12-30 MED ORDER — LISINOPRIL 20 MG PO TABS: 20.0000 mg | ORAL_TABLET | Freq: Two times a day (BID) | ORAL | Status: DC

## 2011-12-30 MED ORDER — HYDROCHLOROTHIAZIDE 12.5 MG PO CAPS: 12.5000 mg | ORAL_CAPSULE | Freq: Every day | ORAL | Status: DC

## 2011-12-30 MED ORDER — DRONEDARONE HCL 400 MG PO TABS: 400.0000 mg | ORAL_TABLET | Freq: Two times a day (BID) | ORAL | Status: DC

## 2011-12-30 NOTE — Telephone Encounter (Signed)
Pt's wife calling re rx's not called in , [facility] [place] requested by fax twice, almost out of some and needs called in asap for lisinopril, multaq, diltiazem, lisinopril, clonidine, plavix

## 2012-01-02 NOTE — Telephone Encounter (Signed)
lmom with results of normal heart monitor.

## 2012-01-17 ENCOUNTER — Telehealth: Payer: Self-pay | Admitting: Internal Medicine

## 2012-01-17 NOTE — Telephone Encounter (Signed)
New Problem:    Patient's wife called in wanting to speak with someone about her husbands medication and about having orders placed for her husband to have a sodium test.  Please call back at the home number or the cell number [phone].

## 2012-01-17 NOTE — Telephone Encounter (Signed)
Attempted to call pt back with provided numbers.  Both numbers were non-functional.

## 2012-01-24 ENCOUNTER — Telehealth: Payer: Self-pay | Admitting: Internal Medicine

## 2012-01-24 NOTE — Telephone Encounter (Signed)
lmom for patient's wife to call me back 

## 2012-01-24 NOTE — Telephone Encounter (Signed)
Pt having off and on chest pressure and on a new med, wife wants to talk with nurse

## 2012-02-16 NOTE — Telephone Encounter (Signed)
I have left several messages for wife to return my call

## 2012-02-27 ENCOUNTER — Ambulatory Visit: Payer: 59 | Admitting: Nurse Practitioner

## 2012-09-17 ENCOUNTER — Telehealth: Payer: Self-pay | Admitting: Internal Medicine

## 2012-09-17 NOTE — Telephone Encounter (Signed)
New Problem  Pt wants to know if we have any samples of MULTAQ 400 mg or coupons.  She said that they have called the pharmacy and it has become very expensive.

## 2012-09-17 NOTE — Telephone Encounter (Signed)
18 day supply of Multaq placed at the front desk for pick-up.  The pt is also over due for appointment with Dr Johney Frame.  Appointment scheduled on 10/24/12.

## 2012-10-24 ENCOUNTER — Encounter: Payer: Self-pay | Admitting: Internal Medicine

## 2012-10-24 ENCOUNTER — Ambulatory Visit (INDEPENDENT_AMBULATORY_CARE_PROVIDER_SITE_OTHER): Payer: Medicare Other | Admitting: Internal Medicine

## 2012-10-24 VITALS — BP 120/78 | HR 56 | Ht 71.0 in | Wt 173.8 lb

## 2012-10-24 DIAGNOSIS — I719 Aortic aneurysm of unspecified site, without rupture: Secondary | ICD-10-CM

## 2012-10-24 DIAGNOSIS — I739 Peripheral vascular disease, unspecified: Secondary | ICD-10-CM

## 2012-10-24 DIAGNOSIS — I471 Supraventricular tachycardia: Secondary | ICD-10-CM

## 2012-10-24 DIAGNOSIS — R42 Dizziness and giddiness: Secondary | ICD-10-CM

## 2012-10-24 DIAGNOSIS — I10 Essential (primary) hypertension: Secondary | ICD-10-CM

## 2012-10-24 DIAGNOSIS — I498 Other specified cardiac arrhythmias: Secondary | ICD-10-CM

## 2012-10-24 DIAGNOSIS — I2510 Atherosclerotic heart disease of native coronary artery without angina pectoris: Secondary | ICD-10-CM

## 2012-10-24 DIAGNOSIS — F17200 Nicotine dependence, unspecified, uncomplicated: Secondary | ICD-10-CM

## 2012-10-24 LAB — BASIC METABOLIC PANEL
BUN: 13 mg/dL (ref 6–23)
Calcium: 8.8 mg/dL (ref 8.4–10.5)
GFR: 72.4 mL/min (ref >60.00)
Glucose, Bld: 92 mg/dL (ref 70–99)
Potassium: 3.8 mEq/L (ref 3.5–5.1)

## 2012-10-24 LAB — HEPATIC FUNCTION PANEL
ALT: 18 U/L (ref 0–53)
AST: 13 U/L (ref 0–37)
Bilirubin, Direct: 0 mg/dL (ref 0.0–0.3)
Total Bilirubin: 0.6 mg/dL (ref 0.3–1.2)
Total Protein: 6.9 g/dL (ref 6.0–8.3)

## 2012-10-24 MED ORDER — NITROGLYCERIN 0.4 MG SL SUBL: 0.4000 mg | SUBLINGUAL_TABLET | SUBLINGUAL | Status: DC | PRN

## 2012-10-24 NOTE — Progress Notes (Signed)
PCP: none currently  The patient presents today for routine cardiology followup.  Since last being seen in our clinic, the patient reports doing very well.  He remains active. He is fishing every day.  Unfortunately, he continues to drink and smoke.  He now smokes only 1/2 PPD.  His dizziness has resolved and exercise tolerance improved. Today, he denies symptoms of palpitations, chest pain, orthopnea, PND, lower extremity edema,  syncope, or neurologic sequela.  The patient feels that he is tolerating medications without difficulties and is otherwise without complaint today.   Past Medical History  Diagnosis Date  . Atrial tachycardia   . CAD (coronary artery disease)     S/P PCI LAD  . Hypertension   . Personal history of unspecified circulatory disease     Transient Ischemic attacks  . Prostate cancer   . Tobacco abuse   . AAA (abdominal aortic aneurysm) 11/04/09    3.6 cmx 3.4 cm   Past Surgical History  Procedure Laterality Date  . Bare-metal stent      Left circumflex about 10 years ago  . Pci of the lad    . Prostate surgery      Current Outpatient Prescriptions  Medication Sig Dispense Refill  . aspirin 81 MG EC tablet Take 81 mg by mouth daily.        . cloNIDine (CATAPRES) 0.2 MG tablet Take 1 tablet (0.2 mg total) by mouth 2 (two) times daily.  180 tablet  3  . clopidogrel (PLAVIX) 75 MG tablet Take 1 tablet (75 mg total) by mouth daily.  90 tablet  3  . diltiazem (CARDIZEM CD) 180 MG 24 hr capsule Take 1 capsule (180 mg total) by mouth daily.  90 capsule  3  . dronedarone (MULTAQ) 400 MG tablet Take 1 tablet (400 mg total) by mouth 2 (two) times daily with a meal.  180 tablet  3  . hydrochlorothiazide (MICROZIDE) 12.5 MG capsule Take 1 capsule (12.5 mg total) by mouth daily.  90 capsule  3  . lisinopril (PRINIVIL,ZESTRIL) 20 MG tablet Take 1 tablet (20 mg total) by mouth 2 (two) times daily.  180 tablet  3  . metoprolol tartrate (LOPRESSOR) 25 MG tablet Take 1 tablet (25  mg total) by mouth 2 (two) times daily.  180 tablet  3  . Selenium 100 MCG TABS Take 1 tablet by mouth daily.        . nitroGLYCERIN (NITROSTAT) 0.4 MG SL tablet Place 0.4 mg under the tongue every 5 (five) minutes as needed. May repeat up to 3 doses.        No current facility-administered medications for this visit.    Allergies  Allergen Reactions  . Celecoxib     History   Social History  . Marital Status: Married    Spouse Name: N/A    Number of Children: N/A  . Years of Education: N/A   Occupational History  . Retired    Social History Main Topics  . Smoking status: Current Every Day Smoker -- 1.00 packs/day for 80 years  . Smokeless tobacco: Not on file     Comment: he states that he is trying to quit  . Alcohol Use: Yes     Comment: Drinks several shots most nights  . Drug Use: No     Comment: No illicit drug use and only takes multivitamin as far as herbal medication goes.  . Sexual Activity: Not on file   Other Topics Concern  .  Not on file   Social History Narrative   Lives in [place], [place] with his wife   Remains active, working around his house   Diet is regular     Family History  Problem Relation Age of Onset  . Heart attack Mother 66  . Coronary artery disease Brother   . Arrhythmia Brother     S/P cardioversion   Physical Exam: Filed Vitals:   10/24/12 1202  BP: 120/78  Pulse: 56  Height: 5\' 11"  (1.803 m)  Weight: 173 lb 12.8 oz (78.835 kg)    GEN- The patient is well appearing, alert and oriented x 3 today.   Head- normocephalic, atraumatic Eyes-  Sclera clear, conjunctiva pink Ears- hearing intact Oropharynx- clear Lungs- Clear to ausculation bilaterally, normal work of breathing Heart- Regular rate and rhythm, no murmurs, rubs or gallops, PMI not laterally displaced GI- soft, NT, ND, + BS, aneurysm is not palpable today Extremities- no clubbing, cyanosis, or edema  ekg today reveals sinus rhythm 56 bpm, LAHB    Assessment and Plan:  Atrial tachycardia   Well controlled No changes Check LFTs on multaq  CAD  myoview 2013 is reviwed and normal  TOBACCO ABUSE  Cessation advised  He is finally trying to quit.  HYPERTENSION  At goal No changes Check bmet today  Aortic aneurysm  Doing well without symptoms  We will repeat abdominal US to evaluate for interval change BP control  Dizziness Resolved  Return in 1 year

## 2012-10-24 NOTE — Addendum Note (Signed)
Addended by: Freddi Starr on: 10/24/2012 12:48 PM   Modules accepted: Orders

## 2012-10-24 NOTE — Patient Instructions (Addendum)
Your physician wants you to follow-up in: ONE YEAR WITH DR Jacquiline Doe will receive a reminder letter in the mail two months in advance. If you don't receive a letter, please call our office to schedule the follow-up appointment.   Your physician has requested that you have an abdominal aorta duplex. During this test, an ultrasound is used to evaluate the aorta. Allow 30 minutes for this exam. Do not eat after midnight the day before and avoid carbonated beverages   Your physician recommends that you HAVE LAB WORK TODAY

## 2012-10-26 ENCOUNTER — Telehealth: Payer: Self-pay | Admitting: Internal Medicine

## 2012-10-26 NOTE — Telephone Encounter (Signed)
New Problem   Calling for a result on an ultrasound that was done a year ago.

## 2012-10-26 NOTE — Telephone Encounter (Signed)
Patient's wife called to get the Abdominal aortic aneurysm duplex  results done on 12/19/11. results given to pt's wife [name]. She was made aware that the results were given to pt last year. Wife asked to let  Dr. Johney Frame know regarding this, because pt was seen on 10/24/12 , and Dr. Johney Frame could not find any documentation of this test. [name] was  aware that I was giving the results of the Abdominal ultrasound from the pt's chart.

## 2012-11-01 ENCOUNTER — Telehealth: Payer: Self-pay | Admitting: Internal Medicine

## 2012-11-01 NOTE — Telephone Encounter (Signed)
Patient aware of lab results.  He does not have a PCP

## 2012-11-01 NOTE — Telephone Encounter (Signed)
New problem ° ° °Pt stated he is returning your call.  °

## 2012-11-16 ENCOUNTER — Encounter (INDEPENDENT_AMBULATORY_CARE_PROVIDER_SITE_OTHER): Payer: Medicare Other

## 2012-11-16 DIAGNOSIS — I2510 Atherosclerotic heart disease of native coronary artery without angina pectoris: Secondary | ICD-10-CM

## 2012-11-16 DIAGNOSIS — I10 Essential (primary) hypertension: Secondary | ICD-10-CM

## 2012-11-16 DIAGNOSIS — I700 Atherosclerosis of aorta: Secondary | ICD-10-CM

## 2012-11-16 DIAGNOSIS — I471 Supraventricular tachycardia: Secondary | ICD-10-CM

## 2012-11-16 DIAGNOSIS — F17200 Nicotine dependence, unspecified, uncomplicated: Secondary | ICD-10-CM

## 2012-11-16 DIAGNOSIS — I719 Aortic aneurysm of unspecified site, without rupture: Secondary | ICD-10-CM

## 2012-11-16 DIAGNOSIS — I714 Abdominal aortic aneurysm, without rupture: Secondary | ICD-10-CM

## 2012-11-16 DIAGNOSIS — R42 Dizziness and giddiness: Secondary | ICD-10-CM

## 2012-11-16 DIAGNOSIS — I739 Peripheral vascular disease, unspecified: Secondary | ICD-10-CM

## 2012-11-16 NOTE — Telephone Encounter (Signed)
New Problem  Pt trying to see if he could pick up Multaq samples// they are in town from [place] it would be convenient to pick up today. Also request a sodium level test.

## 2012-11-23 NOTE — Telephone Encounter (Signed)
Samples for 30 days given to patient

## 2012-11-23 NOTE — Telephone Encounter (Signed)
F/up  Pt calling to check on samples multaq

## 2012-12-06 ENCOUNTER — Other Ambulatory Visit: Payer: Self-pay | Admitting: Internal Medicine

## 2012-12-12 ENCOUNTER — Other Ambulatory Visit: Payer: Self-pay | Admitting: Internal Medicine

## 2013-01-17 ENCOUNTER — Other Ambulatory Visit: Payer: Self-pay

## 2013-01-17 ENCOUNTER — Other Ambulatory Visit: Payer: Self-pay | Admitting: Internal Medicine

## 2013-01-17 ENCOUNTER — Telehealth: Payer: Self-pay

## 2013-01-17 MED ORDER — DRONEDARONE HCL 400 MG PO TABS: 400.0000 mg | ORAL_TABLET | Freq: Two times a day (BID) | ORAL | Status: DC

## 2013-01-17 NOTE — Telephone Encounter (Signed)
Patient called wanting samples of Multag, place samples up front

## 2013-02-18 ENCOUNTER — Telehealth: Payer: Self-pay | Admitting: Internal Medicine

## 2013-02-18 NOTE — Telephone Encounter (Signed)
Needs some samples of Multaq

## 2013-02-18 NOTE — Telephone Encounter (Signed)
New  Message    Talk to nurse about multaq

## 2013-02-19 ENCOUNTER — Other Ambulatory Visit: Payer: Self-pay | Admitting: *Deleted

## 2013-02-19 NOTE — Telephone Encounter (Signed)
Samples out front for patient

## 2013-02-20 NOTE — Telephone Encounter (Signed)
Patient aware and his brother-in-law will pick up

## 2013-02-27 ENCOUNTER — Telehealth: Payer: Self-pay | Admitting: Internal Medicine

## 2013-02-27 NOTE — Telephone Encounter (Signed)
Spoke with patient let him know I did not call

## 2013-02-27 NOTE — Telephone Encounter (Signed)
New message    Pt states "someone" called him.  Did you call him?

## 2013-03-01 ENCOUNTER — Telehealth: Payer: Self-pay | Admitting: Internal Medicine

## 2013-03-01 MED ORDER — DRONEDARONE HCL 400 MG PO TABS: 400.0000 mg | ORAL_TABLET | Freq: Two times a day (BID) | ORAL | Status: DC

## 2013-03-01 NOTE — Telephone Encounter (Signed)
Spoke with wife and let her know the RX was changed and sent in correctly  I have given them samples

## 2013-03-01 NOTE — Telephone Encounter (Signed)
New Message  Pt wife called. She believes that the script received was not correct// she states that the pt takes Multaq twice a day.. script was prescribed for a months supply of 30 vs 60. Please correct or call back to discuss.

## 2013-03-19 ENCOUNTER — Other Ambulatory Visit: Payer: Self-pay | Admitting: Internal Medicine

## 2013-03-21 ENCOUNTER — Other Ambulatory Visit: Payer: Self-pay

## 2013-03-21 MED ORDER — LISINOPRIL 20 MG PO TABS: ORAL_TABLET | ORAL | Status: DC

## 2013-03-21 MED ORDER — CLONIDINE HCL 0.2 MG PO TABS: ORAL_TABLET | ORAL | Status: DC

## 2013-03-21 MED ORDER — METOPROLOL TARTRATE 25 MG PO TABS: ORAL_TABLET | ORAL | Status: DC

## 2013-07-26 ENCOUNTER — Telehealth: Payer: Self-pay | Admitting: Internal Medicine

## 2013-07-26 NOTE — Telephone Encounter (Signed)
New message     Pt is having minor surgery on shoulder on 5-21.  He is on plavix and aspirin.  Can he stop rx 1wk prior to surgery

## 2013-07-26 NOTE — Telephone Encounter (Signed)
Spoke with patient and let him know I would speak with Dr Rayann Heman on Mon if he does not respond to me before

## 2013-07-31 NOTE — Telephone Encounter (Signed)
Dr Rayann Heman says ok to stop to remove spot if medically necessary.  Left patient a message

## 2013-08-15 ENCOUNTER — Telehealth: Payer: Self-pay | Admitting: Internal Medicine

## 2013-08-15 NOTE — Telephone Encounter (Signed)
New message     Pt is in the hosp----[facility].  Wife request to talk to Jerry Gilbert to tell her all about him.

## 2013-08-15 NOTE — Telephone Encounter (Signed)
His dog knocked him down and was having a lot of rib pain, was put on antiinflammatory and had a bleed.  Had 4 ulcers in the small intestines HBG was 7.0, they clamped those off.  He has gotten 12 units of blood since being flown to [facility] on Sun and 3 units of platelets.  Intubated

## 2013-08-21 NOTE — Telephone Encounter (Signed)
New message     Patient been discharge from the hospital for GI bleed on tomorrow .   Wants to give the nurse an update on patient .

## 2013-08-22 NOTE — Telephone Encounter (Signed)
Follow up     Talk to [facility] today please regarding pt.  He is still in the hospital at [facility].  Wife want him sent to [facility] and want to talk it over with Dr Rayann Heman.

## 2013-08-22 NOTE — Telephone Encounter (Signed)
Spoke with wife and was suppose to go home yesterday if hemoglobin was stable despite the fact that he had black stools  His hemoglobin is now 7.5.   Tried to explain that he needs to stay where he is.

## 2013-08-29 DIAGNOSIS — D500 Iron deficiency anemia secondary to blood loss (chronic): Secondary | ICD-10-CM | POA: Insufficient documentation

## 2013-08-29 DIAGNOSIS — I714 Abdominal aortic aneurysm, without rupture, unspecified: Secondary | ICD-10-CM | POA: Insufficient documentation

## 2013-08-29 DIAGNOSIS — K279 Peptic ulcer, site unspecified, unspecified as acute or chronic, without hemorrhage or perforation: Secondary | ICD-10-CM | POA: Insufficient documentation

## 2013-09-23 ENCOUNTER — Telehealth: Payer: Self-pay | Admitting: Internal Medicine

## 2013-09-23 NOTE — Telephone Encounter (Signed)
Wife calling stating he was d/c from [facility] on 6/14.  They stopped all of his BP meds because his Hgb was low as well as BP.  Is off Plavix, ASA, Cardizem, HCTZ, and Lisinopril.  States BP has been gradually going up.  Three days ago BP was 160/90.  She restarted Lopressor 25 mg and gave it to him TID x 2 days. Last night BP 190/110.  She restarted Clonidine 0.2 mg. Took again 1 hr later and was 120/80.  This AM BP 160/94.  She gave him Clonidine and Lopressor. While talking to her she took BP and was 130/80 HR 80.  When he was d/c they told her that if BP went up to restart Lopressor then Clonidine but not the Cardizem or Lisinopril.  Had Hbg checked by clinic in [place] on 6/18 and was 9.5 and again on 6/25 was 10. Updated his medication list.  She wants to know what BP meds he should stay on and if should he have any blood work done.  She also is asking for an appointment.  Advised would forward to Dr. Rayann Heman and Leonia Reader for advise.

## 2013-09-23 NOTE — Telephone Encounter (Signed)
Reroute message

## 2013-09-23 NOTE — Telephone Encounter (Signed)
New message   Pt wife request a call back to discuss the pt's BP medications. She believes that his meds will need to be altered. Please call

## 2013-09-25 NOTE — Telephone Encounter (Signed)
Discussed with Dr Rayann Heman and he agrees to start one medication back at a time.  He is now back on the Lopressor 25mg  tid, Clonidine 0.2mg  bid.  His BP is 130/90.  He is going to stay with these two medications as long as they are holding his BP and i will get him an office visit

## 2013-09-25 NOTE — Telephone Encounter (Signed)
Follow up:    Pt's wife calling to follow up on his previous message regarding pt's medication please.

## 2013-10-02 ENCOUNTER — Encounter: Payer: Self-pay | Admitting: Internal Medicine

## 2013-10-02 ENCOUNTER — Ambulatory Visit (INDEPENDENT_AMBULATORY_CARE_PROVIDER_SITE_OTHER): Payer: Medicare Other | Admitting: Internal Medicine

## 2013-10-02 VITALS — BP 158/87 | HR 64 | Ht 71.0 in | Wt 167.8 lb

## 2013-10-02 DIAGNOSIS — I719 Aortic aneurysm of unspecified site, without rupture: Secondary | ICD-10-CM

## 2013-10-02 DIAGNOSIS — I714 Abdominal aortic aneurysm, without rupture, unspecified: Secondary | ICD-10-CM

## 2013-10-02 DIAGNOSIS — R0602 Shortness of breath: Secondary | ICD-10-CM

## 2013-10-02 DIAGNOSIS — F17200 Nicotine dependence, unspecified, uncomplicated: Secondary | ICD-10-CM

## 2013-10-02 DIAGNOSIS — I471 Supraventricular tachycardia: Secondary | ICD-10-CM

## 2013-10-02 DIAGNOSIS — I2510 Atherosclerotic heart disease of native coronary artery without angina pectoris: Secondary | ICD-10-CM

## 2013-10-02 DIAGNOSIS — I498 Other specified cardiac arrhythmias: Secondary | ICD-10-CM

## 2013-10-02 MED ORDER — ASPIRIN EC 81 MG PO TBEC: 81.0000 mg | DELAYED_RELEASE_TABLET | Freq: Every day | ORAL | Status: AC

## 2013-10-02 MED ORDER — LISINOPRIL 10 MG PO TABS: 10.0000 mg | ORAL_TABLET | Freq: Every day | ORAL | Status: DC

## 2013-10-02 NOTE — Patient Instructions (Signed)
Your physician recommends that you schedule a follow-up appointment in: 3 months with Dr Rayann Heman  Your physician has recommended you make the following change in your medication:  1) Restart Lisinopril 10mg  daily 2) Start Aspirin 81mg  daily   You have been referred to [facility] Dr Oneida Alar

## 2013-10-06 ENCOUNTER — Encounter: Payer: Self-pay | Admitting: Internal Medicine

## 2013-10-06 NOTE — Progress Notes (Signed)
PCP: none currently  The patient presents today for cardiology followup.  He was recently hospitalized at [facility] for GI bleed which occurred in the setting of new NSAID use.  He has made slow recovery.  He has returned to fishing.  Unfortunately, he continues to drink and smoke. He has no intention of quitting.  He has rare angina and stable SOB. Today, he denies symptoms of palpitations, orthopnea, PND, lower extremity edema,  syncope, or neurologic sequela.  The patient feels that he is tolerating medications without difficulties and is otherwise without complaint today.   Past Medical History  Diagnosis Date  . Atrial tachycardia   . CAD (coronary artery disease)     S/P PCI LAD  . Hypertension   . Personal history of unspecified circulatory disease     Transient Ischemic attacks  . Prostate cancer   . Tobacco abuse   . AAA (abdominal aortic aneurysm) 11/04/09    3.6 cmx 3.4 cm   Past Surgical History  Procedure Laterality Date  . Bare-metal stent      Left circumflex about 10 years ago  . Pci of the lad    . Prostate surgery      Current Outpatient Prescriptions  Medication Sig Dispense Refill  . cloNIDine (CATAPRES) 0.2 MG tablet TAKE ONE TABLET BY MOUTH TWICE DAILY  180 tablet  2  . dronedarone (MULTAQ) 400 MG tablet Take 1 tablet (400 mg total) by mouth 2 (two) times daily with a meal.  30 tablet  3  . ferrous gluconate (FERGON) 240 (27 FE) MG tablet Take 1 tablet by mouth 3 (three) times daily. Pt is currently only taking this one time a day (10/02/13)      . metoprolol tartrate (LOPRESSOR) 25 MG tablet TAKE ONE TABLET BY MOUTH THREE TIMES A DAY (GENERIC  FOR  LOPRESSOR)      . nitroGLYCERIN (NITROSTAT) 0.4 MG SL tablet Place 1 tablet (0.4 mg total) under the tongue every 5 (five) minutes as needed for chest pain.  25 tablet  12  . pantoprazole (PROTONIX) 40 MG tablet Take 1 tablet by mouth 2 (two) times daily.      [name] aspirin EC 81 MG tablet Take 1 tablet (81 mg total) by  mouth daily.  90 tablet  3  . lisinopril (PRINIVIL,ZESTRIL) 10 MG tablet Take 1 tablet (10 mg total) by mouth daily.  90 tablet  3   No current facility-administered medications for this visit.    Allergies  Allergen Reactions  . Celecoxib     History   Social History  . Marital Status: Married    Spouse Name: N/A    Number of Children: N/A  . Years of Education: N/A   Occupational History  . Retired    Social History Main Topics  . Smoking status: Current Every Day Smoker -- 1.00 packs/day for 80 years  . Smokeless tobacco: Not on file     Comment: he states that he is trying to quit  . Alcohol Use: Yes     Comment: Drinks several shots most nights  . Drug Use: No     Comment: No illicit drug use and only takes multivitamin as far as herbal medication goes.  . Sexual Activity: Not on file   Other Topics Concern  . Not on file   Social History Narrative   Lives in [place], [place] with his wife   Remains active, working around his house   Diet is regular  Family History  Problem Relation Age of Onset  . Heart attack Mother 48  . Coronary artery disease Brother   . Arrhythmia Brother     S/P cardioversion   Physical Exam: Filed Vitals:   10/02/13 1612  BP: 158/87  Pulse: 64  Height: 5\' 11"  (1.803 m)  Weight: 167 lb 12.8 oz (76.114 kg)    GEN- The patient is well appearing, alert and oriented x 3 today.   Head- normocephalic, atraumatic Eyes-  Sclera clear, conjunctiva pink Ears- hearing intact Oropharynx- clear Lungs- Clear to ausculation bilaterally, normal work of breathing Heart- Regular rate and rhythm, no murmurs, rubs or gallops, PMI not laterally displaced GI- soft, NT, ND, + BS, aneurysm is not palpable today Extremities- no clubbing, cyanosis, or edema  ekg today reveals sinus rhythm 64 bpm, LAHB   Assessment and Plan:  Atrial tachycardia   Well controlled No changes  CAD  myoview 2013 is reviwed and normal Restart  ASA  TOBACCO ABUSE  Cessation advised  He is not ready to quit  HYPERTENSION  Above goal Restart lisinopril (antihypertensives were discontinued when he had his recent GI bleed)  Aortic aneurysm  Doing well without symptoms  I think that it is probably time for him to establish with vascular surgery.  I will refer to Dr Oneida Alar.  I doubt that surgery is required at this point, but given his ongoing smoking and potential for diffuse vascular disease, I think that it would be beneficial for him to establish with Dr Oneida Alar.   Return in 1 year

## 2013-10-16 ENCOUNTER — Telehealth: Payer: Self-pay | Admitting: Internal Medicine

## 2013-10-16 NOTE — Telephone Encounter (Signed)
New message     Patients takes metoprolol.  His presc has changed to tid.  He needs his presc changed so that he can have enough medication.  He has 5 days of rx left.  Call presc in to [facility] in [place].   Pt also want samples of multaq.

## 2013-10-18 ENCOUNTER — Other Ambulatory Visit: Payer: Self-pay

## 2013-10-18 MED ORDER — METOPROLOL TARTRATE 25 MG PO TABS: ORAL_TABLET | ORAL | Status: DC

## 2013-10-22 ENCOUNTER — Other Ambulatory Visit: Payer: Self-pay | Admitting: *Deleted

## 2013-10-22 DIAGNOSIS — I714 Abdominal aortic aneurysm, without rupture, unspecified: Secondary | ICD-10-CM

## 2013-10-31 ENCOUNTER — Other Ambulatory Visit (HOSPITAL_COMMUNITY): Payer: Medicare Other

## 2013-10-31 ENCOUNTER — Encounter: Payer: Medicare Other | Admitting: Vascular Surgery

## 2013-11-05 ENCOUNTER — Encounter: Payer: Self-pay | Admitting: Vascular Surgery

## 2013-11-06 ENCOUNTER — Ambulatory Visit (HOSPITAL_COMMUNITY)
Admission: RE | Admit: 2013-11-06 | Discharge: 2013-11-06 | Disposition: A | Discharge status: Home / Self Care | Payer: Medicare Other | Source: Ambulatory Visit | Attending: Vascular Surgery | Admitting: Vascular Surgery

## 2013-11-06 ENCOUNTER — Telehealth: Payer: Self-pay | Admitting: Internal Medicine

## 2013-11-06 ENCOUNTER — Ambulatory Visit (INDEPENDENT_AMBULATORY_CARE_PROVIDER_SITE_OTHER): Payer: Medicare Other | Admitting: Vascular Surgery

## 2013-11-06 VITALS — BP 160/98 | HR 55 | Resp 16 | Ht 69.0 in | Wt 165.0 lb

## 2013-11-06 DIAGNOSIS — I714 Abdominal aortic aneurysm, without rupture, unspecified: Secondary | ICD-10-CM

## 2013-11-06 DIAGNOSIS — I2510 Atherosclerotic heart disease of native coronary artery without angina pectoris: Secondary | ICD-10-CM

## 2013-11-06 NOTE — Telephone Encounter (Signed)
New message      Patient ask if Jerry Gilbert can call the drug rep and get some multaq.  Refill dept said we did not have samples.

## 2013-11-07 ENCOUNTER — Encounter: Payer: Self-pay | Admitting: Vascular Surgery

## 2013-11-07 NOTE — Progress Notes (Signed)
[facility] [place] HISTORY AND PHYSICAL   History of Present Illness:  Patient is a 73 y.o. year old male who presents for evaluation of abdominal aortic. The patient has a known aneurysm that has been followed for several years. It was last measured in September 2014 at 3.9 cm. The patient denies any back or abdominal pain. He did have a severe GI bleed earlier this year which required 16 units of blood. He has not had any bleeding since then. He is currently on aspirin 81 mg once daily. He is tolerating this. He does smoke and currently does not have any intentions to quit.  Other medical problems include atrial tachycardia, coronary artery disease, hypertension, history of prostate cancer all of which are currently stable.  Past Medical History  Diagnosis Date  . Atrial tachycardia   . CAD (coronary artery disease)     S/P PCI LAD  . Hypertension   . Personal history of unspecified circulatory disease     Transient Ischemic attacks  . Prostate cancer   . Tobacco abuse   . AAA (abdominal aortic aneurysm) 11/04/09    3.6 cmx 3.4 cm    Past Surgical History  Procedure Laterality Date  . Bare-metal stent      Left circumflex about 10 years ago  . Pci of the lad    . Prostate surgery      Social History History  Substance Use Topics  . Smoking status: Current Every Day Smoker -- 1.00 packs/day for 80 years  . Smokeless tobacco: Not on file  . Alcohol Use: Yes     Comment: Drinks several shots most nights    Family History Family History  Problem Relation Age of Onset  . Heart attack Mother 69  . Coronary artery disease Brother   . Arrhythmia Brother     S/P cardioversion    Allergies  Allergies  Allergen Reactions  . Celecoxib      Current Outpatient Prescriptions  Medication Sig Dispense Refill  . aspirin EC 81 MG tablet Take 1 tablet (81 mg total) by mouth daily.  90 tablet  3  . cloNIDine (CATAPRES) 0.2 MG tablet TAKE ONE TABLET BY MOUTH  TWICE DAILY  180 tablet  2  . dronedarone (MULTAQ) 400 MG tablet Take 1 tablet (400 mg total) by mouth 2 (two) times daily with a meal.  30 tablet  3  . lisinopril (PRINIVIL,ZESTRIL) 10 MG tablet Take 1 tablet (10 mg total) by mouth daily.  90 tablet  3  . metoprolol tartrate (LOPRESSOR) 25 MG tablet TAKE ONE TABLET BY MOUTH THREE TIMES A DAY (GENERIC  FOR  LOPRESSOR)  90 tablet  6  . nitroGLYCERIN (NITROSTAT) 0.4 MG SL tablet Place 1 tablet (0.4 mg total) under the tongue every 5 (five) minutes as needed for chest pain.  25 tablet  12  . pantoprazole (PROTONIX) 40 MG tablet Take 1 tablet by mouth 2 (two) times daily.      . ferrous gluconate (FERGON) 240 (27 FE) MG tablet Take 1 tablet by mouth 3 (three) times daily. Pt is currently only taking this one time a day (10/02/13)       No current facility-administered medications for this visit.    ROS:   General:  No weight loss, Fever, chills  HEENT: No recent headaches, no nasal bleeding, no visual changes, no sore throat  Neurologic: No dizziness, blackouts, seizures. No recent symptoms of stroke or mini- stroke. No recent episodes  of slurred speech, or temporary blindness.  Cardiac: No recent episodes of chest pain/pressure, no shortness of breath at rest.  +shortness of breath with exertion.    Vascular: No history of rest pain in feet.  No history of claudication.  No history of non-healing ulcer, No history of DVT   Pulmonary: No home oxygen, no productive cough, no hemoptysis,  No asthma or wheezing  Musculoskeletal:  [ ]  Arthritis, [ ]  Low back pain,  [ ]  Joint pain  Hematologic:No history of hypercoagulable state.  No history of easy bleeding.  No history of anemia  Gastrointestinal: No recent hematochezia or melena,  No gastroesophageal reflux, no trouble swallowing  Urinary: [ ]  chronic Kidney disease, [ ]  on HD - [ ]  MWF or [ ]  TTHS, [ ]  Burning with urination, [ ]  Frequent urination, [ ]  Difficulty urinating;   Skin: No  rashes  Psychological: No history of anxiety,  No history of depression   Physical Examination  Filed Vitals:   11/06/13 0922  BP: 160/98  Pulse: 55  Resp: 16  Height: 5\' 9"  (1.753 m)  Weight: 165 lb (74.844 kg)    Body mass index is 24.36 kg/(m^2).  General:  Alert and oriented, no acute distress HEENT: Normal Neck: No bruit or JVD Pulmonary: Clear to auscultation bilaterally Cardiac: Regular Rate and Rhythm without murmur Abdomen: Soft, non-tender, non-distended, no mass Skin: No rash Extremity Pulses:  2+ radial, brachial, femoral, 2+ right dorsalis pedis, 2+ left posterior tibial pulse Musculoskeletal: No deformity or edema  Neurologic: Upper and lower extremity motor 5/5 and symmetric  DATA:  Patient had a repeat aortic duplex scan today. I reviewed and interpreted the skin. Aneurysm diameter today was 4.2 x 4.1 cm   ASSESSMENT: 4 cm abdominal aortic aneurysm asymptomatic   PLAN:  Followup 6 months repeat ultrasound. Patient was advised of signs and symptoms of ruptured aneurysm and will return to the emergency room if he exhibits any of these.  Ruta Hinds, MD [facility] [place] Office: [phone] Pager: [phone]

## 2013-11-07 NOTE — Telephone Encounter (Signed)
Lm no samples

## 2013-11-11 NOTE — Telephone Encounter (Signed)
Sample left out front

## 2013-12-09 ENCOUNTER — Telehealth: Payer: Self-pay

## 2013-12-09 NOTE — Telephone Encounter (Signed)
Called patient to let him know that I placed him some Multaq up front for him to pick up. A 30 day supply was placed

## 2013-12-30 ENCOUNTER — Encounter: Payer: Self-pay | Admitting: Internal Medicine

## 2013-12-30 ENCOUNTER — Ambulatory Visit (INDEPENDENT_AMBULATORY_CARE_PROVIDER_SITE_OTHER): Payer: Medicare Other | Admitting: Internal Medicine

## 2013-12-30 VITALS — BP 138/82 | HR 56 | Ht 69.0 in | Wt 173.0 lb

## 2013-12-30 DIAGNOSIS — I2510 Atherosclerotic heart disease of native coronary artery without angina pectoris: Secondary | ICD-10-CM

## 2013-12-30 DIAGNOSIS — Z720 Tobacco use: Secondary | ICD-10-CM

## 2013-12-30 DIAGNOSIS — I471 Supraventricular tachycardia: Secondary | ICD-10-CM

## 2013-12-30 DIAGNOSIS — I719 Aortic aneurysm of unspecified site, without rupture: Secondary | ICD-10-CM

## 2013-12-30 DIAGNOSIS — I119 Hypertensive heart disease without heart failure: Secondary | ICD-10-CM

## 2013-12-30 DIAGNOSIS — F17200 Nicotine dependence, unspecified, uncomplicated: Secondary | ICD-10-CM

## 2013-12-30 MED ORDER — NITROGLYCERIN 0.4 MG SL SUBL: 0.4000 mg | SUBLINGUAL_TABLET | SUBLINGUAL | Status: DC | PRN

## 2013-12-30 NOTE — Patient Instructions (Addendum)
Your physician wants you to follow-up in: 6 months with Dr Vallery Ridge will receive a reminder letter in the mail two months in advance. If you don't receive a letter, please call our office to schedule the follow-up appointment.     Smoking Cessation Quitting smoking is important to your health and has many advantages. However, it is not always easy to quit since nicotine is a very addictive drug. Oftentimes, people try 3 times or more before being able to quit. This document explains the best ways for you to prepare to quit smoking. Quitting takes hard work and a lot of effort, but you can do it. ADVANTAGES OF QUITTING SMOKING  You will live longer, feel better, and live better.  Your body will feel the impact of quitting smoking almost immediately.  Within 20 minutes, blood pressure decreases. Your pulse returns to its normal level.  After 8 hours, carbon monoxide levels in the blood return to normal. Your oxygen level increases.  After 24 hours, the chance of having a heart attack starts to decrease. Your breath, hair, and body stop smelling like smoke.  After 48 hours, damaged nerve endings begin to recover. Your sense of taste and smell improve.  After 72 hours, the body is virtually free of nicotine. Your bronchial tubes relax and breathing becomes easier.  After 2 to 12 weeks, lungs can hold more air. Exercise becomes easier and circulation improves.  The risk of having a heart attack, stroke, cancer, or lung disease is greatly reduced.  After 1 year, the risk of coronary heart disease is cut in half.  After 5 years, the risk of stroke falls to the same as a nonsmoker.  After 10 years, the risk of lung cancer is cut in half and the risk of other cancers decreases significantly.  After 15 years, the risk of coronary heart disease drops, usually to the level of a nonsmoker.  If you are pregnant, quitting smoking will improve your chances of having a healthy baby.  The  people you live with, especially any children, will be healthier.  You will have extra money to spend on things other than cigarettes. QUESTIONS TO THINK ABOUT BEFORE ATTEMPTING TO QUIT You may want to talk about your answers with your health care provider.  Why do you want to quit?  If you tried to quit in the past, what helped and what did not?  What will be the most difficult situations for you after you quit? How will you plan to handle them?  Who can help you through the tough times? Your family? Friends? A health care provider?  What pleasures do you get from smoking? What ways can you still get pleasure if you quit? Here are some questions to ask your health care provider:  How can you help me to be successful at quitting?  What medicine do you think would be best for me and how should I take it?  What should I do if I need more help?  What is smoking withdrawal like? How can I get information on withdrawal? GET READY  Set a quit date.  Change your environment by getting rid of all cigarettes, ashtrays, matches, and lighters in your home, car, or work. Do not let people smoke in your home.  Review your past attempts to quit. Think about what worked and what did not. GET SUPPORT AND ENCOURAGEMENT You have a better chance of being successful if you have help. You can get support in  many ways.  Tell your family, friends, and coworkers that you are going to quit and need their support. Ask them not to smoke around you.  Get individual, group, or telephone counseling and support. Programs are available at [facility] and health centers. Call your local health department for information about programs in your area.  Spiritual beliefs and practices may help some smokers quit.  Download a "quit meter" on your computer to keep track of quit statistics, such as how long you have gone without smoking, cigarettes not smoked, and money saved.  Get a self-help book about  quitting smoking and staying off tobacco. Logan yourself from urges to smoke. Talk to someone, go for a walk, or occupy your time with a task.  Change your normal routine. Take a different route to work. Drink tea instead of coffee. Eat breakfast in a different place.  Reduce your stress. Take a hot bath, exercise, or read a book.  Plan something enjoyable to do every day. Reward yourself for not smoking.  Explore interactive web-based programs that specialize in helping you quit. GET MEDICINE AND USE IT CORRECTLY Medicines can help you stop smoking and decrease the urge to smoke. Combining medicine with the above behavioral methods and support can greatly increase your chances of successfully quitting smoking.  Nicotine replacement therapy helps deliver nicotine to your body without the negative effects and risks of smoking. Nicotine replacement therapy includes nicotine gum, lozenges, inhalers, nasal sprays, and skin patches. Some may be available over-the-counter and others require a prescription.  Antidepressant medicine helps people abstain from smoking, but how this works is unknown. This medicine is available by prescription.  Nicotinic receptor partial agonist medicine simulates the effect of nicotine in your brain. This medicine is available by prescription. Ask your health care provider for advice about which medicines to use and how to use them based on your health history. Your health care provider will tell you what side effects to look out for if you choose to be on a medicine or therapy. Carefully read the information on the package. Do not use any other product containing nicotine while using a nicotine replacement product.  RELAPSE OR DIFFICULT SITUATIONS Most relapses occur within the first 3 months after quitting. Do not be discouraged if you start smoking again. Remember, most people try several times before finally quitting. You may have  symptoms of withdrawal because your body is used to nicotine. You may crave cigarettes, be irritable, feel very hungry, cough often, get headaches, or have difficulty concentrating. The withdrawal symptoms are only temporary. They are strongest when you first quit, but they will go away within 10-14 days. To reduce the chances of relapse, try to:  Avoid drinking alcohol. Drinking lowers your chances of successfully quitting.  Reduce the amount of caffeine you consume. Once you quit smoking, the amount of caffeine in your body increases and can give you symptoms, such as a rapid heartbeat, sweating, and anxiety.  Avoid smokers because they can make you want to smoke.  Do not let weight gain distract you. Many smokers will gain weight when they quit, usually less than 10 pounds. Eat a healthy diet and stay active. You can always lose the weight gained after you quit.  Find ways to improve your mood other than smoking. FOR MORE INFORMATION  www.smokefree.gov  Document Released: 02/22/2001 Document Revised: 07/15/2013 Document Reviewed: 06/09/2011 [facility] Patient Information 2015 [place], [place]. This information is not intended to  replace advice given to you by your health care provider. Make sure you discuss any questions you have with your health care provider.

## 2013-12-30 NOTE — Progress Notes (Signed)
PCP: Dr Lanny Cramp in [facility])  The patient presents today for cardiology followup.  He has returned to his baseline health state.  He has returned to fishing.  Unfortunately, he continues to drink and smoke. He has no intention of quitting.  He has rare angina and stable SOB. Today, he denies symptoms of palpitations, orthopnea, PND, lower extremity edema,  syncope, or neurologic sequela.  The patient feels that he is tolerating medications without difficulties and is otherwise without complaint today.   Past Medical History  Diagnosis Date  . Atrial tachycardia   . CAD (coronary artery disease)     S/P PCI LAD  . Hypertension   . Personal history of unspecified circulatory disease     Transient Ischemic attacks  . Prostate cancer   . Tobacco abuse   . AAA (abdominal aortic aneurysm) 11/04/09    3.6 cmx 3.4 cm   Past Surgical History  Procedure Laterality Date  . Bare-metal stent      Left circumflex about 10 years ago  . Pci of the lad    . Prostate surgery      Current Outpatient Prescriptions  Medication Sig Dispense Refill  . aspirin EC 81 MG tablet Take 1 tablet (81 mg total) by mouth daily.  90 tablet  3  . cloNIDine (CATAPRES) 0.2 MG tablet TAKE ONE TABLET BY MOUTH TWICE DAILY  180 tablet  2  . dronedarone (MULTAQ) 400 MG tablet Take 1 tablet (400 mg total) by mouth 2 (two) times daily with a meal.  30 tablet  3  . lisinopril (PRINIVIL,ZESTRIL) 10 MG tablet Take 1 tablet (10 mg total) by mouth daily.  90 tablet  3  . metoprolol tartrate (LOPRESSOR) 25 MG tablet TAKE ONE TABLET BY MOUTH THREE TIMES A DAY (GENERIC  FOR  LOPRESSOR)  90 tablet  6  . nitroGLYCERIN (NITROSTAT) 0.4 MG SL tablet Place 1 tablet (0.4 mg total) under the tongue every 5 (five) minutes as needed for chest pain.  25 tablet  12  . pantoprazole (PROTONIX) 40 MG tablet Take 1 tablet by mouth 2 (two) times daily.       No current facility-administered medications for this visit.    Allergies  Allergen  Reactions  . Celecoxib     History   Social History  . Marital Status: Married    Spouse Name: N/A    Number of Children: N/A  . Years of Education: N/A   Occupational History  . Retired    Social History Main Topics  . Smoking status: Current Every Day Smoker -- 1.00 packs/day for 80 years  . Smokeless tobacco: Not on file  . Alcohol Use: Yes     Comment: Drinks several shots most nights  . Drug Use: No     Comment: No illicit drug use and only takes multivitamin as far as herbal medication goes.  . Sexual Activity: Not on file   Other Topics Concern  . Not on file   Social History Narrative   Lives in [place], [place] with his wife   Remains active, working around his house   Diet is regular     Family History  Problem Relation Age of Onset  . Heart attack Mother 33  . Coronary artery disease Brother   . Arrhythmia Brother     S/P cardioversion   Physical Exam: Filed Vitals:   12/30/13 1223  BP: 138/82  Pulse: 56  Height: 5\' 9"  (1.753 m)  Weight: 173  lb (78.472 kg)    GEN- The patient is well appearing, alert and oriented x 3 today.   Head- normocephalic, atraumatic Eyes-  Sclera clear, conjunctiva pink Ears- hearing intact Oropharynx- clear Lungs- Clear to ausculation bilaterally, normal work of breathing Heart- Regular rate and rhythm, no murmurs, rubs or gallops, PMI not laterally displaced GI- soft, NT, ND, + BS, aneurysm is not palpable today Extremities- no clubbing, cyanosis, or edema  ekg today reveals sinus rhythm 56 bpm, LAHB, nonspecific St/T changes, Qtc 482  Assessment and Plan:  Atrial tachycardia   Well controlled on multaq No changes  CAD  Stable No change required today  TOBACCO ABUSE  Cessation advised  He is not ready to quit  HYPERTENSIVE Cardiovascular disease  Stable No change required today Consider increasing lisinopril if remains elevated  Aortic aneurysm  Doing well without symptoms  He is now followed  by Dr Oneida Alar   Return in 6 months

## 2014-01-26 ENCOUNTER — Other Ambulatory Visit: Payer: Self-pay | Admitting: Internal Medicine

## 2014-01-27 ENCOUNTER — Telehealth: Payer: Self-pay | Admitting: *Deleted

## 2014-01-27 NOTE — Telephone Encounter (Signed)
Multaq samples placed at the front desk for patient. 

## 2014-02-12 ENCOUNTER — Telehealth: Payer: Self-pay | Admitting: Internal Medicine

## 2014-02-12 NOTE — Telephone Encounter (Signed)
New Msg   Patient had flu shot 3 weeks ago in his home town at [facility]. Contacted pt at [phone].

## 2014-02-28 ENCOUNTER — Other Ambulatory Visit: Payer: Self-pay | Admitting: *Deleted

## 2014-02-28 MED ORDER — PANTOPRAZOLE SODIUM 40 MG PO TBEC: 40.0000 mg | DELAYED_RELEASE_TABLET | Freq: Two times a day (BID) | ORAL | Status: DC

## 2014-03-27 ENCOUNTER — Other Ambulatory Visit: Payer: Self-pay | Admitting: Internal Medicine

## 2014-03-28 ENCOUNTER — Other Ambulatory Visit: Payer: Self-pay

## 2014-03-28 MED ORDER — DRONEDARONE HCL 400 MG PO TABS: 400.0000 mg | ORAL_TABLET | Freq: Two times a day (BID) | ORAL | Status: DC

## 2014-04-01 ENCOUNTER — Other Ambulatory Visit: Payer: Self-pay

## 2014-04-01 ENCOUNTER — Telehealth: Payer: Self-pay

## 2014-04-01 MED ORDER — DRONEDARONE HCL 400 MG PO TABS: 400.0000 mg | ORAL_TABLET | Freq: Two times a day (BID) | ORAL | Status: DC

## 2014-04-01 NOTE — Telephone Encounter (Signed)
Patient called for samples of multaq placed samples up front

## 2014-05-07 ENCOUNTER — Encounter: Payer: Self-pay | Admitting: Vascular Surgery

## 2014-05-08 ENCOUNTER — Ambulatory Visit (HOSPITAL_COMMUNITY)
Admission: RE | Admit: 2014-05-08 | Discharge: 2014-05-08 | Disposition: A | Discharge status: Home / Self Care | Payer: Medicare Other | Source: Ambulatory Visit | Attending: Vascular Surgery | Admitting: Vascular Surgery

## 2014-05-08 ENCOUNTER — Encounter: Payer: Self-pay | Admitting: Vascular Surgery

## 2014-05-08 ENCOUNTER — Ambulatory Visit (INDEPENDENT_AMBULATORY_CARE_PROVIDER_SITE_OTHER): Payer: Medicare Other | Admitting: Vascular Surgery

## 2014-05-08 VITALS — BP 144/77 | HR 53 | Ht 69.0 in | Wt 181.4 lb

## 2014-05-08 DIAGNOSIS — I714 Abdominal aortic aneurysm, without rupture, unspecified: Secondary | ICD-10-CM

## 2014-05-08 NOTE — Progress Notes (Signed)
[facility] [place] HISTORY AND PHYSICAL    History of Present Illness:  Patient is a 74 y.o. year old male who presents for evaluation of abdominal aortic. The patient has a known aneurysm that has been followed for several years. It was last measured in September 2014 at 3.9 cm. The patient denies any back or abdominal pain. He did have a severe GI bleed earlier this year which required 16 units of blood. He has not had any bleeding since then. He is currently on aspirin 81 mg once daily. He is tolerating this. He does smoke and currently does not have any intentions to quit.  Other medical problems include atrial tachycardia, coronary artery disease, hypertension, history of prostate cancer all of which are currently stable.    Past Medical History   Diagnosis  Date   .  Atrial tachycardia     .  CAD (coronary artery disease)         S/P PCI LAD   .  Hypertension     .  Personal history of unspecified circulatory disease         Transient Ischemic attacks   .  Prostate cancer     .  Tobacco abuse     .  AAA (abdominal aortic aneurysm)  11/04/09       3.6 cmx 3.4 cm       Past Surgical History   Procedure  Laterality  Date   .  Bare-metal stent           Left circumflex about 10 years ago   .  Pci of the lad       .  Prostate surgery         Social History History   Substance Use Topics   .  Smoking status:  Current Every Day Smoker -- 1.00 packs/day for 80 years   .  Smokeless tobacco:  Not on file   .  Alcohol Use:  Yes         Comment: Drinks several shots most nights     Family History Family History   Problem  Relation  Age of Onset   .  Heart attack  Mother  28   .  Coronary artery disease  Brother     .  Arrhythmia  Brother         S/P cardioversion     Allergies    Allergies   Allergen  Reactions   .  Celecoxib          Current Outpatient Prescriptions   Medication  Sig  Dispense  Refill   .  aspirin EC 81 MG tablet  Take 1 tablet  (81 mg total) by mouth daily.   90 tablet   3   .  cloNIDine (CATAPRES) 0.2 MG tablet  TAKE ONE TABLET BY MOUTH TWICE DAILY   180 tablet   2   .  dronedarone (MULTAQ) 400 MG tablet  Take 1 tablet (400 mg total) by mouth 2 (two) times daily with a meal.   30 tablet   3   .  lisinopril (PRINIVIL,ZESTRIL) 10 MG tablet  Take 1 tablet (10 mg total) by mouth daily.   90 tablet   3   .  metoprolol tartrate (LOPRESSOR) 25 MG tablet  TAKE ONE TABLET BY MOUTH THREE TIMES A DAY (GENERIC  FOR  LOPRESSOR)   90 tablet   6   .  nitroGLYCERIN (NITROSTAT) 0.4 MG  SL tablet  Place 1 tablet (0.4 mg total) under the tongue every 5 (five) minutes as needed for chest pain.   25 tablet   12   .  pantoprazole (PROTONIX) 40 MG tablet  Take 1 tablet by mouth 2 (two) times daily.         .  ferrous gluconate (FERGON) 240 (27 FE) MG tablet  Take 1 tablet by mouth 3 (three) times daily. Pt is currently only taking this one time a day (10/02/13)            No current facility-administered medications for this visit.     ROS:    General:  No weight loss, Fever, chills  Cardiac: No recent episodes of chest pain/pressure, no shortness of breath at rest.  +shortness of breath with exertion.     Vascular: No history of rest pain in feet.  No history of claudication.  No history of non-healing ulcer, No history of DVT    Pulmonary: No home oxygen, no productive cough, no hemoptysis,  No asthma or wheezing    Physical Examination     Filed Vitals:   05/08/14 0931  BP: 144/77  Pulse: 53  Height: 5\' 9"  (1.753 m)  Weight: 181 lb 6.4 oz (82.283 kg)  SpO2: 96%   General:  Alert and oriented, no acute distress HEENT: Normal Neck: No bruit or JVD Pulmonary: Clear to auscultation bilaterally Cardiac: Regular Rate and Rhythm without murmur Abdomen: Soft, non-tender, non-distended, no mass Skin: No rash Extremity Pulses:  2+ radial, brachial, femoral, 2+ right dorsalis pedis, 2+ left posterior tibial  pulse Musculoskeletal: No deformity or edema     Neurologic: Upper and lower extremity motor 5/5 and symmetric  DATA:  Patient had a repeat aortic duplex scan today. I reviewed and interpreted the scan. Aneurysm diameter today was 4.7 cm today compared to 4.2 cm 6 months ago.   ASSESSMENT: 4.7 cm abdominal aortic aneurysm asymptomatic.   PLAN: '[place] the patient today that the aneurysm has grown a few millimeters since August 2015. If he continues to grow at this rate will need to consider repair. Risk of rupture for his aneurysm since it is less than 5 cm in diameter is less than 1% per year. This was discussed with the patient today as well. Followup 6 months repeat ultrasound. Patient was advised of signs and symptoms of ruptured aneurysm and will return to the emergency room if he exhibits any of these.  The patient also stated he has had some elevated blood pressure at home. This is followed by his primary care physician as well as Dr. Rayann Heman. I encouraged him to start keeping an notebook of his blood pressure once daily. He will have some data to show his primary care physician's or his cardiologist at his next evaluation.  Ruta Hinds, MD [facility] [place] Office: [phone] Pager: [phone]

## 2014-05-08 NOTE — Addendum Note (Signed)
Addended by: Mena Goes on: 05/08/2014 04:54 PM   Modules accepted: Orders

## 2014-05-27 ENCOUNTER — Other Ambulatory Visit: Payer: Self-pay | Admitting: Internal Medicine

## 2014-05-29 ENCOUNTER — Telehealth: Payer: Self-pay | Admitting: *Deleted

## 2014-05-29 NOTE — Telephone Encounter (Signed)
Multaq samples placed at the front desk for patient.

## 2014-07-01 ENCOUNTER — Other Ambulatory Visit: Payer: Self-pay | Admitting: Internal Medicine

## 2014-07-01 ENCOUNTER — Other Ambulatory Visit: Payer: Self-pay

## 2014-07-01 MED ORDER — METOPROLOL TARTRATE 25 MG PO TABS: ORAL_TABLET | ORAL | Status: DC

## 2014-07-23 ENCOUNTER — Ambulatory Visit: Payer: Medicare Other | Admitting: Internal Medicine

## 2014-08-23 ENCOUNTER — Other Ambulatory Visit: Payer: Self-pay | Admitting: Internal Medicine

## 2014-08-26 ENCOUNTER — Other Ambulatory Visit: Payer: Self-pay | Admitting: *Deleted

## 2014-08-26 ENCOUNTER — Telehealth: Payer: Self-pay | Admitting: Internal Medicine

## 2014-08-26 MED ORDER — LISINOPRIL 10 MG PO TABS: 10.0000 mg | ORAL_TABLET | Freq: Every day | ORAL | Status: DC

## 2014-08-26 NOTE — Telephone Encounter (Signed)
Pt needs refill of Lisinopril bid-pharmacy has it as 1qd so they will not refill his rx,  Only has 3 pills left, [facility] in [place], pt [phone]

## 2014-09-01 ENCOUNTER — Ambulatory Visit (INDEPENDENT_AMBULATORY_CARE_PROVIDER_SITE_OTHER): Payer: Medicare Other | Admitting: Internal Medicine

## 2014-09-01 ENCOUNTER — Encounter: Payer: Self-pay | Admitting: Internal Medicine

## 2014-09-01 VITALS — BP 160/88 | HR 56 | Ht 69.0 in | Wt 172.0 lb

## 2014-09-01 DIAGNOSIS — Z720 Tobacco use: Secondary | ICD-10-CM

## 2014-09-01 DIAGNOSIS — I471 Supraventricular tachycardia: Secondary | ICD-10-CM | POA: Diagnosis not present

## 2014-09-01 DIAGNOSIS — F17200 Nicotine dependence, unspecified, uncomplicated: Secondary | ICD-10-CM

## 2014-09-01 DIAGNOSIS — I119 Hypertensive heart disease without heart failure: Secondary | ICD-10-CM | POA: Diagnosis not present

## 2014-09-01 DIAGNOSIS — I2510 Atherosclerotic heart disease of native coronary artery without angina pectoris: Secondary | ICD-10-CM

## 2014-09-01 LAB — LIPID PANEL
CHOLESTEROL: 199 mg/dL (ref 0–200)
HDL: 32.6 mg/dL — AB (ref >39.00)
LDL Cholesterol: 151 mg/dL — ABNORMAL HIGH (ref 0–99)
NonHDL: 166.4
TRIGLYCERIDES: 79 mg/dL (ref 0.0–149.0)
Total CHOL/HDL Ratio: 6
VLDL: 15.8 mg/dL (ref 0.0–40.0)

## 2014-09-01 LAB — COMPREHENSIVE METABOLIC PANEL
ALBUMIN: 3.7 g/dL (ref 3.5–5.2)
ALT: 10 U/L (ref 0–53)
AST: 12 U/L (ref 0–37)
Alkaline Phosphatase: 101 U/L (ref 39–117)
BILIRUBIN TOTAL: 0.5 mg/dL (ref 0.2–1.2)
BUN: 16 mg/dL (ref 6–23)
CO2: 28 mEq/L (ref 19–32)
Calcium: 8.9 mg/dL (ref 8.4–10.5)
Chloride: 101 mEq/L (ref 96–112)
Creatinine, Ser: 1.03 mg/dL (ref 0.40–1.50)
GFR: 75.26 mL/min (ref >60.00)
Glucose, Bld: 108 mg/dL — ABNORMAL HIGH (ref 70–99)
POTASSIUM: 4 meq/L (ref 3.5–5.1)
Sodium: 133 mEq/L — ABNORMAL LOW (ref 135–145)
TOTAL PROTEIN: 6.4 g/dL (ref 6.0–8.3)

## 2014-09-01 MED ORDER — LISINOPRIL 20 MG PO TABS: 20.0000 mg | ORAL_TABLET | Freq: Two times a day (BID) | ORAL | Status: DC

## 2014-09-01 MED ORDER — METOPROLOL TARTRATE 25 MG PO TABS: ORAL_TABLET | ORAL | Status: DC

## 2014-09-01 NOTE — Patient Instructions (Signed)
Medication Instructions:  Your physician has recommended you make the following change in your medication:  1) Increase Lisinopril to 20 mg twice daily    Labwork: Your physician recommends that you return for lab work today: CMP/fasting lipids   Testing/Procedures: None ordered  Follow-Up: Your physician wants you to follow-up in: 12 months with Dr Rayann Heman Dennis Bast will receive a reminder letter in the mail two months in advance. If you don't receive a letter, please call our office to schedule the follow-up appointment.   Any Other Special Instructions Will Be Listed Below (If Applicable).

## 2014-09-02 ENCOUNTER — Encounter: Payer: Self-pay | Admitting: Internal Medicine

## 2014-09-02 NOTE — Progress Notes (Signed)
PCP: Dr Lanny Cramp in [facility])  The patient presents today for cardiology followup.  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  He seems quite pleased with this even though he has not had any time for fishing.  Unfortunately, he continues to drink and smoke. He has no intention of quitting.  He has rare angina and stable SOB.  BP has recently been more elevated. Today, he denies symptoms of palpitations, orthopnea, PND, lower extremity edema,  syncope, or neurologic sequela.  The patient feels that he is tolerating medications without difficulties and is otherwise without complaint today.   Past Medical History  Diagnosis Date  . Atrial tachycardia   . CAD (coronary artery disease)     S/P PCI LAD  . Hypertension   . Personal history of unspecified circulatory disease     Transient Ischemic attacks  . Prostate cancer   . Tobacco abuse   . AAA (abdominal aortic aneurysm) 11/04/09    3.6 cmx 3.4 cm  . Anemia   . Stroke   . Myocardial infarction    Past Surgical History  Procedure Laterality Date  . Bare-metal stent      Left circumflex about 10 years ago  . Pci of the lad    . Prostate surgery      Current Outpatient Prescriptions  Medication Sig Dispense Refill  . aspirin EC 81 MG tablet Take 1 tablet (81 mg total) by mouth daily. 90 tablet 3  . cloNIDine (CATAPRES) 0.2 MG tablet TAKE ONE TABLET BY MOUTH TWICE DAILY 180 tablet 0  . dronedarone (MULTAQ) 400 MG tablet Take 1 tablet (400 mg total) by mouth 2 (two) times daily with a meal. 60 tablet 3  . lisinopril (PRINIVIL,ZESTRIL) 20 MG tablet Take 1 tablet (20 mg total) by mouth 2 (two) times daily. 180 tablet 3  . metoprolol tartrate (LOPRESSOR) 25 MG tablet TAKE ONE TABLET BY MOUTH THREE TIMES DAILY (GENERIC FOR LOPRESSOR) 270 tablet 3  . nitroGLYCERIN (NITROSTAT) 0.4 MG SL tablet Place 1 tablet (0.4 mg total) under the tongue every 5 (five) minutes as needed for chest pain. 25 tablet 12  . pantoprazole (PROTONIX) 40 MG tablet Take 1  tablet (40 mg total) by mouth 2 (two) times daily. 60 tablet 5   No current facility-administered medications for this visit.    Allergies  Allergen Reactions  . Celecoxib     Itching  . Diclofenac Other (See Comments)    GI bleed    History   Social History  . Marital Status: Married    Spouse Name: N/A  . Number of Children: N/A  . Years of Education: N/A   Occupational History  . Retired    Social History Main Topics  . Smoking status: Current Every Day Smoker -- 1.00 packs/day for 80 years    Types: Cigarettes  . Smokeless tobacco: Not on file  . Alcohol Use: 0.0 oz/week    0 Standard drinks or equivalent per week     Comment: Drinks several shots most nights  . Drug Use: No     Comment: No illicit drug use and only takes multivitamin as far as herbal medication goes.  . Sexual Activity: Not on file   Other Topics Concern  . Not on file   Social History Narrative   Lives in [place], [place] with his wife   Remains active, working around his house   Diet is regular     Family History  Problem Relation Age  of Onset  . Heart attack Mother 70  . Heart disease Mother     before age 50  . Coronary artery disease Brother   . Diabetes Brother   . Heart disease Brother   . Hyperlipidemia Brother   . Hypertension Brother   . Arrhythmia Brother     S/P cardioversion  . Heart disease Father    Physical Exam: Filed Vitals:   09/01/14 0959  BP: 160/88  Pulse: 56  Height: 5\' 9"  (1.753 m)  Weight: 78.019 kg (172 lb)    GEN- The patient is well appearing, alert and oriented x 3 today.   Head- normocephalic, atraumatic Eyes-  Sclera clear, conjunctiva pink Ears- hearing intact Oropharynx- clear Lungs- Clear to ausculation bilaterally, normal work of breathing Heart- Regular rate and rhythm, no murmurs, rubs or gallops, PMI not laterally displaced GI- soft, NT, ND, + BS, aneurysm is not palpable today Extremities- no clubbing, cyanosis, or edema  ekg  today reveals sinus rhythm   Assessment and Plan:  Atrial tachycardia   Well controlled on multaq No changes Check lfts today  CAD  Stable No change required today Fasting lipids today.  He has not tolerated statins previously due to myalgias.  May need to consider PCSK9 inhibitor depending on results.  TOBACCO ABUSE  Cessation advised  He is not ready to quit  HYPERTENSIVE Cardiovascular disease  Elevated Increase lisinopril to 20mg  bid today bmet today  Aortic aneurysm  Doing well without symptoms  He is now followed by Dr Oneida Alar   Return in 12 months

## 2014-09-26 ENCOUNTER — Other Ambulatory Visit: Payer: Self-pay | Admitting: Internal Medicine

## 2014-10-21 ENCOUNTER — Ambulatory Visit: Payer: Medicare Other | Admitting: Pharmacist

## 2014-10-23 ENCOUNTER — Other Ambulatory Visit: Payer: Self-pay | Admitting: Internal Medicine

## 2014-11-19 ENCOUNTER — Encounter: Payer: Self-pay | Admitting: Vascular Surgery

## 2014-11-20 ENCOUNTER — Other Ambulatory Visit: Payer: Self-pay | Admitting: *Deleted

## 2014-11-20 ENCOUNTER — Encounter: Payer: Self-pay | Admitting: Vascular Surgery

## 2014-11-20 ENCOUNTER — Ambulatory Visit: Payer: Medicare Other | Admitting: Pharmacist

## 2014-11-20 ENCOUNTER — Ambulatory Visit (HOSPITAL_COMMUNITY)
Admission: RE | Admit: 2014-11-20 | Discharge: 2014-11-20 | Disposition: A | Discharge status: Home / Self Care | Payer: Medicare Other | Source: Ambulatory Visit | Attending: Vascular Surgery | Admitting: Vascular Surgery

## 2014-11-20 ENCOUNTER — Ambulatory Visit (INDEPENDENT_AMBULATORY_CARE_PROVIDER_SITE_OTHER): Payer: Medicare Other | Admitting: Vascular Surgery

## 2014-11-20 ENCOUNTER — Other Ambulatory Visit: Payer: Self-pay | Admitting: Vascular Surgery

## 2014-11-20 ENCOUNTER — Encounter (HOSPITAL_COMMUNITY): Payer: Self-pay

## 2014-11-20 VITALS — BP 131/83 | HR 56 | Temp 98.3°F | Resp 14 | Ht 69.0 in | Wt 167.0 lb

## 2014-11-20 DIAGNOSIS — N281 Cyst of kidney, acquired: Secondary | ICD-10-CM | POA: Insufficient documentation

## 2014-11-20 DIAGNOSIS — I723 Aneurysm of iliac artery: Secondary | ICD-10-CM | POA: Diagnosis not present

## 2014-11-20 DIAGNOSIS — I724 Aneurysm of artery of lower extremity: Secondary | ICD-10-CM | POA: Diagnosis not present

## 2014-11-20 DIAGNOSIS — Z01810 Encounter for preprocedural cardiovascular examination: Secondary | ICD-10-CM

## 2014-11-20 DIAGNOSIS — I714 Abdominal aortic aneurysm, without rupture, unspecified: Secondary | ICD-10-CM

## 2014-11-20 DIAGNOSIS — I2510 Atherosclerotic heart disease of native coronary artery without angina pectoris: Secondary | ICD-10-CM

## 2014-11-20 DIAGNOSIS — I471 Supraventricular tachycardia: Secondary | ICD-10-CM | POA: Insufficient documentation

## 2014-11-20 DIAGNOSIS — Z955 Presence of coronary angioplasty implant and graft: Secondary | ICD-10-CM | POA: Insufficient documentation

## 2014-11-20 DIAGNOSIS — I70203 Unspecified atherosclerosis of native arteries of extremities, bilateral legs: Secondary | ICD-10-CM | POA: Insufficient documentation

## 2014-11-20 DIAGNOSIS — R071 Chest pain on breathing: Secondary | ICD-10-CM | POA: Diagnosis not present

## 2014-11-20 DIAGNOSIS — Z8546 Personal history of malignant neoplasm of prostate: Secondary | ICD-10-CM | POA: Diagnosis not present

## 2014-11-20 DIAGNOSIS — I517 Cardiomegaly: Secondary | ICD-10-CM | POA: Diagnosis not present

## 2014-11-20 DIAGNOSIS — I10 Essential (primary) hypertension: Secondary | ICD-10-CM | POA: Insufficient documentation

## 2014-11-20 LAB — CREATININE, SERUM
CREATININE: 1.04 mg/dL (ref 0.61–1.24)
GFR calc Af Amer: >60 mL/min (ref >60)
GFR calc non Af Amer: >60 mL/min (ref >60)

## 2014-11-20 MED ORDER — IOHEXOL 350 MG/ML SOLN
100.0000 mL | Freq: Once | INTRAVENOUS | Status: AC | PRN
  Administered 2014-11-20: 100 mL via INTRAVENOUS

## 2014-11-20 NOTE — Progress Notes (Signed)
[facility] [place] HISTORY AND PHYSICAL    History of Present Illness:  Patient is a 74 y.o. year old male who presents for evaluation of abdominal aortic aneurysm. The patient has a known aneurysm that has been followed for several years. It was last measured in September 2014 at 3.9 cm. The patient denies any back or abdominal pain. He did have a severe GI bleed earlier this year which required 16 units of blood. He has not had any bleeding since then. He is currently on aspirin 81 mg once daily. He is tolerating this. He does smoke and currently does not have any intentions to quit.  he had right-sided chest pain which lasted approximately 10 minutes last week. He stated the pain level was a 10 out of 10. This resolved without intervention. He does not recall if this was similar to his prior angina. He denies any chest pain currently. He has no shortness of breath currently. He states he did have an episode of increased blood pressure during the episode of chest pain.  Other medical problems include atrial tachycardia, coronary artery disease, hypertension, history of prostate cancer all of which are currently stable.    Past Medical History    Diagnosis   Date    .   Atrial tachycardia       .   CAD (coronary artery disease)             S/P PCI LAD    .   Hypertension       .   Personal history of unspecified circulatory disease             Transient Ischemic attacks    .   Prostate cancer       .   Tobacco abuse       .   AAA (abdominal aortic aneurysm)   11/04/09          3.6 cmx 3.4 cm        Past Surgical History    Procedure   Laterality   Date    .   Bare-metal stent                Left circumflex about 10 years ago    .   Pci of the lad          .   Prostate surgery            Social History History    Substance Use Topics    .   Smoking status:   Current Every Day Smoker -- 1.00 packs/day for 80 years    .   Smokeless tobacco:   Not on file    .    Alcohol Use:   Yes             Comment: Drinks several shots most nights      Family History Family History    Problem   Relation   Age of Onset    .   Heart attack   Mother   35    .   Coronary artery disease   Brother       .   Arrhythmia   Brother             S/P cardioversion      Allergies    Allergies    Allergen   Reactions    .   Celecoxib  Current Outpatient Prescriptions on File Prior to Visit  Medication Sig Dispense Refill  . aspirin EC 81 MG tablet Take 1 tablet (81 mg total) by mouth daily. 90 tablet 3  . cloNIDine (CATAPRES) 0.2 MG tablet TAKE ONE TABLET BY MOUTH TWICE DAILY 180 tablet 2  . lisinopril (PRINIVIL,ZESTRIL) 20 MG tablet Take 1 tablet (20 mg total) by mouth 2 (two) times daily. 180 tablet 3  . metoprolol tartrate (LOPRESSOR) 25 MG tablet TAKE ONE TABLET BY MOUTH THREE TIMES DAILY (GENERIC FOR LOPRESSOR) 270 tablet 3  . MULTAQ 400 MG tablet TAKE ONE TABLET BY MOUTH TWICE DAILY WITH MEALS 60 tablet 10  . nitroGLYCERIN (NITROSTAT) 0.4 MG SL tablet Place 1 tablet (0.4 mg total) under the tongue every 5 (five) minutes as needed for chest pain. 25 tablet 12  . pantoprazole (PROTONIX) 40 MG tablet Take 1 tablet (40 mg total) by mouth 2 (two) times daily. 60 tablet 5   No current facility-administered medications on file prior to visit.    ROS:    General:  No weight loss, Fever, chills  Cardiac: No recent episodes of chest pain/pressure, no shortness of breath at rest.  +shortness of breath with exertion.     Vascular: No history of rest pain in feet.  No history of claudication.  No history of non-healing ulcer, No history of DVT    Pulmonary: No home oxygen, no productive cough, no hemoptysis,  No asthma or wheezing  Hematologic: Complains of easy bruising  GI: No hematochezia and no melanoma    Physical Examination       Filed Vitals:   11/20/14 0933  BP: 131/83  Pulse: 56  Temp: 98.3 F (36.8 C)  TempSrc: Oral  Resp: 14   Height: 5\' 9"  (1.753 m)  Weight: 167 lb (75.751 kg)  SpO2: 98%   General:  Alert and oriented, no acute distress HEENT: Normal Neck: No bruit or JVD Pulmonary: Clear to auscultation bilaterally Cardiac: Regular Rate and Rhythm without murmur Abdomen: Soft, non-tender, non-distended, no mass Skin: No rash Extremity Pulses:  2+ radial, brachial, femoral, 2+ right dorsalis pedis, 2+ left posterior tibial pulse Musculoskeletal: No deformity or edema     Neurologic: Upper and lower extremity motor 5/5 and symmetric  DATA:  Patient had a repeat aortic duplex scan today. I reviewed and interpreted the scan. Aneurysm diameter today was 5 cm today compared to 4.7 cm 6 months ago.   ASSESSMENT: 5 cm abdominal aortic aneurysm asymptomatic. Recent right side chest pain possible angina   PLAN: Aneurysm has not grown from 4.75 cm by ultrasound. We will obtain CT angiogram the abdomen and pelvis to confirm this. The patient will follow-up with me after his CT scan.  Patient to see Dr. Rayann Heman in the near future for recent episode of chest pain in light of history of prior coronary stenting and possibility of abdominal aortic aneurysm repair in the near future  Ruta Hinds, MD [facility] [place]: [phone] Pager: [phone]

## 2014-11-20 NOTE — Addendum Note (Signed)
Addended by: Mena Goes on: 11/20/2014 11:06 AM   Modules accepted: Orders

## 2014-11-21 ENCOUNTER — Other Ambulatory Visit: Payer: Self-pay

## 2014-11-25 ENCOUNTER — Encounter: Payer: Self-pay | Admitting: Vascular Surgery

## 2014-11-25 ENCOUNTER — Telehealth: Payer: Self-pay | Admitting: Internal Medicine

## 2014-11-25 NOTE — Telephone Encounter (Signed)
New message     Calling to see if we received a clearance form from Dr Oneida Alar.  Pt was seen recently and need surgery soon.

## 2014-11-25 NOTE — Telephone Encounter (Signed)
Left message to call back  

## 2014-11-25 NOTE — Telephone Encounter (Signed)
Spoke with pt and wife and informed them that I did not see a clearance in Dr. Jackalyn Lombard box but that [facility]ings could have it. Wife states that pt was seen at Dr. Nona Dell office last week and then had a CT and they pushed his follow up appt to this Thurs. Wife states that she didn't know if pt needed to be seen by Dr. Rayann Heman sooner than his 9/26 scheduled appt. Advised wife to have Dr. Nona Dell office contact our office in regards to the specifics on the surgery.  Wife agreeable. Wife and pt would like for [facility] to call them back and discuss possibly having pt seen in our office sooner. Wife says that they are living at [facility] right now and that if [facility] is unable to contact them on cell number to try the campground number [phone]. Will route to [place].

## 2014-11-26 NOTE — Telephone Encounter (Signed)
Dionicio Stall, RN at 11/26/2014 12:01 PM     Status: Signed       Expand All Collapse All   Left message on voicemail for patient's wife that no need to move appointment up Will see him as scheduled       Wife is aware

## 2014-11-26 NOTE — Telephone Encounter (Signed)
F/u    Pt stated to call back at [phone].

## 2014-11-26 NOTE — Telephone Encounter (Signed)
Left message on voicemail for patient's wife that no need to move appointment up  Will see him as scheduled

## 2014-11-27 ENCOUNTER — Encounter: Payer: Self-pay | Admitting: Vascular Surgery

## 2014-11-27 ENCOUNTER — Ambulatory Visit (INDEPENDENT_AMBULATORY_CARE_PROVIDER_SITE_OTHER): Payer: Medicare Other | Admitting: Vascular Surgery

## 2014-11-27 VITALS — BP 154/90 | HR 61 | Temp 97.9°F | Resp 18 | Ht 70.0 in | Wt 168.0 lb

## 2014-11-27 DIAGNOSIS — I714 Abdominal aortic aneurysm, without rupture, unspecified: Secondary | ICD-10-CM

## 2014-11-27 DIAGNOSIS — I724 Aneurysm of artery of lower extremity: Secondary | ICD-10-CM | POA: Diagnosis not present

## 2014-11-27 DIAGNOSIS — I2510 Atherosclerotic heart disease of native coronary artery without angina pectoris: Secondary | ICD-10-CM | POA: Diagnosis not present

## 2014-11-27 NOTE — Progress Notes (Signed)
[facility] [place] HISTORY AND PHYSICAL    History of Present Illness:  Patient is a 74 year old male who presents for follow-up evaluation of abdominal aortic aneurysm. The patient has a known aneurysm that has been followed for several years. It was last measured in September 2014 at 3.9 cm. The patient denies any back or abdominal pain. He did have a severe GI bleed earlier this year which required 16 units of blood. He has not had any bleeding since then. He is currently on aspirin 81 mg once daily. He is tolerating this. He does smoke and currently does not have any intentions to quit.  He had right-sided chest pain which lasted approximately 10 minutes last week. He stated the pain level was a 10 out of 10. This resolved without intervention. He does not recall if this was similar to his prior angina. He denies any chest pain currently. He has no shortness of breath currently. He states he did have an episode of increased blood pressure during the episode of chest pain.  Other medical problems include atrial tachycardia, coronary artery disease, hypertension, history of prostate cancer all of which are currently stable. He returns today for follow-up after recent CT angiogram of the abdomen and pelvis.    Past Medical History     Diagnosis    Date     .    Atrial tachycardia         .    CAD (coronary artery disease)                 S/P PCI LAD     .    Hypertension         .    Personal history of unspecified circulatory disease                 Transient Ischemic attacks     .    Prostate cancer         .    Tobacco abuse         .    AAA (abdominal aortic aneurysm)    11/04/09             3.6 cmx 3.4 cm         Past Surgical History     Procedure    Laterality    Date     .    Bare-metal stent                     Left circumflex about 10 years ago     .    Pci of the lad             .    Prostate surgery               Social History History     Substance Use  Topics     .    Smoking status:    Current Every Day Smoker -- 1.00 packs/day for 80 years     .    Smokeless tobacco:    Not on file     .    Alcohol Use:    Yes                 Comment: Drinks several shots most nights       Family History Family History     Problem    Relation    Age of Onset     .  Heart attack    Mother    24     .    Coronary artery disease    Brother         .    Arrhythmia    Brother                 S/P cardioversion       Allergies    Allergies     Allergen    Reactions     .    Celecoxib              Current Outpatient Prescriptions on File Prior to Visit   Medication  Sig  Dispense  Refill   .  aspirin EC 81 MG tablet  Take 1 tablet (81 mg total) by mouth daily.  90 tablet  3   .  cloNIDine (CATAPRES) 0.2 MG tablet  TAKE ONE TABLET BY MOUTH TWICE DAILY  180 tablet  2   .  lisinopril (PRINIVIL,ZESTRIL) 20 MG tablet  Take 1 tablet (20 mg total) by mouth 2 (two) times daily.  180 tablet  3   .  metoprolol tartrate (LOPRESSOR) 25 MG tablet  TAKE ONE TABLET BY MOUTH THREE TIMES DAILY (GENERIC FOR LOPRESSOR)  270 tablet  3   .  MULTAQ 400 MG tablet  TAKE ONE TABLET BY MOUTH TWICE DAILY WITH MEALS  60 tablet  10   .  nitroGLYCERIN (NITROSTAT) 0.4 MG SL tablet  Place 1 tablet (0.4 mg total) under the tongue every 5 (five) minutes as needed for chest pain.  25 tablet  12   .  pantoprazole (PROTONIX) 40 MG tablet  Take 1 tablet (40 mg total) by mouth 2 (two) times daily.  60 tablet  5      No current facility-administered medications on file prior to visit.     ROS:    General:  No weight loss, Fever, chills  Cardiac: No recent episodes of chest pain/pressure, no shortness of breath at rest.  +shortness of breath with exertion.     Vascular: No history of rest pain in feet.  No history of claudication.  No history of non-healing ulcer, No history of DVT    Pulmonary: No home oxygen, no productive cough, no hemoptysis,  No asthma or  wheezing  Hematologic: Complains of easy bruising  GI: No hematochezia and no melanoma    Physical Examination       Filed Vitals:   11/27/14 1244 11/27/14 1246  BP: 166/82 154/90  Pulse: 61 61  Temp: 97.9 F (36.6 C)   Resp: 18   Height: 5\' 10"  (1.778 m)   Weight: 168 lb (76.204 kg)   SpO2: 99%    General:  Alert and oriented, no acute distress HEENT: Normal Neck: No bruit or JVD Pulmonary: Clear to auscultation bilaterally Cardiac: Regular Rate and Rhythm without murmur Abdomen: Soft, non-tender, non-distended, no mass Skin: No rash Extremity Pulses:  2+ radial, brachial, femoral, 2+ right dorsalis pedis, 2+ left posterior tibial pulse Musculoskeletal: No deformity or edema     Neurologic: Upper and lower extremity motor 5/5 and symmetric  DATA:  CT Angio is reviewed today. Aortic diameter is 5.1 cm. There is no evidence of rupture. Left common iliac artery slightly ectatic. 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 Celiac SMA and renal arteries are widely patent. There is a large rind of thrombus within the infrarenal aorta. This extends up to the level of the  left renal artery and encroaches upon it.   ASSESSMENT: 5.1 cm abdominal aortic aneurysm asymptomatic. 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   PLAN: The patient is not a candidate for aneurysm stent graft repair due to his left common femoral artery aneurysm as well as the large amount of thrombus load within the aneurysm which would probably prevent [place] from establishing a good proximal seal as well as potentially embolizing the left renal artery. I offered the patient today the options of continued observation with a repeat ultrasound in 6 months time and consider repair if the aneurysm got to 5-1/2 cm in diameter. I also offered him open repair at this point which would require a suprarenal clamp to protect the left renal artery at least initially until the proximal anastomosis was completed. Most  likely this would be an aortobiiliac graft and then also in the same setting do a left groin incision and replace the left common femoral artery with an interposition graft to repair the left common femoral artery aneurysm.  Patient to see Dr. Rayann Heman in the near future for recent episode of chest pain in light of history of prior coronary stenting and possibility of abdominal aortic aneurysm repair in the near future.  The patient will call me if he wishes to consider open repair at this point. Otherwise I will see him again in 6 months time.  Ruta Hinds, MD [facility] [place] Office: [phone] Pager: [phone]

## 2014-11-27 NOTE — Progress Notes (Signed)
Filed Vitals:   11/27/14 1244 11/27/14 1246  BP: 166/82 154/90  Pulse: 61 61  Temp: 97.9 F (36.6 C)   Resp: 18   Height: 5\' 10"  (1.778 m)   Weight: 168 lb (76.204 kg)   SpO2: 99%

## 2014-12-03 ENCOUNTER — Encounter: Payer: Self-pay | Admitting: *Deleted

## 2014-12-04 ENCOUNTER — Other Ambulatory Visit: Payer: Self-pay

## 2014-12-04 ENCOUNTER — Ambulatory Visit: Payer: Medicare Other | Admitting: Vascular Surgery

## 2014-12-08 ENCOUNTER — Ambulatory Visit (INDEPENDENT_AMBULATORY_CARE_PROVIDER_SITE_OTHER): Payer: Medicare Other | Admitting: Internal Medicine

## 2014-12-08 ENCOUNTER — Encounter (HOSPITAL_COMMUNITY): Payer: Self-pay

## 2014-12-08 ENCOUNTER — Encounter: Payer: Self-pay | Admitting: Internal Medicine

## 2014-12-08 VITALS — BP 156/86 | HR 60 | Ht 71.0 in | Wt 168.8 lb

## 2014-12-08 DIAGNOSIS — Z79899 Other long term (current) drug therapy: Secondary | ICD-10-CM | POA: Diagnosis not present

## 2014-12-08 DIAGNOSIS — F17200 Nicotine dependence, unspecified, uncomplicated: Secondary | ICD-10-CM | POA: Diagnosis not present

## 2014-12-08 DIAGNOSIS — Z01818 Encounter for other preprocedural examination: Secondary | ICD-10-CM | POA: Diagnosis present

## 2014-12-08 DIAGNOSIS — I2510 Atherosclerotic heart disease of native coronary artery without angina pectoris: Secondary | ICD-10-CM | POA: Insufficient documentation

## 2014-12-08 DIAGNOSIS — I119 Hypertensive heart disease without heart failure: Secondary | ICD-10-CM | POA: Diagnosis not present

## 2014-12-08 DIAGNOSIS — Z0000 Encounter for general adult medical examination without abnormal findings: Secondary | ICD-10-CM | POA: Insufficient documentation

## 2014-12-08 DIAGNOSIS — I714 Abdominal aortic aneurysm, without rupture, unspecified: Secondary | ICD-10-CM

## 2014-12-08 DIAGNOSIS — C61 Malignant neoplasm of prostate: Secondary | ICD-10-CM | POA: Diagnosis not present

## 2014-12-08 DIAGNOSIS — Z720 Tobacco use: Secondary | ICD-10-CM

## 2014-12-08 DIAGNOSIS — Z7982 Long term (current) use of aspirin: Secondary | ICD-10-CM | POA: Diagnosis not present

## 2014-12-08 DIAGNOSIS — Z01812 Encounter for preprocedural laboratory examination: Secondary | ICD-10-CM | POA: Diagnosis not present

## 2014-12-08 DIAGNOSIS — I471 Supraventricular tachycardia: Secondary | ICD-10-CM

## 2014-12-08 DIAGNOSIS — Z8673 Personal history of transient ischemic attack (TIA), and cerebral infarction without residual deficits: Secondary | ICD-10-CM | POA: Diagnosis not present

## 2014-12-08 DIAGNOSIS — Z01810 Encounter for preprocedural cardiovascular examination: Secondary | ICD-10-CM

## 2014-12-08 DIAGNOSIS — I25118 Atherosclerotic heart disease of native coronary artery with other forms of angina pectoris: Secondary | ICD-10-CM

## 2014-12-08 DIAGNOSIS — I252 Old myocardial infarction: Secondary | ICD-10-CM | POA: Diagnosis not present

## 2014-12-08 DIAGNOSIS — I10 Essential (primary) hypertension: Secondary | ICD-10-CM | POA: Diagnosis not present

## 2014-12-08 DIAGNOSIS — Z0183 Encounter for blood typing: Secondary | ICD-10-CM | POA: Diagnosis not present

## 2014-12-08 LAB — COMPREHENSIVE METABOLIC PANEL
ALBUMIN: 3.5 g/dL (ref 3.5–5.0)
ALT: 19 U/L (ref 17–63)
AST: 16 U/L (ref 15–41)
Alkaline Phosphatase: 110 U/L (ref 38–126)
Anion gap: 9 (ref 5–15)
BUN: 15 mg/dL (ref 6–20)
CHLORIDE: 103 mmol/L (ref 101–111)
CO2: 23 mmol/L (ref 22–32)
CREATININE: 0.92 mg/dL (ref 0.61–1.24)
Calcium: 8.8 mg/dL — ABNORMAL LOW (ref 8.9–10.3)
GFR calc Af Amer: >60 mL/min (ref >60)
GFR calc non Af Amer: >60 mL/min (ref >60)
Glucose, Bld: 82 mg/dL (ref 65–99)
Potassium: 4.2 mmol/L (ref 3.5–5.1)
SODIUM: 135 mmol/L (ref 135–145)
Total Bilirubin: 0.6 mg/dL (ref 0.3–1.2)
Total Protein: 6.5 g/dL (ref 6.5–8.1)

## 2014-12-08 LAB — URINALYSIS, ROUTINE W REFLEX MICROSCOPIC
Bilirubin Urine: NEGATIVE
GLUCOSE, UA: NEGATIVE mg/dL
KETONES UR: NEGATIVE mg/dL
LEUKOCYTES UA: NEGATIVE
NITRITE: NEGATIVE
PROTEIN: NEGATIVE mg/dL
Specific Gravity, Urine: 1.018 (ref 1.005–1.030)
Urobilinogen, UA: 0.2 mg/dL (ref 0.0–1.0)
pH: 5.5 (ref 5.0–8.0)

## 2014-12-08 LAB — URINE MICROSCOPIC-ADD ON

## 2014-12-08 LAB — CBC
HCT: 41.4 % (ref 39.0–52.0)
Hemoglobin: 14 g/dL (ref 13.0–17.0)
MCH: 28.4 pg (ref 26.0–34.0)
MCHC: 33.8 g/dL (ref 30.0–36.0)
MCV: 84 fL (ref 78.0–100.0)
PLATELETS: 188 10*3/uL (ref 150–400)
RBC: 4.93 MIL/uL (ref 4.22–5.81)
RDW: 15.9 % — ABNORMAL HIGH (ref 11.5–15.5)
WBC: 9.1 10*3/uL (ref 4.0–10.5)

## 2014-12-08 LAB — BLOOD GAS, ARTERIAL
ACID-BASE EXCESS: 0.1 mmol/L (ref 0.0–2.0)
Bicarbonate: 23.9 mEq/L (ref 20.0–24.0)
DRAWN BY: 42180
FIO2: 0.21
O2 SAT: 94.5 %
PCO2 ART: 36.8 mmHg (ref 35.0–45.0)
Patient temperature: 98.6
TCO2: 25.1 mmol/L (ref 0–100)
pH, Arterial: 7.429 (ref 7.350–7.450)
pO2, Arterial: 69.4 mmHg — ABNORMAL LOW (ref 80.0–100.0)

## 2014-12-08 LAB — SURGICAL PCR SCREEN
MRSA, PCR: NEGATIVE
Staphylococcus aureus: NEGATIVE

## 2014-12-08 LAB — PROTIME-INR
INR: 1.01 (ref 0.00–1.49)
Prothrombin Time: 13.5 seconds (ref 11.6–15.2)

## 2014-12-08 LAB — APTT: APTT: 27 s (ref 24–37)

## 2014-12-08 MED ORDER — AMLODIPINE BESYLATE 2.5 MG PO TABS
2.5000 mg | ORAL_TABLET | Freq: Every day | ORAL | Status: DC
Start: 2014-12-08 — End: 2014-12-22

## 2014-12-08 NOTE — Patient Instructions (Addendum)
Medication Instructions: 1) Start norvasc (amlodipine) 2.5 mg one tablet by mouth once daily  Labwork: - none  Procedures/Testing: - Your physician has requested that you have a lexiscan myoview. For further information please visit [email or web address]. Please follow instruction sheet, as given.  Follow-Up: - Your physician recommends that you schedule a follow-up appointment in: 2 months with Dr. Rayann Heman.  Any Additional Special Instructions Will Be Listed Below (If Applicable). - none

## 2014-12-08 NOTE — Progress Notes (Signed)
PCP: Dr Lanny Cramp in [facility])  The patient presents today for cardiology followup.  He is planned for aortic aneurysm repair next week.  He presents today for preoperative clearance.  He remains active.  He continues to have SOB.  He has had one episode of chest pain about 2 weeks ago at night.  Today, he denies symptoms of palpitations, orthopnea, PND, lower extremity edema, syncope, or neurologic sequela.  The patient feels that he is tolerating medications without difficulties and is otherwise without complaint today.   Past Medical History  Diagnosis Date  . Atrial tachycardia   . CAD (coronary artery disease)     S/P PCI LAD  . Hypertension   . Personal history of unspecified circulatory disease     Transient Ischemic attacks  . Prostate cancer   . Tobacco abuse   . AAA (abdominal aortic aneurysm) 11/04/09    3.6 cmx 3.4 cm  . Anemia   . Stroke   . Myocardial infarction   . Heart murmur     in 1961  . Kidney stones    Past Surgical History  Procedure Laterality Date  . Bare-metal stent      Left circumflex about 10 years ago  . Percutaneous coronary stent intervention (pci-s)      LAD  . Prostate surgery    . Tonsillectomy    . Cardiac catheterization      11/05/2008    Current Outpatient Prescriptions  Medication Sig Dispense Refill  . aspirin EC 81 MG tablet Take 1 tablet (81 mg total) by mouth daily. 90 tablet 3  . cloNIDine (CATAPRES) 0.2 MG tablet TAKE ONE TABLET BY MOUTH TWICE DAILY 180 tablet 2  . lisinopril (PRINIVIL,ZESTRIL) 20 MG tablet Take 1 tablet (20 mg total) by mouth 2 (two) times daily. 180 tablet 3  . metoprolol tartrate (LOPRESSOR) 25 MG tablet TAKE ONE TABLET BY MOUTH THREE TIMES DAILY (GENERIC FOR LOPRESSOR) 270 tablet 3  . MULTAQ 400 MG tablet TAKE ONE TABLET BY MOUTH TWICE DAILY WITH MEALS 60 tablet 10  . Multiple Vitamins-Minerals (MULTIVITAMIN WITH MINERALS) tablet Take 1 tablet by mouth daily.    . nitroGLYCERIN (NITROSTAT) 0.4 MG SL tablet  Place 1 tablet (0.4 mg total) under the tongue every 5 (five) minutes as needed for chest pain. 25 tablet 12  . pantoprazole (PROTONIX) 40 MG tablet Take 1 tablet (40 mg total) by mouth 2 (two) times daily. 60 tablet 5  . amLODipine (NORVASC) 2.5 MG tablet Take 1 tablet (2.5 mg total) by mouth daily. 90 tablet 3   No current facility-administered medications for this visit.    Allergies  Allergen Reactions  . Celecoxib Itching    Itching  . Diclofenac Other (See Comments)    GI bleed    Social History   Social History  . Marital Status: Married    Spouse Name: N/A  . Number of Children: N/A  . Years of Education: N/A   Occupational History  . Retired    Social History Main Topics  . Smoking status: Current Every Day Smoker -- 1.00 packs/day for 80 years    Types: Cigarettes  . Smokeless tobacco: Never Used  . Alcohol Use: No     Comment: " No alcohol in over a year."  . Drug Use: No     Comment: No illicit drug use and only takes multivitamin as far as herbal medication goes.  . Sexual Activity: Not on file   Other Topics Concern  .  Not on file   Social History Narrative   Lives in [place], [place] with his wife   Remains active, working around his house   Diet is regular     Family History  Problem Relation Age of Onset  . Heart attack Mother 3  . Heart disease Mother     < 47  . Coronary artery disease Brother   . Diabetes Brother   . Heart disease Brother     CABG  . Hyperlipidemia Brother   . Hypertension Brother   . Arrhythmia Brother     S/P cardioversion  . Heart disease Father    Physical Exam: Filed Vitals:   12/08/14 1212  BP: 156/86  Pulse: 60  Height: 5\' 11"  (1.803 m)  Weight: 168 lb 12.8 oz (76.567 kg)    GEN- The patient is well appearing, alert and oriented x 3 today.   Head- normocephalic, atraumatic Eyes-  Sclera clear, conjunctiva pink Ears- hearing intact Oropharynx- clear Lungs- Clear to ausculation bilaterally,  normal work of breathing Heart- Regular rate and rhythm, no murmurs, rubs or gallops, PMI not laterally displaced GI- soft, NT, ND, + BS, aneurysm is not palpable today Extremities- no clubbing, cyanosis, or edema  ekg today reveals sinus rhythm, nonspecific ST/T changes  Assessment and Plan:  Atrial tachycardia   Well controlled on multaq No changes   CAD  Stable Given recent chest pain, will proceed with lexiscan myoview prior to upcoming aortic aneurysm repair  TOBACCO ABUSE  Cessation advised  He is not ready to quit  HYPERTENSIVE Cardiovascular disease  Elevated Restart norvasc 2.5mg  daily  Aortic aneurysm  If low risk myoview, proceed with surgery without further CV testing.  IF high risk, further risk stratification may be required.  Return in 6 weeks to follow-up posteroperatively  Thompson Grayer MD, [facility] 12/08/2014 10:08 PM

## 2014-12-09 ENCOUNTER — Telehealth (HOSPITAL_COMMUNITY): Payer: Self-pay | Admitting: *Deleted

## 2014-12-09 ENCOUNTER — Telehealth: Payer: Self-pay | Admitting: Internal Medicine

## 2014-12-09 NOTE — Progress Notes (Addendum)
Anesthesia Chart Review:  Pt is 74 year old male scheduled for abdominal aortic aneurysm repair on 12/15/2014 with Dr. Oneida Alar.   Cardiologist is Dr. Rayann Heman.   PMH includes: CAD (s/p PCI to LAD), MI, atrial tachycardia, HTN, stroke, TIA, heart murmur, anemia, prostate cancer. Current smoker. BMI 23.5.   Medications includes: amlodopine, ASA, clonidine, lisinopril, metoprolol, multaq, protonix.   Preoperative labs reviewed.    EKG 09/01/2014: NSR.   Echo 08/19/2013:  -Global LV systolic function is preserved. EF 63% -RV is normal in size and function -There is aortic valve sclerosis. The aortic valve opens well. There is focal thickening of the non-coronary aortic cusp -Mitral valve is normal in structure and function. No MR, no vegetation see on the mitral valve -No pericardial effusion.   Pt reports recent chest pain, so Dr. Rayann Heman ordered a nuclear stress test (scheduled for 12/10/2014). Will revisit chart after those results are available.   Willeen Cass, FNP-BC [facility]/Anesthesiology Phone: [phone] 12/09/2014 3:21 PM  Addendum:   Nuclear stress test 12/10/2014: -Intermediate risk study.  -Compared to 2013, there appears to a new inferior wall perfusion defect and associated hypokinesis, with mild to moderate reduction in overall systolic function. -While the defect appears mostly fixed, rather than reversible, the presence of severe interference from visceral tracer activity reduces the confidence in this evaluation.  Dr. Rayann Heman comments on the stress test results that he reviewed the results and feels it is a low risk study. Gave pt cardiac clearance for surgery.   If no changes, I anticipate pt can proceed with surgery as scheduled.   Willeen Cass, FNP-BC [facility]/Anesthesiology Phone: [phone] 12/11/2014 12:07 PM

## 2014-12-09 NOTE — Telephone Encounter (Signed)
Patient given detailed instructions per Myocardial Perfusion Study Information Sheet for test on 12/10/14 at 12:00. Patient notified to arrive 15 minutes early and that it is imperative to arrive on time for appointment to keep from having the test rescheduled.  If you need to cancel or reschedule your appointment, please call the office within 24 hours of your appointment. Failure to do so may result in a cancellation of your appointment, and a $50 no show fee. Patient verbalized understanding. Jerry Gilbert

## 2014-12-09 NOTE — Telephone Encounter (Signed)
New message      Talk to [facility] regarding the norvasc.  She would not tell me any thing else

## 2014-12-09 NOTE — Progress Notes (Signed)
Pt denies SOB and chest pain but is under the care of Dr. Rayann Heman, cardiology. According to pt, he is scheduled to have a stress test this weekend. Pt stated that he takes Aspirin in the evening. Pt chart forwarded to [place], [place], anesthesia, to review cardiac history and recent C/O chest pain ( per pt and Dr. Oneida Alar note 11/27/14).

## 2014-12-09 NOTE — Telephone Encounter (Signed)
Spoke with wife and she is very nervous about starting the Norvasc 2.5mg  prior to stress test.  Says last time he took it his BP dropped and she does not want to start prior to stress test.  I let her know that 2.5mg  is a low dose and to try and take if possible

## 2014-12-10 ENCOUNTER — Ambulatory Visit (HOSPITAL_BASED_OUTPATIENT_CLINIC_OR_DEPARTMENT_OTHER): Payer: Medicare Other

## 2014-12-10 ENCOUNTER — Encounter (HOSPITAL_COMMUNITY)
Admission: RE | Admit: 2014-12-10 | Discharge: 2014-12-10 | Disposition: A | Discharge status: Home / Self Care | Payer: Medicare Other | Source: Ambulatory Visit | Attending: Vascular Surgery | Admitting: Vascular Surgery

## 2014-12-10 DIAGNOSIS — Z8673 Personal history of transient ischemic attack (TIA), and cerebral infarction without residual deficits: Secondary | ICD-10-CM | POA: Insufficient documentation

## 2014-12-10 DIAGNOSIS — F17200 Nicotine dependence, unspecified, uncomplicated: Secondary | ICD-10-CM | POA: Insufficient documentation

## 2014-12-10 DIAGNOSIS — Z01812 Encounter for preprocedural laboratory examination: Secondary | ICD-10-CM | POA: Insufficient documentation

## 2014-12-10 DIAGNOSIS — Z0183 Encounter for blood typing: Secondary | ICD-10-CM | POA: Insufficient documentation

## 2014-12-10 DIAGNOSIS — Z7982 Long term (current) use of aspirin: Secondary | ICD-10-CM | POA: Insufficient documentation

## 2014-12-10 DIAGNOSIS — Z01818 Encounter for other preprocedural examination: Secondary | ICD-10-CM | POA: Diagnosis not present

## 2014-12-10 DIAGNOSIS — I2510 Atherosclerotic heart disease of native coronary artery without angina pectoris: Secondary | ICD-10-CM

## 2014-12-10 DIAGNOSIS — Z79899 Other long term (current) drug therapy: Secondary | ICD-10-CM | POA: Insufficient documentation

## 2014-12-10 DIAGNOSIS — I10 Essential (primary) hypertension: Secondary | ICD-10-CM | POA: Insufficient documentation

## 2014-12-10 DIAGNOSIS — I714 Abdominal aortic aneurysm, without rupture: Secondary | ICD-10-CM | POA: Insufficient documentation

## 2014-12-10 DIAGNOSIS — I252 Old myocardial infarction: Secondary | ICD-10-CM | POA: Insufficient documentation

## 2014-12-10 DIAGNOSIS — C61 Malignant neoplasm of prostate: Secondary | ICD-10-CM | POA: Insufficient documentation

## 2014-12-10 HISTORY — DX: Calculus of kidney: N20.0

## 2014-12-10 HISTORY — DX: Cardiac murmur, unspecified: R01.1

## 2014-12-10 LAB — MYOCARDIAL PERFUSION IMAGING
CHL CUP NUCLEAR SDS: 7
CHL CUP NUCLEAR SRS: 3
CHL CUP NUCLEAR SSS: 10
CHL CUP RESTING HR STRESS: 50 {beats}/min
LV dias vol: 140 mL
LV sys vol: 81 mL
Peak HR: 81 {beats}/min
RATE: 0.12
TID: 1.22

## 2014-12-10 MED ORDER — TECHNETIUM TC 99M SESTAMIBI GENERIC - CARDIOLITE
9.9000 | Freq: Once | INTRAVENOUS | Status: AC | PRN
  Administered 2014-12-10: 10 via INTRAVENOUS

## 2014-12-10 MED ORDER — REGADENOSON 0.4 MG/5ML IV SOLN
0.4000 mg | Freq: Once | INTRAVENOUS | Status: AC
  Administered 2014-12-10: 0.4 mg via INTRAVENOUS

## 2014-12-10 MED ORDER — TECHNETIUM TC 99M SESTAMIBI GENERIC - CARDIOLITE
32.4000 | Freq: Once | INTRAVENOUS | Status: AC | PRN
  Administered 2014-12-10: 32 via INTRAVENOUS

## 2014-12-11 ENCOUNTER — Ambulatory Visit: Payer: Medicare Other | Admitting: Vascular Surgery

## 2014-12-14 MED ORDER — DEXTROSE 5 % IV SOLN
1.5000 g | INTRAVENOUS | Status: AC
  Administered 2014-12-15: 1.5 g via INTRAVENOUS
  Administered 2014-12-15: .75 g via INTRAVENOUS
  Filled 2014-12-14: qty 1.5

## 2014-12-14 NOTE — Anesthesia Preprocedure Evaluation (Addendum)
Anesthesia Evaluation  Patient identified by MRN, date of birth, ID band Patient awake    Reviewed: Allergy & Precautions, NPO status , Patient's Chart, lab work & pertinent test results  Airway Mallampati: II  TM Distance: >3 FB Neck ROM: Full    Dental  (+) Dental Advisory Given, Edentulous Upper, Edentulous Lower   Pulmonary shortness of breath, Current Smoker,     + decreased breath sounds      Cardiovascular hypertension, Pt. on medications + CAD, + Past MI and + Peripheral Vascular Disease  + dysrhythmias + Valvular Problems/Murmurs  Rhythm:Regular Rate:Normal - Systolic murmurs, - Diastolic murmurs and - Friction Rub    Neuro/Psych PSYCHIATRIC DISORDERS Depression CVA    GI/Hepatic Neg liver ROS, PUD,   Endo/Other  negative endocrine ROS  Renal/GU Renal disease     Musculoskeletal negative musculoskeletal ROS (+)   Abdominal   Peds  Hematology negative hematology ROS (+) anemia ,   Anesthesia Other Findings   Reproductive/Obstetrics                        Anesthesia Physical Anesthesia Plan  ASA: IV  Anesthesia Plan: General   Post-op Pain Management:    Induction: Intravenous  Airway Management Planned: Oral ETT  Additional Equipment: Arterial line, CVP and Ultrasound Guidance Line Placement  Intra-op Plan:   Post-operative Plan: Possible Post-op intubation/ventilation  Informed Consent: I have reviewed the patients History and Physical, chart, labs and discussed the procedure including the risks, benefits and alternatives for the proposed anesthesia with the patient or authorized representative who has indicated his/her understanding and acceptance.   Dental advisory given  Plan Discussed with: CRNA  Anesthesia Plan Comments:         Anesthesia Quick Evaluation

## 2014-12-15 ENCOUNTER — Encounter (HOSPITAL_COMMUNITY)
Admission: RE | Disposition: A | Discharge status: Home / Self Care | Payer: Medicare Other | Source: Ambulatory Visit | Attending: Vascular Surgery

## 2014-12-15 ENCOUNTER — Inpatient Hospital Stay (HOSPITAL_COMMUNITY)
Admission: RE | Admit: 2014-12-15 | Discharge: 2014-12-22 | DRG: 269 | Disposition: A | Discharge status: Home / Self Care | Payer: Medicare Other | Source: Ambulatory Visit | Attending: Vascular Surgery | Admitting: Vascular Surgery

## 2014-12-15 ENCOUNTER — Inpatient Hospital Stay (HOSPITAL_COMMUNITY): Payer: Medicare Other | Admitting: Emergency Medicine

## 2014-12-15 ENCOUNTER — Encounter (HOSPITAL_COMMUNITY): Payer: Self-pay | Admitting: *Deleted

## 2014-12-15 ENCOUNTER — Inpatient Hospital Stay (HOSPITAL_COMMUNITY): Payer: Medicare Other | Admitting: Certified Registered Nurse Anesthetist

## 2014-12-15 ENCOUNTER — Inpatient Hospital Stay (HOSPITAL_COMMUNITY): Payer: Medicare Other

## 2014-12-15 DIAGNOSIS — Z79899 Other long term (current) drug therapy: Secondary | ICD-10-CM

## 2014-12-15 DIAGNOSIS — D62 Acute posthemorrhagic anemia: Secondary | ICD-10-CM | POA: Diagnosis not present

## 2014-12-15 DIAGNOSIS — E871 Hypo-osmolality and hyponatremia: Secondary | ICD-10-CM | POA: Diagnosis not present

## 2014-12-15 DIAGNOSIS — I714 Abdominal aortic aneurysm, without rupture, unspecified: Secondary | ICD-10-CM | POA: Diagnosis present

## 2014-12-15 DIAGNOSIS — I4891 Unspecified atrial fibrillation: Secondary | ICD-10-CM | POA: Diagnosis not present

## 2014-12-15 DIAGNOSIS — I471 Supraventricular tachycardia: Secondary | ICD-10-CM | POA: Diagnosis present

## 2014-12-15 DIAGNOSIS — I724 Aneurysm of artery of lower extremity: Secondary | ICD-10-CM | POA: Diagnosis present

## 2014-12-15 DIAGNOSIS — Z7982 Long term (current) use of aspirin: Secondary | ICD-10-CM | POA: Diagnosis not present

## 2014-12-15 DIAGNOSIS — Z8546 Personal history of malignant neoplasm of prostate: Secondary | ICD-10-CM

## 2014-12-15 DIAGNOSIS — I252 Old myocardial infarction: Secondary | ICD-10-CM | POA: Diagnosis not present

## 2014-12-15 DIAGNOSIS — I119 Hypertensive heart disease without heart failure: Secondary | ICD-10-CM

## 2014-12-15 DIAGNOSIS — Z8249 Family history of ischemic heart disease and other diseases of the circulatory system: Secondary | ICD-10-CM | POA: Diagnosis not present

## 2014-12-15 DIAGNOSIS — Z955 Presence of coronary angioplasty implant and graft: Secondary | ICD-10-CM | POA: Diagnosis not present

## 2014-12-15 DIAGNOSIS — Z888 Allergy status to other drugs, medicaments and biological substances status: Secondary | ICD-10-CM

## 2014-12-15 DIAGNOSIS — Z8673 Personal history of transient ischemic attack (TIA), and cerebral infarction without residual deficits: Secondary | ICD-10-CM

## 2014-12-15 DIAGNOSIS — I480 Paroxysmal atrial fibrillation: Secondary | ICD-10-CM | POA: Diagnosis not present

## 2014-12-15 DIAGNOSIS — Z9889 Other specified postprocedural states: Secondary | ICD-10-CM

## 2014-12-15 DIAGNOSIS — F17200 Nicotine dependence, unspecified, uncomplicated: Secondary | ICD-10-CM | POA: Diagnosis present

## 2014-12-15 DIAGNOSIS — I2510 Atherosclerotic heart disease of native coronary artery without angina pectoris: Secondary | ICD-10-CM | POA: Diagnosis present

## 2014-12-15 DIAGNOSIS — I10 Essential (primary) hypertension: Secondary | ICD-10-CM | POA: Diagnosis present

## 2014-12-15 HISTORY — PX: ABDOMINAL AORTIC ANEURYSM REPAIR: SHX42

## 2014-12-15 LAB — CBC
HEMATOCRIT: 35 % — AB (ref 39.0–52.0)
HEMOGLOBIN: 11.4 g/dL — AB (ref 13.0–17.0)
MCH: 27.6 pg (ref 26.0–34.0)
MCHC: 32.6 g/dL (ref 30.0–36.0)
MCV: 84.7 fL (ref 78.0–100.0)
Platelets: 149 10*3/uL — ABNORMAL LOW (ref 150–400)
RBC: 4.13 MIL/uL — ABNORMAL LOW (ref 4.22–5.81)
RDW: 16.1 % — ABNORMAL HIGH (ref 11.5–15.5)
WBC: 14.3 10*3/uL — AB (ref 4.0–10.5)

## 2014-12-15 LAB — MAGNESIUM: Magnesium: 1.5 mg/dL — ABNORMAL LOW (ref 1.7–2.4)

## 2014-12-15 LAB — BASIC METABOLIC PANEL
Anion gap: 5 (ref 5–15)
BUN: 15 mg/dL (ref 6–20)
CALCIUM: 7.7 mg/dL — AB (ref 8.9–10.3)
CHLORIDE: 104 mmol/L (ref 101–111)
CO2: 25 mmol/L (ref 22–32)
CREATININE: 1.14 mg/dL (ref 0.61–1.24)
GFR calc non Af Amer: >60 mL/min (ref >60)
Glucose, Bld: 171 mg/dL — ABNORMAL HIGH (ref 65–99)
Potassium: 4.5 mmol/L (ref 3.5–5.1)
SODIUM: 134 mmol/L — AB (ref 135–145)

## 2014-12-15 LAB — PROTIME-INR
INR: 1.35 (ref 0.00–1.49)
PROTHROMBIN TIME: 16.8 s — AB (ref 11.6–15.2)

## 2014-12-15 LAB — APTT: APTT: 26 s (ref 24–37)

## 2014-12-15 LAB — ABO/RH: ABO/RH(D): B POS

## 2014-12-15 LAB — PREPARE RBC (CROSSMATCH)

## 2014-12-15 SURGERY — ANEURYSM ABDOMINAL AORTIC REPAIR
Anesthesia: General | Site: Abdomen

## 2014-12-15 MED ORDER — ALBUMIN HUMAN 5 % IV SOLN
INTRAVENOUS | Status: DC | PRN
  Administered 2014-12-15: 10:00:00 via INTRAVENOUS

## 2014-12-15 MED ORDER — MEPERIDINE HCL 25 MG/ML IJ SOLN: 6.2500 mg | INTRAMUSCULAR | Status: DC | PRN

## 2014-12-15 MED ORDER — LACTATED RINGERS IV SOLN
INTRAVENOUS | Status: DC | PRN
  Administered 2014-12-15 (×3): via INTRAVENOUS

## 2014-12-15 MED ORDER — ONDANSETRON HCL 4 MG/2ML IJ SOLN
INTRAMUSCULAR | Status: AC
  Filled 2014-12-15: qty 2

## 2014-12-15 MED ORDER — PROPOFOL 10 MG/ML IV BOLUS
INTRAVENOUS | Status: DC | PRN
Start: 2014-12-15 — End: 2014-12-15
  Administered 2014-12-15 (×2): 50 mg via INTRAVENOUS
  Administered 2014-12-15: 100 mg via INTRAVENOUS

## 2014-12-15 MED ORDER — FENTANYL CITRATE (PF) 250 MCG/5ML IJ SOLN
INTRAMUSCULAR | Status: AC
  Filled 2014-12-15: qty 5

## 2014-12-15 MED ORDER — MIDAZOLAM HCL 2 MG/2ML IJ SOLN
INTRAMUSCULAR | Status: AC
  Filled 2014-12-15: qty 4

## 2014-12-15 MED ORDER — 0.9 % SODIUM CHLORIDE (POUR BTL) OPTIME
TOPICAL | Status: DC | PRN
  Administered 2014-12-15: 3000 mL

## 2014-12-15 MED ORDER — LACTATED RINGERS IV SOLN
INTRAVENOUS | Status: DC | PRN
  Administered 2014-12-15: 07:00:00 via INTRAVENOUS

## 2014-12-15 MED ORDER — DEXTROSE 5 % IV SOLN
1.5000 g | Freq: Two times a day (BID) | INTRAVENOUS | Status: AC
  Administered 2014-12-15 – 2014-12-16 (×2): 1.5 g via INTRAVENOUS
  Filled 2014-12-15 (×2): qty 1.5

## 2014-12-15 MED ORDER — LABETALOL HCL 5 MG/ML IV SOLN
10.0000 mg | INTRAVENOUS | Status: DC | PRN
  Administered 2014-12-20 (×2): 10 mg via INTRAVENOUS
  Filled 2014-12-15 (×2): qty 4

## 2014-12-15 MED ORDER — STERILE WATER FOR INJECTION IJ SOLN
INTRAMUSCULAR | Status: AC
  Filled 2014-12-15: qty 10

## 2014-12-15 MED ORDER — DOCUSATE SODIUM 100 MG PO CAPS
100.0000 mg | ORAL_CAPSULE | Freq: Every day | ORAL | Status: DC
  Administered 2014-12-18 – 2014-12-22 (×4): 100 mg via ORAL
  Filled 2014-12-15 (×5): qty 1

## 2014-12-15 MED ORDER — GLYCOPYRROLATE 0.2 MG/ML IJ SOLN
INTRAMUSCULAR | Status: AC
  Filled 2014-12-15: qty 4

## 2014-12-15 MED ORDER — SODIUM CHLORIDE 0.9 % IV SOLN: INTRAVENOUS | Status: DC

## 2014-12-15 MED ORDER — LIDOCAINE HCL (CARDIAC) 20 MG/ML IV SOLN
INTRAVENOUS | Status: DC | PRN
  Administered 2014-12-15: 50 mg via INTRAVENOUS

## 2014-12-15 MED ORDER — ALUM & MAG HYDROXIDE-SIMETH 200-200-20 MG/5ML PO SUSP
15.0000 mL | ORAL | Status: DC | PRN
  Filled 2014-12-15: qty 30

## 2014-12-15 MED ORDER — ONDANSETRON HCL 4 MG/2ML IJ SOLN
INTRAMUSCULAR | Status: DC | PRN
  Administered 2014-12-15: 4 mg via INTRAVENOUS

## 2014-12-15 MED ORDER — PROTAMINE SULFATE 10 MG/ML IV SOLN
INTRAVENOUS | Status: DC | PRN
  Administered 2014-12-15 (×5): 10 mg via INTRAVENOUS
  Administered 2014-12-15: 20 mg via INTRAVENOUS
  Administered 2014-12-15: 10 mg via INTRAVENOUS

## 2014-12-15 MED ORDER — LABETALOL HCL 5 MG/ML IV SOLN
INTRAVENOUS | Status: DC | PRN
  Administered 2014-12-15: 5 mg via INTRAVENOUS
  Administered 2014-12-15: 10 mg via INTRAVENOUS
  Administered 2014-12-15: 5 mg via INTRAVENOUS
  Administered 2014-12-15: 2.5 mg via INTRAVENOUS

## 2014-12-15 MED ORDER — NEOSTIGMINE METHYLSULFATE 10 MG/10ML IV SOLN
INTRAVENOUS | Status: AC
  Filled 2014-12-15: qty 1

## 2014-12-15 MED ORDER — CHLORHEXIDINE GLUCONATE CLOTH 2 % EX PADS: 6.0000 | MEDICATED_PAD | Freq: Once | CUTANEOUS | Status: DC

## 2014-12-15 MED ORDER — PROMETHAZINE HCL 25 MG/ML IJ SOLN: 6.2500 mg | INTRAMUSCULAR | Status: DC | PRN

## 2014-12-15 MED ORDER — NITROGLYCERIN 0.2 MG/ML ON CALL CATH LAB
INTRAVENOUS | Status: DC | PRN
  Administered 2014-12-15 (×3): 40 ug via INTRAVENOUS
  Administered 2014-12-15 (×3): 80 ug via INTRAVENOUS

## 2014-12-15 MED ORDER — ACETAMINOPHEN 650 MG RE SUPP: 325.0000 mg | RECTAL | Status: DC | PRN

## 2014-12-15 MED ORDER — HEPARIN SODIUM (PORCINE) 1000 UNIT/ML IJ SOLN
INTRAMUSCULAR | Status: AC
  Filled 2014-12-15: qty 1

## 2014-12-15 MED ORDER — MAGNESIUM SULFATE 2 GM/50ML IV SOLN
2.0000 g | Freq: Every day | INTRAVENOUS | Status: AC | PRN
  Administered 2014-12-15: 2 g via INTRAVENOUS
  Filled 2014-12-15: qty 50

## 2014-12-15 MED ORDER — SODIUM CHLORIDE 0.9 % IV SOLN: Freq: Once | INTRAVENOUS | Status: DC

## 2014-12-15 MED ORDER — ACETAMINOPHEN 325 MG PO TABS
325.0000 mg | ORAL_TABLET | ORAL | Status: DC | PRN
  Filled 2014-12-15: qty 2

## 2014-12-15 MED ORDER — PANTOPRAZOLE SODIUM 40 MG PO TBEC
40.0000 mg | DELAYED_RELEASE_TABLET | Freq: Every day | ORAL | Status: DC
  Filled 2014-12-15: qty 1

## 2014-12-15 MED ORDER — MORPHINE SULFATE (PF) 2 MG/ML IV SOLN
2.0000 mg | INTRAVENOUS | Status: DC | PRN
  Administered 2014-12-15 (×2): 4 mg via INTRAVENOUS
  Administered 2014-12-15: 5 mg via INTRAVENOUS
  Administered 2014-12-15 (×2): 4 mg via INTRAVENOUS
  Administered 2014-12-15: 2 mg via INTRAVENOUS
  Administered 2014-12-16 (×4): 4 mg via INTRAVENOUS
  Filled 2014-12-15 (×2): qty 2
  Filled 2014-12-15: qty 1
  Filled 2014-12-15 (×4): qty 2
  Filled 2014-12-15: qty 3
  Filled 2014-12-15 (×2): qty 2

## 2014-12-15 MED ORDER — HYDROMORPHONE HCL 1 MG/ML IJ SOLN
0.2500 mg | INTRAMUSCULAR | Status: DC | PRN
  Administered 2014-12-15 (×2): 0.5 mg via INTRAVENOUS

## 2014-12-15 MED ORDER — MIDAZOLAM HCL 5 MG/5ML IJ SOLN
INTRAMUSCULAR | Status: DC | PRN
  Administered 2014-12-15 (×2): 1 mg via INTRAVENOUS

## 2014-12-15 MED ORDER — SODIUM CHLORIDE 0.9 % IV SOLN
INTRAVENOUS | Status: DC | PRN
  Administered 2014-12-15: 12:00:00 via INTRAVENOUS

## 2014-12-15 MED ORDER — ROCURONIUM BROMIDE 50 MG/5ML IV SOLN
INTRAVENOUS | Status: AC
  Filled 2014-12-15: qty 1

## 2014-12-15 MED ORDER — POTASSIUM CHLORIDE CRYS ER 20 MEQ PO TBCR
20.0000 meq | EXTENDED_RELEASE_TABLET | Freq: Every day | ORAL | Status: DC | PRN
Start: 2014-12-15 — End: 2014-12-22

## 2014-12-15 MED ORDER — GLYCOPYRROLATE 0.2 MG/ML IJ SOLN
INTRAMUSCULAR | Status: DC | PRN
  Administered 2014-12-15: .8 mg via INTRAVENOUS

## 2014-12-15 MED ORDER — ARTIFICIAL TEARS OP OINT
TOPICAL_OINTMENT | OPHTHALMIC | Status: AC
  Filled 2014-12-15: qty 3.5

## 2014-12-15 MED ORDER — HEPARIN SODIUM (PORCINE) 1000 UNIT/ML IJ SOLN
INTRAMUSCULAR | Status: DC | PRN
  Administered 2014-12-15: 8000 [IU] via INTRAVENOUS

## 2014-12-15 MED ORDER — BISACODYL 10 MG RE SUPP: 10.0000 mg | Freq: Every day | RECTAL | Status: DC | PRN

## 2014-12-15 MED ORDER — SODIUM CHLORIDE 0.9 % IV SOLN
INTRAVENOUS | Status: DC | PRN
  Administered 2014-12-15: 500 mL

## 2014-12-15 MED ORDER — SODIUM CHLORIDE 0.9 % IV SOLN: 500.0000 mL | Freq: Once | INTRAVENOUS | Status: DC | PRN

## 2014-12-15 MED ORDER — KCL IN DEXTROSE-NACL 20-5-0.45 MEQ/L-%-% IV SOLN
INTRAVENOUS | Status: DC
  Administered 2014-12-15 – 2014-12-16 (×2): 125 mL via INTRAVENOUS
  Administered 2014-12-16 – 2014-12-17 (×3): via INTRAVENOUS
  Filled 2014-12-15 (×7): qty 1000

## 2014-12-15 MED ORDER — PROTAMINE SULFATE 10 MG/ML IV SOLN
INTRAVENOUS | Status: AC
  Filled 2014-12-15: qty 10

## 2014-12-15 MED ORDER — SUCCINYLCHOLINE CHLORIDE 20 MG/ML IJ SOLN
INTRAMUSCULAR | Status: AC
  Filled 2014-12-15: qty 1

## 2014-12-15 MED ORDER — DEXTROSE 5 % IV SOLN
750.0000 mg | INTRAVENOUS | Status: DC
  Filled 2014-12-15: qty 750

## 2014-12-15 MED ORDER — ENOXAPARIN SODIUM 40 MG/0.4ML ~~LOC~~ SOLN
40.0000 mg | SUBCUTANEOUS | Status: DC
  Administered 2014-12-16 – 2014-12-18 (×3): 40 mg via SUBCUTANEOUS
  Filled 2014-12-15 (×5): qty 0.4

## 2014-12-15 MED ORDER — PHENYLEPHRINE HCL 10 MG/ML IJ SOLN
10.0000 mg | INTRAVENOUS | Status: DC | PRN
  Administered 2014-12-15: 40 ug/min via INTRAVENOUS

## 2014-12-15 MED ORDER — MANNITOL 25 % IV SOLN
INTRAVENOUS | Status: DC | PRN
  Administered 2014-12-15: 12.5 g via INTRAVENOUS

## 2014-12-15 MED ORDER — HYDRALAZINE HCL 20 MG/ML IJ SOLN
5.0000 mg | INTRAMUSCULAR | Status: AC | PRN
  Administered 2014-12-15 – 2014-12-16 (×2): 5 mg via INTRAVENOUS
  Filled 2014-12-15 (×2): qty 1

## 2014-12-15 MED ORDER — EPHEDRINE SULFATE 50 MG/ML IJ SOLN
INTRAMUSCULAR | Status: AC
  Filled 2014-12-15: qty 1

## 2014-12-15 MED ORDER — PHENOL 1.4 % MT LIQD: 1.0000 | OROMUCOSAL | Status: DC | PRN

## 2014-12-15 MED ORDER — VECURONIUM BROMIDE 10 MG IV SOLR
INTRAVENOUS | Status: DC | PRN
  Administered 2014-12-15 (×2): 2 mg via INTRAVENOUS
  Administered 2014-12-15: 1 mg via INTRAVENOUS
  Administered 2014-12-15: 2 mg via INTRAVENOUS

## 2014-12-15 MED ORDER — NEOSTIGMINE METHYLSULFATE 10 MG/10ML IV SOLN
INTRAVENOUS | Status: DC | PRN
  Administered 2014-12-15: 5 mg via INTRAVENOUS

## 2014-12-15 MED ORDER — GUAIFENESIN-DM 100-10 MG/5ML PO SYRP: 15.0000 mL | ORAL_SOLUTION | ORAL | Status: DC | PRN

## 2014-12-15 MED ORDER — ROCURONIUM BROMIDE 100 MG/10ML IV SOLN
INTRAVENOUS | Status: DC | PRN
  Administered 2014-12-15: 50 mg via INTRAVENOUS

## 2014-12-15 MED ORDER — HYDROMORPHONE HCL 1 MG/ML IJ SOLN
INTRAMUSCULAR | Status: AC
  Filled 2014-12-15: qty 1

## 2014-12-15 MED ORDER — PROPOFOL 10 MG/ML IV BOLUS
INTRAVENOUS | Status: AC
  Filled 2014-12-15: qty 20

## 2014-12-15 MED ORDER — ONDANSETRON HCL 4 MG/2ML IJ SOLN
4.0000 mg | Freq: Four times a day (QID) | INTRAMUSCULAR | Status: DC | PRN
  Filled 2014-12-15 (×2): qty 2

## 2014-12-15 MED ORDER — METOPROLOL TARTRATE 1 MG/ML IV SOLN
2.0000 mg | INTRAVENOUS | Status: AC | PRN
  Administered 2014-12-16 (×2): 5 mg via INTRAVENOUS
  Filled 2014-12-15 (×2): qty 5

## 2014-12-15 MED ORDER — VECURONIUM BROMIDE 10 MG IV SOLR
INTRAVENOUS | Status: AC
  Filled 2014-12-15: qty 10

## 2014-12-15 MED ORDER — THROMBIN 20000 UNITS EX SOLR
CUTANEOUS | Status: AC
  Filled 2014-12-15: qty 20000

## 2014-12-15 MED ORDER — FENTANYL CITRATE (PF) 100 MCG/2ML IJ SOLN
INTRAMUSCULAR | Status: DC | PRN
Start: 2014-12-15 — End: 2014-12-15
  Administered 2014-12-15 (×5): 50 ug via INTRAVENOUS
  Administered 2014-12-15: 100 ug via INTRAVENOUS
  Administered 2014-12-15: 50 ug via INTRAVENOUS

## 2014-12-15 MED ORDER — LACTATED RINGERS IV SOLN
INTRAVENOUS | Status: DC | PRN
  Administered 2014-12-15 (×2): via INTRAVENOUS

## 2014-12-15 MED ORDER — LIDOCAINE HCL (CARDIAC) 20 MG/ML IV SOLN
INTRAVENOUS | Status: AC
  Filled 2014-12-15: qty 5

## 2014-12-15 MED ORDER — ARTIFICIAL TEARS OP OINT
TOPICAL_OINTMENT | OPHTHALMIC | Status: DC | PRN
  Administered 2014-12-15: 1 via OPHTHALMIC

## 2014-12-15 SURGICAL SUPPLY — 53 items
ATTRACTOMAT 16X20 MAGNETIC DRP (DRAPES) IMPLANT
CANISTER SUCTION 2500CC (MISCELLANEOUS) ×2 IMPLANT
CANNULA VESSEL 3MM 2 BLNT TIP (CANNULA) ×4 IMPLANT
CLIP TI MEDIUM 24 (CLIP) ×2 IMPLANT
CLIP TI WIDE RED SMALL 24 (CLIP) ×2 IMPLANT
DRSG COVADERM 4X14 (GAUZE/BANDAGES/DRESSINGS) ×2 IMPLANT
DRSG COVADERM 4X6 (GAUZE/BANDAGES/DRESSINGS) ×2 IMPLANT
ELECT BLADE 6.5 EXT (BLADE) IMPLANT
ELECT REM PT RETURN 9FT ADLT (ELECTROSURGICAL) ×2
ELECTRODE REM PT RTRN 9FT ADLT (ELECTROSURGICAL) ×1 IMPLANT
GLOVE BIO SURGEON STRL SZ 6 (GLOVE) ×2 IMPLANT
GLOVE BIO SURGEON STRL SZ7 (GLOVE) ×2 IMPLANT
GLOVE BIO SURGEON STRL SZ7.5 (GLOVE) ×2 IMPLANT
GLOVE BIOGEL PI IND STRL 6.5 (GLOVE) ×3 IMPLANT
GLOVE BIOGEL PI IND STRL 7.5 (GLOVE) ×3 IMPLANT
GLOVE BIOGEL PI INDICATOR 6.5 (GLOVE) ×3
GLOVE BIOGEL PI INDICATOR 7.5 (GLOVE) ×3
GLOVE ECLIPSE 7.0 STRL STRAW (GLOVE) ×4 IMPLANT
GLOVE ECLIPSE 7.5 STRL STRAW (GLOVE) ×2 IMPLANT
GLOVE SS BIOGEL STRL SZ 7.5 (GLOVE) ×1 IMPLANT
GLOVE SUPERSENSE BIOGEL SZ 7.5 (GLOVE) ×1
GLOVE SURG SS PI 7.0 STRL IVOR (GLOVE) ×4 IMPLANT
GOWN STRL REUS W/ TWL LRG LVL3 (GOWN DISPOSABLE) ×6 IMPLANT
GOWN STRL REUS W/ TWL XL LVL3 (GOWN DISPOSABLE) ×3 IMPLANT
GOWN STRL REUS W/TWL LRG LVL3 (GOWN DISPOSABLE) ×6
GOWN STRL REUS W/TWL XL LVL3 (GOWN DISPOSABLE) ×3
GRAFT HEMASHIELD 18X9MM (Vascular Products) ×2 IMPLANT
INSERT FOGARTY 61MM (MISCELLANEOUS) ×2 IMPLANT
INSERT FOGARTY SM (MISCELLANEOUS) ×2 IMPLANT
KIT BASIN OR (CUSTOM PROCEDURE TRAY) ×2 IMPLANT
KIT ROOM TURNOVER OR (KITS) ×2 IMPLANT
LOOP VESSEL MINI RED (MISCELLANEOUS) IMPLANT
NS IRRIG 1000ML POUR BTL (IV SOLUTION) ×6 IMPLANT
PACK AORTA (CUSTOM PROCEDURE TRAY) ×2 IMPLANT
PAD ARMBOARD 7.5X6 YLW CONV (MISCELLANEOUS) ×4 IMPLANT
RETAINER VISCERA MED (MISCELLANEOUS) ×2 IMPLANT
SPONGE LAP 18X18 X RAY DECT (DISPOSABLE) IMPLANT
SPONGE SURGIFOAM ABS GEL 100 (HEMOSTASIS) IMPLANT
STAPLER VISISTAT 35W (STAPLE) ×4 IMPLANT
SUT PDS AB 1 TP1 54 (SUTURE) ×6 IMPLANT
SUT PROLENE 3 0 SH DA (SUTURE) ×4 IMPLANT
SUT PROLENE 3 0 SH1 36 (SUTURE) ×4 IMPLANT
SUT PROLENE 5 0 C 1 24 (SUTURE) ×8 IMPLANT
SUT PROLENE 5 0 C 1 36 (SUTURE) ×2 IMPLANT
SUT PROLENE 5 0 CC 1 (SUTURE) ×4 IMPLANT
SUT PROLENE 6 0 C 1 30 (SUTURE) ×2 IMPLANT
SUT SILK 2 0SH CR/8 30 (SUTURE) ×2 IMPLANT
SUT VIC AB 2-0 CT1 36 (SUTURE) IMPLANT
SUT VIC AB 3-0 SH 27 (SUTURE)
SUT VIC AB 3-0 SH 27X BRD (SUTURE) IMPLANT
TOWEL BLUE STERILE X RAY DET (MISCELLANEOUS) ×4 IMPLANT
TRAY FOLEY W/METER SILVER 16FR (SET/KITS/TRAYS/PACK) ×2 IMPLANT
WATER STERILE IRR 1000ML POUR (IV SOLUTION) ×2 IMPLANT

## 2014-12-15 NOTE — Op Note (Signed)
Procedure: Repair of abdominal aortic aneurysm  Preoperative diagnosis: Abdominal aortic aneurysm  Postoperative diagnosis: Same  Anesthesia: Gen.  Assistant: Curt Jews MD, Adele Barthel MD, Gerri Lins PA-C   Operative findings: 18 mm Dacron graft from just below take off of renal arteries to the aortic bifurcation   Operative details: After obtaining informed consent, the patient was taken to the operating room. The patient was placed in supine position on the operating room table. After induction of general anesthesia and endotracheal intubation, a Foley catheter and nasogastric tube was placed. Next the patient was prepped and draped in usual sterile fashion from the nipples to the knees. A midline laparotomy incision was made extending from the xiphoid down to the pubis. Incision was carried into the subcutaneous tissues and through the fascia and peritoneum.  The transverse colon was mobilized superiorly the small bowel and duodenum were reflected to the right. The retroperitoneum was entered. The Omni retractor was used to assist in retraction. Dissection was carried up to the level of the left renal vein. To facilitate exposure of the aortic neck the left renal vein was divided between Henley clamps. This was divided just proximal to the take off of the gonadal and adrenal branches. This was done with a running 5-0 Prolene suture on the proximal and distal end of the vein. After the left renal vein was identified a suitable area for clamping on the suprarenal portion of the aorta was dissected free. The left renal and right renal arteries were identified and dissected free circumferentially.  Vessel loops were placed around these. Dissection was then carried down to level of the inferior mesenteric artery. This was dissected free circumferentially and a vessel loop placed around it. Dissection was then carried down to the aortic bifurcation. The left and right common iliac arteries were dissected  free circumferentially and a vessel loop placed around these. The patient was given 8000 units of intravenous heparin.  The left and right common iliac arteries were controlled with angled coarct clamps. The vessel loops were pulled taut on the renal arteries to protect them from aortic thrombus.  An aortic DeBakey clamp was used to control the aorta just above the level of the renal arteries. The aneurysm was entered with cautery. A large amount of thrombus was removed. There was a large amount of laminated thrombus material and as much as possible was evacuated and thoroughly irrigated with normal saline solution. Several lumbar arteries were ligated with a figure-of-eight suture. The proximal neck of aorta was fashioned and an 18 mm Dacron graft properly operative field and sewn end-to-end to this using a running 3-0 Prolene suture with a circumferential felt strip. At completion of the anastomosis this was tested and found to be hemostatic. The graft was then flushed and then flow opened back to the renal arteries.  The aortic clamp was then moved down below the level anastomosis infrarenal. The graft was then cut for length to the distal anastomosis. The distal aorta was fashioned and prepared for anastomosis. Graft was then sent end-to-end to the aortic bifurcation using a running 3-0 Prolene suture.  This incorporated and preserved the inferior mesenteric artery at the distal anastomosis. Just prior to completion of the anastomosis everything was thoroughly forebled backbled and thoroughly flushed from the iliacs and aorta. The anastomosis was secured clamps released there is initial pressure drop on unclamping of the right common iliac artery and this quickly recovered. On unclamping the left common iliac artery was also brief pressure  drop and this quickly recovered.  Heparin was reversed with protamine. Hemostasis was obtained. The aortic wall was reapproximated using a running 3-0 prolene suture. The  retroperitoneum was closed with a running vicryl stitch so there was adequate coverage of the suture lines using again a running 3-0 Vicryl suture. The viscera return to their normal position the Omni retractor was removed. The small bowel transverse and sigmoid colon were all viable. There was doppler flow in the feet the renal arteries and the inferior mesenteric artery.  Next, the fascia was reapproximated with a running #1 PDS suture. The skin was then closed with staples. Instrument sponge and needle counts were correct at the end of the case. The patient was taken to the PACU in stable condition.   Ruta Hinds, MD  [facility] [place]  Office: [phone]  Pager: [phone]

## 2014-12-15 NOTE — Progress Notes (Signed)
       Patient alert Feet warm palpable right DP and left PT BP stable Lungs non labored breathing  S/P AAA open repair Pending bed on 2S  Taralyn Ferraiolo MAUREEN PA-C

## 2014-12-15 NOTE — Progress Notes (Signed)
In PACU BP stable Feet pink warm PT doppler Good urine output To ICU when bed available  Stable post op  Ruta Hinds, MD [facility] [place] Office: [phone] Pager: [phone]

## 2014-12-15 NOTE — Progress Notes (Signed)
Pharmacy Consult: Antibiotics renal dose adjustment  5 YOM s/p AAA open repair this afternoon, getting zinacef for post-op prophylaxis. Pharmacy is consulted for antibiotics renal dose adjustment. Scr 1.14, est. crcl 60 ml/min. He received pre-op dose at 0830.  Plan: Zinacef 1.5 g IV Q 12 hrs x 2 doses as ordered, no adjustment needed. Pharmacy sign off.   Thanks.  Maryanna Shape, PharmD, BCPS  Clinical Pharmacist  Pager: [phone]

## 2014-12-15 NOTE — H&P (View-Only) (Signed)
[facility] [place] HISTORY AND PHYSICAL    History of Present Illness:  Patient is a 74 year old male who presents for follow-up evaluation of abdominal aortic aneurysm. The patient has a known aneurysm that has been followed for several years. It was last measured in September 2014 at 3.9 cm. The patient denies any back or abdominal pain. He did have a severe GI bleed earlier this year which required 16 units of blood. He has not had any bleeding since then. He is currently on aspirin 81 mg once daily. He is tolerating this. He does smoke and currently does not have any intentions to quit.  He had right-sided chest pain which lasted approximately 10 minutes last week. He stated the pain level was a 10 out of 10. This resolved without intervention. He does not recall if this was similar to his prior angina. He denies any chest pain currently. He has no shortness of breath currently. He states he did have an episode of increased blood pressure during the episode of chest pain.  Other medical problems include atrial tachycardia, coronary artery disease, hypertension, history of prostate cancer all of which are currently stable. He returns today for follow-up after recent CT angiogram of the abdomen and pelvis.    Past Medical History     Diagnosis    Date     .    Atrial tachycardia         .    CAD (coronary artery disease)                 S/P PCI LAD     .    Hypertension         .    Personal history of unspecified circulatory disease                 Transient Ischemic attacks     .    Prostate cancer         .    Tobacco abuse         .    AAA (abdominal aortic aneurysm)    11/04/09             3.6 cmx 3.4 cm         Past Surgical History     Procedure    Laterality    Date     .    Bare-metal stent                     Left circumflex about 10 years ago     .    Pci of the lad             .    Prostate surgery               Social History History     Substance Use  Topics     .    Smoking status:    Current Every Day Smoker -- 1.00 packs/day for 80 years     .    Smokeless tobacco:    Not on file     .    Alcohol Use:    Yes                 Comment: Drinks several shots most nights       Family History Family History     Problem    Relation    Age of Onset     .  Heart attack    Mother    46     .    Coronary artery disease    Brother         .    Arrhythmia    Brother                 S/P cardioversion       Allergies    Allergies     Allergen    Reactions     .    Celecoxib              Current Outpatient Prescriptions on File Prior to Visit   Medication  Sig  Dispense  Refill   .  aspirin EC 81 MG tablet  Take 1 tablet (81 mg total) by mouth daily.  90 tablet  3   .  cloNIDine (CATAPRES) 0.2 MG tablet  TAKE ONE TABLET BY MOUTH TWICE DAILY  180 tablet  2   .  lisinopril (PRINIVIL,ZESTRIL) 20 MG tablet  Take 1 tablet (20 mg total) by mouth 2 (two) times daily.  180 tablet  3   .  metoprolol tartrate (LOPRESSOR) 25 MG tablet  TAKE ONE TABLET BY MOUTH THREE TIMES DAILY (GENERIC FOR LOPRESSOR)  270 tablet  3   .  MULTAQ 400 MG tablet  TAKE ONE TABLET BY MOUTH TWICE DAILY WITH MEALS  60 tablet  10   .  nitroGLYCERIN (NITROSTAT) 0.4 MG SL tablet  Place 1 tablet (0.4 mg total) under the tongue every 5 (five) minutes as needed for chest pain.  25 tablet  12   .  pantoprazole (PROTONIX) 40 MG tablet  Take 1 tablet (40 mg total) by mouth 2 (two) times daily.  60 tablet  5      No current facility-administered medications on file prior to visit.     ROS:    General:  No weight loss, Fever, chills  Cardiac: No recent episodes of chest pain/pressure, no shortness of breath at rest.  +shortness of breath with exertion.     Vascular: No history of rest pain in feet.  No history of claudication.  No history of non-healing ulcer, No history of DVT    Pulmonary: No home oxygen, no productive cough, no hemoptysis,  No asthma or  wheezing  Hematologic: Complains of easy bruising  GI: No hematochezia and no melanoma    Physical Examination       Filed Vitals:   11/27/14 1244 11/27/14 1246  BP: 166/82 154/90  Pulse: 61 61  Temp: 97.9 F (36.6 C)   Resp: 18   Height: 5\' 10"  (1.778 m)   Weight: 168 lb (76.204 kg)   SpO2: 99%    General:  Alert and oriented, no acute distress HEENT: Normal Neck: No bruit or JVD Pulmonary: Clear to auscultation bilaterally Cardiac: Regular Rate and Rhythm without murmur Abdomen: Soft, non-tender, non-distended, no mass Skin: No rash Extremity Pulses:  2+ radial, brachial, femoral, 2+ right dorsalis pedis, 2+ left posterior tibial pulse Musculoskeletal: No deformity or edema     Neurologic: Upper and lower extremity motor 5/5 and symmetric  DATA:  CT Angio is reviewed today. Aortic diameter is 5.1 cm. There is no evidence of rupture. Left common iliac artery slightly ectatic. 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 Celiac SMA and renal arteries are widely patent. There is a large rind of thrombus within the infrarenal aorta. This extends up to the level of the  left renal artery and encroaches upon it.   ASSESSMENT: 5.1 cm abdominal aortic aneurysm asymptomatic. 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   PLAN: The patient is not a candidate for aneurysm stent graft repair due to his left common femoral artery aneurysm as well as the large amount of thrombus load within the aneurysm which would probably prevent [place] from establishing a good proximal seal as well as potentially embolizing the left renal artery. I offered the patient today the options of continued observation with a repeat ultrasound in 6 months time and consider repair if the aneurysm got to 5-1/2 cm in diameter. I also offered him open repair at this point which would require a suprarenal clamp to protect the left renal artery at least initially until the proximal anastomosis was completed. Most  likely this would be an aortobiiliac graft and then also in the same setting do a left groin incision and replace the left common femoral artery with an interposition graft to repair the left common femoral artery aneurysm.  Patient to see Dr. Rayann Heman in the near future for recent episode of chest pain in light of history of prior coronary stenting and possibility of abdominal aortic aneurysm repair in the near future.  The patient will call me if he wishes to consider open repair at this point. Otherwise I will see him again in 6 months time.  Ruta Hinds, MD [facility] [place] Office: [phone] Pager: [phone]

## 2014-12-15 NOTE — Transfer of Care (Signed)
Immediate Anesthesia Transfer of Care Note  Patient: Jerry Gilbert  Procedure(s) Performed: Procedure(s): ABDOMINAL AORTIC ANEURYSM REPAIR (N/A)  Patient Location: PACU  Anesthesia Type:General  Level of Consciousness: awake, alert  and oriented  Airway & Oxygen Therapy: Patient Spontanous Breathing and Patient connected to nasal cannula oxygen  Post-op Assessment: Report given to RN, Post -op Vital signs reviewed and stable and Patient moving all extremities X 4  Post vital signs: Reviewed and stable  Last Vitals:  Filed Vitals:   12/15/14 0606  BP: 163/99  Pulse: 64  Temp: 37 C  Resp: 18    Complications: No apparent anesthesia complications

## 2014-12-15 NOTE — Anesthesia Procedure Notes (Signed)
Procedure Name: Intubation Date/Time: 12/15/2014 8:07 AM Performed by: Garrison Columbus T Pre-anesthesia Checklist: Patient identified, Emergency Drugs available, Suction available and Patient being monitored Patient Re-evaluated:Patient Re-evaluated prior to inductionOxygen Delivery Method: Circle system utilized Preoxygenation: Pre-oxygenation with 100% oxygen Intubation Type: IV induction Ventilation: Mask ventilation without difficulty and Oral airway inserted - appropriate to patient size Laryngoscope Size: Sabra Heck and 2 Grade View: Grade I Tube type: Subglottic suction tube Tube size: 8.5 mm Number of attempts: 1 Airway Equipment and Method: Stylet and Oral airway Placement Confirmation: ETT inserted through vocal cords under direct vision,  positive ETCO2 and breath sounds checked- equal and bilateral Secured at: 23 cm Tube secured with: Tape Dental Injury: Teeth and Oropharynx as per pre-operative assessment

## 2014-12-15 NOTE — Progress Notes (Signed)
Utilization Review Completed.  

## 2014-12-15 NOTE — Interval H&P Note (Signed)
History and Physical Interval Note:  12/15/2014 7:28 AM  Jerry Gilbert  has presented today for surgery, with the diagnosis of Abdominal aortic aneurysm I71.4  The various methods of treatment have been discussed with the patient and family. After consideration of risks, benefits and other options for treatment, the patient has consented to  Procedure(s): ANEURYSM ABDOMINAL AORTIC REPAIR (N/A) as a surgical intervention .  The patient's history has been reviewed, patient examined, no change in status, stable for surgery.  I have reviewed the patient's chart and labs.  Questions were answered to the patient's satisfaction.     Ruta Hinds

## 2014-12-16 ENCOUNTER — Inpatient Hospital Stay (HOSPITAL_COMMUNITY): Payer: Medicare Other

## 2014-12-16 ENCOUNTER — Encounter (HOSPITAL_COMMUNITY): Payer: Self-pay | Admitting: Vascular Surgery

## 2014-12-16 LAB — COMPREHENSIVE METABOLIC PANEL
ALT: 17 U/L (ref 17–63)
ANION GAP: 5 (ref 5–15)
AST: 17 U/L (ref 15–41)
Albumin: 2.7 g/dL — ABNORMAL LOW (ref 3.5–5.0)
Alkaline Phosphatase: 75 U/L (ref 38–126)
BILIRUBIN TOTAL: 0.9 mg/dL (ref 0.3–1.2)
BUN: 10 mg/dL (ref 6–20)
CHLORIDE: 102 mmol/L (ref 101–111)
CO2: 25 mmol/L (ref 22–32)
Calcium: 7.5 mg/dL — ABNORMAL LOW (ref 8.9–10.3)
Creatinine, Ser: 0.87 mg/dL (ref 0.61–1.24)
Glucose, Bld: 144 mg/dL — ABNORMAL HIGH (ref 65–99)
POTASSIUM: 4 mmol/L (ref 3.5–5.1)
Sodium: 132 mmol/L — ABNORMAL LOW (ref 135–145)
TOTAL PROTEIN: 5.3 g/dL — AB (ref 6.5–8.1)

## 2014-12-16 LAB — CBC
HCT: 36.8 % — ABNORMAL LOW (ref 39.0–52.0)
HEMATOCRIT: 35.5 % — AB (ref 39.0–52.0)
Hemoglobin: 12.3 g/dL — ABNORMAL LOW (ref 13.0–17.0)
Hemoglobin: 11.4 g/dL — ABNORMAL LOW (ref 13.0–17.0)
MCH: 28.5 pg (ref 26.0–34.0)
MCH: 27.4 pg (ref 26.0–34.0)
MCHC: 33.4 g/dL (ref 30.0–36.0)
MCHC: 32.1 g/dL (ref 30.0–36.0)
MCV: 85.2 fL (ref 78.0–100.0)
MCV: 85.3 fL (ref 78.0–100.0)
PLATELETS: 144 10*3/uL — AB (ref 150–400)
PLATELETS: 142 10*3/uL — AB (ref 150–400)
RBC: 4.32 MIL/uL (ref 4.22–5.81)
RBC: 4.16 MIL/uL — AB (ref 4.22–5.81)
RDW: 16.5 % — AB (ref 11.5–15.5)
RDW: 16.7 % — ABNORMAL HIGH (ref 11.5–15.5)
WBC: 14.3 10*3/uL — AB (ref 4.0–10.5)
WBC: 16.7 10*3/uL — AB (ref 4.0–10.5)

## 2014-12-16 LAB — POCT I-STAT 7, (LYTES, BLD GAS, ICA,H+H)
Acid-Base Excess: 3 mmol/L — ABNORMAL HIGH (ref 0.0–2.0)
BICARBONATE: 28.1 meq/L — AB (ref 20.0–24.0)
BICARBONATE: 24.7 meq/L — AB (ref 20.0–24.0)
CALCIUM ION: 1.15 mmol/L (ref 1.13–1.30)
Calcium, Ion: 1.07 mmol/L — ABNORMAL LOW (ref 1.13–1.30)
HCT: 33 % — ABNORMAL LOW (ref 39.0–52.0)
HEMATOCRIT: 35 % — AB (ref 39.0–52.0)
HEMOGLOBIN: 11.2 g/dL — AB (ref 13.0–17.0)
Hemoglobin: 11.9 g/dL — ABNORMAL LOW (ref 13.0–17.0)
O2 SAT: 100 %
O2 SAT: 99 %
PCO2 ART: 40 mmHg (ref 35.0–45.0)
PH ART: 7.45 (ref 7.350–7.450)
PO2 ART: 156 mmHg — AB (ref 80.0–100.0)
POTASSIUM: 4 mmol/L (ref 3.5–5.1)
Patient temperature: 35.6
Patient temperature: 36.9
Potassium: 4.6 mmol/L (ref 3.5–5.1)
SODIUM: 135 mmol/L (ref 135–145)
SODIUM: 134 mmol/L — AB (ref 135–145)
TCO2: 29 mmol/L (ref 0–100)
TCO2: 26 mmol/L (ref 0–100)
pCO2 arterial: 39.9 mmHg (ref 35.0–45.0)
pH, Arterial: 7.398 (ref 7.350–7.450)
pO2, Arterial: 431 mmHg — ABNORMAL HIGH (ref 80.0–100.0)

## 2014-12-16 LAB — TYPE AND SCREEN
ABO/RH(D): B POS
Antibody Screen: NEGATIVE
UNIT DIVISION: 0
UNIT DIVISION: 0
Unit division: 0
Unit division: 0
Unit division: 0
Unit division: 0

## 2014-12-16 LAB — BASIC METABOLIC PANEL
ANION GAP: 7 (ref 5–15)
BUN: 7 mg/dL (ref 6–20)
CALCIUM: 7.8 mg/dL — AB (ref 8.9–10.3)
CO2: 25 mmol/L (ref 22–32)
Chloride: 97 mmol/L — ABNORMAL LOW (ref 101–111)
Creatinine, Ser: 0.88 mg/dL (ref 0.61–1.24)
GFR calc Af Amer: >60 mL/min (ref >60)
GFR calc non Af Amer: >60 mL/min (ref >60)
GLUCOSE: 135 mg/dL — AB (ref 65–99)
POTASSIUM: 4.2 mmol/L (ref 3.5–5.1)
Sodium: 129 mmol/L — ABNORMAL LOW (ref 135–145)

## 2014-12-16 LAB — MAGNESIUM
MAGNESIUM: 2 mg/dL (ref 1.7–2.4)
Magnesium: 2 mg/dL (ref 1.7–2.4)

## 2014-12-16 LAB — AMYLASE: AMYLASE: 58 U/L (ref 28–100)

## 2014-12-16 LAB — TROPONIN I: Troponin I: 0.16 ng/mL — ABNORMAL HIGH (ref <0.031)

## 2014-12-16 MED ORDER — NALOXONE HCL 0.4 MG/ML IJ SOLN: 0.4000 mg | INTRAMUSCULAR | Status: DC | PRN

## 2014-12-16 MED ORDER — MORPHINE SULFATE 1 MG/ML IV SOLN
INTRAVENOUS | Status: DC
  Administered 2014-12-16: 09:00:00 via INTRAVENOUS
  Administered 2014-12-16: 6 mg via INTRAVENOUS
  Administered 2014-12-16: 3 mg via INTRAVENOUS
  Filled 2014-12-16: qty 25

## 2014-12-16 MED ORDER — DIPHENHYDRAMINE HCL 12.5 MG/5ML PO ELIX
12.5000 mg | ORAL_SOLUTION | Freq: Four times a day (QID) | ORAL | Status: DC | PRN
  Filled 2014-12-16: qty 5

## 2014-12-16 MED ORDER — METOPROLOL TARTRATE 1 MG/ML IV SOLN
INTRAVENOUS | Status: AC
  Filled 2014-12-16: qty 5

## 2014-12-16 MED ORDER — ONDANSETRON HCL 4 MG/2ML IJ SOLN
4.0000 mg | Freq: Four times a day (QID) | INTRAMUSCULAR | Status: DC | PRN
  Administered 2014-12-16: 4 mg via INTRAVENOUS

## 2014-12-16 MED ORDER — METOPROLOL TARTRATE 1 MG/ML IV SOLN
5.0000 mg | INTRAVENOUS | Status: DC | PRN
  Administered 2014-12-16 – 2014-12-18 (×2): 5 mg via INTRAVENOUS
  Filled 2014-12-16 (×2): qty 5

## 2014-12-16 MED ORDER — DILTIAZEM HCL 100 MG IV SOLR
5.0000 mg/h | INTRAVENOUS | Status: DC
  Administered 2014-12-16: 5 mg/h via INTRAVENOUS
  Administered 2014-12-17 (×2): 10 mg/h via INTRAVENOUS
  Administered 2014-12-18: 12.5 mg/h via INTRAVENOUS
  Administered 2014-12-18: 10 mg/h via INTRAVENOUS
  Administered 2014-12-18: 12.5 mg/h via INTRAVENOUS
  Filled 2014-12-16 (×5): qty 100

## 2014-12-16 MED ORDER — SODIUM CHLORIDE 0.9 % IJ SOLN: 9.0000 mL | INTRAMUSCULAR | Status: DC | PRN

## 2014-12-16 MED ORDER — DILTIAZEM LOAD VIA INFUSION
10.0000 mg | Freq: Once | INTRAVENOUS | Status: AC
  Administered 2014-12-16: 10 mg via INTRAVENOUS
  Filled 2014-12-16: qty 10

## 2014-12-16 MED ORDER — DIPHENHYDRAMINE HCL 50 MG/ML IJ SOLN
12.5000 mg | Freq: Four times a day (QID) | INTRAMUSCULAR | Status: DC | PRN
  Administered 2014-12-17: 12.5 mg via INTRAVENOUS
  Filled 2014-12-16: qty 1

## 2014-12-16 MED FILL — Sodium Chloride IV Soln 0.9%: INTRAVENOUS | Qty: 2000 | Status: AC

## 2014-12-16 MED FILL — Heparin Sodium (Porcine) Inj 1000 Unit/ML: INTRAMUSCULAR | Qty: 30 | Status: AC

## 2014-12-16 NOTE — Progress Notes (Signed)
Patient heart 100-115 most of the day. Md notified at 1830 pm of needing a prn does of lopressor due to heart rate climbing higher. MD ordered PRN dose of lopressor.   MD then immediately notified around [place] that patient looks to have converted into A-fib with heart rate into the 180's. MD ordered lopressor PRN orders, labs, and EKG. Patient BPis stable and O2 sats are 95 on 4L.   Will continue to monitor. Report given to the night nurse to continue care.   Qais Jowers, Mervin Kung Rn

## 2014-12-16 NOTE — Progress Notes (Signed)
PT Cancellation Note  Patient Details Name: Jerry Gilbert MRN: [id number] DOB: 22-Sep-1940   Cancelled Treatment:    Reason Eval/Treat Not Completed: Patient at procedure or test/unavailable. Patient in process of transferring to new room. Will evaluate later today.   Malaiyah Achorn 12/16/2014, 12:19 PM  Pager [phone]

## 2014-12-16 NOTE — Progress Notes (Signed)
PT Cancellation Note  Patient Details Name: Jerry Gilbert MRN: [id number] DOB: [birth year]   Cancelled Treatment:    Reason Eval/Treat Not Completed: Fatigue/lethargy limiting ability to participate;Medical issues which prohibited therapy  Patient now has [place] suction set up (he refused OT wanting that to be fixed first), however he now refuses stating he has slept more than a wink in 2 days. Explained benefits of activity. Pt was considering trying and began to have a coughing spell with HR up to 132 requiring several minutes to return to 115. Pt refused to attempt OOB at this time.   Teresia Myint 12/16/2014, 3:31 PM Pager [phone]

## 2014-12-16 NOTE — Significant Event (Addendum)
Patient ambulated halfway to new room, ambulated approximately 150 feet, then taken via wheelchair to 2W17. HR was 110-116 during ambulation, on 2L [place]. Patient settled in bed per his requests. NG intact and is placed on intermittent low wall suction; foley catheter intact. Placed on portable monitor. RN called patient's spouse on phone but no answered. Report given to receiving RN Jerene Pitch and staff in room prior to leaving.

## 2014-12-16 NOTE — Progress Notes (Signed)
OT Cancellation Note  Patient Details Name: Jerry Gilbert MRN: [id number] DOB: [birth year]   Cancelled Treatment:    Reason Eval/Treat Not Completed: Other (comment). Pt unhappy about his suction an not willing to get up with therapy at this time. RN aware.  Benito Mccreedy OTR/L [phone] 12/16/2014, 2:29 PM

## 2014-12-16 NOTE — Progress Notes (Signed)
Upon arrival on shift day RN notified MD concerning new rhythm. EKG obtained and afib confirmed in 180s. MD notified and orders received. Complicated family situation as the wife is ICU nurse and demanding specific care for her husband. Dr. Oneida Alar is aware of her concerns and has previously addressed them. Wife is demanding the cardiologist be consulted. RN will convey to MD.   MD notified concerning positive troponin of .16 wife concerns were conveyed. HR is afib at 140s.   Will continue to monitor.  Jerry Gilbert

## 2014-12-16 NOTE — Progress Notes (Signed)
Pt converted to SR. Confirmed with EKG. Pt resting comfortably and states he feels much better. Wife comments that he appears to be resting better than he has this admission.   Will continue to monitor.  Earlie Lou

## 2014-12-16 NOTE — Progress Notes (Signed)
[facility]  Subjective  - feels sore   Objective 141/80 96 98.3 F (36.8 C) (Oral) 16 92%  Intake/Output Summary (Last 24 hours) at 12/16/14 0759 Last data filed at 12/16/14 0700  Gross per 24 hour  Intake   7950 ml  Output   3685 ml  Net   4265 ml   Abdomen soft dressing dry Palpable pedal pulses both feet Adequate urine output Slightly tachycardic most likely secondary to pain  Assessment/Planning: Continue IV F NG D/c aline swan sleeve Keep Foley one more day PCA for pain Consider diuresing some starting tomorrow  Acute blood loss anemia trend for now  Ruta Hinds 12/16/2014 7:59 AM --  Laboratory Lab Results:  Recent Labs  12/15/14 1247 12/16/14 0443  WBC 14.3* 14.3*  HGB 11.4* 12.3*  HCT 35.0* 36.8*  PLT 149* 144*   BMET  Recent Labs  12/15/14 1247 12/16/14 0443  NA 134* 132*  K 4.5 4.0  CL 104 102  CO2 25 25  GLUCOSE 171* 144*  BUN 15 10  CREATININE 1.14 0.87  CALCIUM 7.7* 7.5*    COAG Lab Results  Component Value Date   INR 1.35 12/15/2014   INR 1.01 12/08/2014   INR 1.0 11/05/2008   No results found for: PTT

## 2014-12-16 NOTE — Anesthesia Postprocedure Evaluation (Signed)
Anesthesia Post Note  Patient: Jerry Gilbert  Procedure(s) Performed: Procedure(s) (LRB): ABDOMINAL AORTIC ANEURYSM REPAIR (N/A)  Anesthesia type: General  Patient location: PACU  Post pain: Pain level controlled  Post assessment: Post-op Vital signs reviewed  Last Vitals: BP 150/83 mmHg  Pulse 99  Temp(Src) 36.8 C (Oral)  Resp 19  Ht 5\' 11"  (1.803 m)  Wt 168 lb (76.204 kg)  BMI 23.44 kg/m2  SpO2 95%  Post vital signs: Reviewed  Level of consciousness: sedated  Complications: No apparent anesthesia complications

## 2014-12-17 DIAGNOSIS — I10 Essential (primary) hypertension: Secondary | ICD-10-CM

## 2014-12-17 DIAGNOSIS — I714 Abdominal aortic aneurysm, without rupture: Principal | ICD-10-CM

## 2014-12-17 DIAGNOSIS — I480 Paroxysmal atrial fibrillation: Secondary | ICD-10-CM

## 2014-12-17 LAB — TROPONIN I
TROPONIN I: 0.12 ng/mL — AB (ref <0.031)
TROPONIN I: 0.09 ng/mL — AB (ref <0.031)

## 2014-12-17 LAB — CBC
HCT: 34.6 % — ABNORMAL LOW (ref 39.0–52.0)
Hemoglobin: 11.1 g/dL — ABNORMAL LOW (ref 13.0–17.0)
MCH: 27.3 pg (ref 26.0–34.0)
MCHC: 32.1 g/dL (ref 30.0–36.0)
MCV: 85.2 fL (ref 78.0–100.0)
PLATELETS: 129 10*3/uL — AB (ref 150–400)
RBC: 4.06 MIL/uL — AB (ref 4.22–5.81)
RDW: 16.6 % — ABNORMAL HIGH (ref 11.5–15.5)
WBC: 16.4 10*3/uL — AB (ref 4.0–10.5)

## 2014-12-17 LAB — BASIC METABOLIC PANEL
Anion gap: 2 — ABNORMAL LOW (ref 5–15)
BUN: 6 mg/dL (ref 6–20)
CALCIUM: 8 mg/dL — AB (ref 8.9–10.3)
CHLORIDE: 101 mmol/L (ref 101–111)
CO2: 26 mmol/L (ref 22–32)
CREATININE: 0.8 mg/dL (ref 0.61–1.24)
Glucose, Bld: 143 mg/dL — ABNORMAL HIGH (ref 65–99)
Potassium: 4.2 mmol/L (ref 3.5–5.1)
SODIUM: 129 mmol/L — AB (ref 135–145)

## 2014-12-17 MED ORDER — KCL IN DEXTROSE-NACL 20-5-0.9 MEQ/L-%-% IV SOLN
INTRAVENOUS | Status: DC
  Administered 2014-12-17 – 2014-12-19 (×4): via INTRAVENOUS
  Filled 2014-12-17 (×5): qty 1000

## 2014-12-17 MED ORDER — INFLUENZA VAC SPLIT QUAD 0.5 ML IM SUSY
0.5000 mL | PREFILLED_SYRINGE | INTRAMUSCULAR | Status: AC
  Administered 2014-12-18: 0.5 mL via INTRAMUSCULAR
  Filled 2014-12-17: qty 0.5

## 2014-12-17 MED ORDER — ONDANSETRON HCL 4 MG/2ML IJ SOLN
4.0000 mg | Freq: Four times a day (QID) | INTRAMUSCULAR | Status: DC | PRN
  Administered 2014-12-17: 4 mg via INTRAVENOUS

## 2014-12-17 MED ORDER — HYDROMORPHONE 0.3 MG/ML IV SOLN
INTRAVENOUS | Status: DC
  Administered 2014-12-17: 0.3 mg via INTRAVENOUS
  Administered 2014-12-17: 10:00:00 via INTRAVENOUS
  Administered 2014-12-17: 0.59 mg via INTRAVENOUS
  Administered 2014-12-17: 0.6 mg via INTRAVENOUS
  Administered 2014-12-18: 0.4 mg via INTRAVENOUS
  Administered 2014-12-18 – 2014-12-19 (×3): 0.2 mg via INTRAVENOUS
  Administered 2014-12-19: 0.599 mg via INTRAVENOUS
  Administered 2014-12-20: 0.8 mg via INTRAVENOUS
  Administered 2014-12-20: 09:00:00 via INTRAVENOUS
  Filled 2014-12-17: qty 625
  Filled 2014-12-17: qty 25
  Filled 2014-12-17: qty 625

## 2014-12-17 MED ORDER — NALOXONE HCL 0.4 MG/ML IJ SOLN: 0.4000 mg | INTRAMUSCULAR | Status: DC | PRN

## 2014-12-17 MED ORDER — POTASSIUM CHLORIDE 20 MEQ/15ML (10%) PO SOLN
40.0000 meq | ORAL | Status: AC
  Administered 2014-12-17 (×2): 40 meq
  Filled 2014-12-17 (×2): qty 30

## 2014-12-17 MED ORDER — PANTOPRAZOLE SODIUM 40 MG PO PACK
40.0000 mg | PACK | Freq: Every day | ORAL | Status: DC
  Administered 2014-12-17 – 2014-12-19 (×3): 40 mg
  Filled 2014-12-17 (×5): qty 20

## 2014-12-17 MED ORDER — DIPHENHYDRAMINE HCL 12.5 MG/5ML PO ELIX: 12.5000 mg | ORAL_SOLUTION | Freq: Four times a day (QID) | ORAL | Status: DC | PRN

## 2014-12-17 MED ORDER — DIPHENHYDRAMINE HCL 50 MG/ML IJ SOLN: 12.5000 mg | Freq: Four times a day (QID) | INTRAMUSCULAR | Status: DC | PRN

## 2014-12-17 MED ORDER — SODIUM CHLORIDE 0.9 % IJ SOLN: 9.0000 mL | INTRAMUSCULAR | Status: DC | PRN

## 2014-12-17 MED ORDER — FUROSEMIDE 10 MG/ML IJ SOLN
40.0000 mg | Freq: Once | INTRAMUSCULAR | Status: AC
  Administered 2014-12-17: 40 mg via INTRAVENOUS
  Filled 2014-12-17: qty 4

## 2014-12-17 NOTE — Progress Notes (Signed)
Pt requested RN call wife because "he wanted to see her, If I can't sleep neither can she". Pt states he has no pain and does not want morphine because it makes him "dream weird things". RN offered repositioning, mouth swabbing and to press his morphine PCA button. RN attempted phone call x1 with no answer. RN offered emotional support. VSS  Will continue to monitor.   Earlie Lou

## 2014-12-17 NOTE — Evaluation (Signed)
Occupational Therapy Evaluation Patient Details Name: Jerry Gilbert MRN: [id number] DOB: 08/28/1940 Today's Date: 12/17/2014    History of Present Illness 74 y.o. male admitted s/p aneurysm abdominal aortic repair 12/15/14 PMH: atrial tachycardia, CAD, HTN, TIA, prostate CA, tobacco abuse,    Clinical Impression   Pt s/p above. Pt independent with ADLs, PTA. Feel pt will benefit from acute OT to increase independence prior to d/c. Pt limited in session by pain and nausea. Do not suspect pt will need follow up OT upon d/c.    Follow Up Recommendations  No OT follow up    Equipment Recommendations  Other (comment) (TBD)    Recommendations for Other Services       Precautions / Restrictions Precautions Precautions: Fall Restrictions Weight Bearing Restrictions: No      Mobility Bed Mobility Overal bed mobility: Needs Assistance;+2 for physical assistance;+ 2 for safety/equipment Bed Mobility: Supine to Sit     Supine to sit: Max assist;+2 for physical assistance;+2 for safety/equipment;HOB elevated     General bed mobility comments: Due to pain and nausea, maintainied HOB elevated using bed pad to pivot pt to sit EOB  Transfers Overall transfer level: Needs assistance Transfers: Sit to/from Stand;Stand Pivot Transfers Sit to Stand: Min guard;+2 safety/equipment Stand pivot transfers: Min guard;+2 safety/equipment       General transfer comment: good balance; stood 25 seconds prior to pivot    Balance  No LOB in session. Balance not formally assessed                                          ADL Overall ADL's : Needs assistance/impaired Eating/Feeding: NPO   Grooming: Wash/dry face;Set up;Supervision/safety;Sitting Grooming Details (indicate cue type and reason): washed part of face/mustache Upper Body Bathing: Moderate assistance;Sitting Upper Body Bathing Details (indicate cue type and reason): suspect due to nausea/pain              Toilet Transfer: +2 for safety/equipment;Min guard;Stand-pivot (from bed to chair)           Functional mobility during ADLs: Min guard;+2 for safety/equipment (stand pivot)       Vision     Perception     Praxis      Pertinent Vitals/Pain Pain Assessment: 0-10 Pain Score: 9  Pain Location: abdomen and chest Pain Descriptors / Indicators: Grimacing Pain Intervention(s): Monitored during session;Limited activity within patient's tolerance     Hand Dominance     Extremity/Trunk Assessment Upper Extremity Assessment Upper Extremity Assessment: Overall WFL for tasks assessed   Lower Extremity Assessment Lower Extremity Assessment: Defer to PT evaluation      Communication Communication Communication: No difficulties   Cognition Arousal/Alertness: Awake/alert Behavior During Therapy: Flat affect Overall Cognitive Status: Within Functional Limits for tasks assessed                     General Comments       Exercises       Shoulder Instructions      Home Living Family/patient expects to be discharged to:: Private residence Living Arrangements: Spouse/significant other Available Help at Discharge: Family;Available 24 hours/day Type of Home: Other(Comment) (RV) Home Access: Stairs to enter Entrance Stairs-Number of Steps: 5 Entrance Stairs-Rails: Right;Left Home Layout: One level     Bathroom Shower/Tub: Walk-in shower;Door   [employer] Toilet: Associate Professor Accessibility: No   Home Equipment: [employer]  seat - built in   Additional Comments: They have built a deck onto RV and enter via these steps. RV with very narrow, tight spaces      Prior Functioning/Environment Level of Independence: Independent        Comments: very active PTA    OT Diagnosis: Acute pain   OT Problem List: Decreased activity tolerance;Pain;Decreased knowledge of precautions;Decreased knowledge of use of DME or AE   OT Treatment/Interventions: DME and/or AE  instruction;Therapeutic activities;Patient/family education;Balance training;Self-care/ADL training;Energy conservation    OT Goals(Current goals can be found in the care plan section) Acute Rehab OT Goals Patient Stated Goal: not stated OT Goal Formulation: With family Time For Goal Achievement: 12/24/14 Potential to Achieve Goals: Good ADL Goals Pt Will Perform Lower Body Bathing: sit to/from stand;with supervision Pt Will Perform Lower Body Dressing: sit to/from stand;with supervision Pt Will Transfer to Toilet: ambulating;with supervision  OT Frequency: Min 2X/week   Barriers to D/C:            Co-evaluation PT/OT/SLP Co-Evaluation/Treatment: Yes Reason for Co-Treatment: For patient/therapist safety;Other (comment) (due to multiple lines, 2 IV poles, and pt activity tolerance)   OT goals addressed during session: ADL's and self-care;Other (comment) (mobility)      End of Session Nurse Communication: Other (comment) (needs tape on NG tube)  Activity Tolerance: Patient limited by pain;Other (comment) (nausea) Patient left: in chair;with family/visitor present;Other (comment);with call bell/phone within reach (with PT)   Time: 4656-8127 OT Time Calculation (min): 28 min Charges:  OT General Charges $OT Visit: 1 Procedure OT Evaluation $Initial OT Evaluation Tier I: 1 Procedure G-CodesBenito Mccreedy OTR/L [phone] 12/17/2014, 10:08 AM

## 2014-12-17 NOTE — Progress Notes (Signed)
Dr. Oneida Alar notified of increased gastric output. No new orders.

## 2014-12-17 NOTE — Progress Notes (Signed)
Pt with c/o nausea. NG tube reconnected to suction after being clamped for med. Immediate return of 1375 ml dark green/brown fluid with dark specks. Pt expressed moderate relief.

## 2014-12-17 NOTE — Progress Notes (Signed)
Pt continues to refuse to press his morphine PCA button.  Will continue to monitor.  Randon Goldsmith, RN

## 2014-12-17 NOTE — Consult Note (Signed)
ELECTROPHYSIOLOGY CONSULT NOTE    Patient ID: Jerry Gilbert MRN: [id number], DOB/AGE: [birth year] 74 y.o.  Admit date: 12/15/2014 Date of Consult: 12/17/2014  Primary Physician: Wyatt Mage, MD Electrophysiologist: Lillieann Pavlich  Reason for Consultation: atrial fibrillation  HPI:  Jerry Gilbert is a 74 y.o. male with a past medical history significant for CAD, HTN, prior TIA, prostate cancer, atrial tach (maintained on Multaq), and AAA s/p repair this admission.  He has been maintained on Multaq and done well without recurrence of symptomatic arrhythmias.  Post op this admission, he has developed atrial fibrillation and been placed on diltiazem drip.  EP has been asked to evaluate for treatment options.  He currently is having surgical abdominal pain and did not sleep well last night.  He is not having chest pain, shortness of breath.  He is minimally aware of AF.  He has had no recent fevers or chills.   Last echo 08/2013 demonstrated normal LV function.   Past Medical History  Diagnosis Date  . Atrial tachycardia ([place])   . CAD (coronary artery disease)     S/P PCI LAD  . Hypertension   . Personal history of unspecified circulatory disease     Transient Ischemic attacks  . Prostate cancer ([place])   . Tobacco abuse   . AAA (abdominal aortic aneurysm) ([place]) 11/04/09    3.6 cmx 3.4 cm  . Anemia   . Stroke ([place])   . Myocardial infarction ([place])   . Heart murmur     in 1961  . Kidney stones      Surgical History:  Past Surgical History  Procedure Laterality Date  . Bare-metal stent      Left circumflex about 10 years ago  . Percutaneous coronary stent intervention (pci-s)      LAD  . Prostate surgery    . Tonsillectomy    . Cardiac catheterization      11/05/2008  . Abdominal aortic aneurysm repair N/A 12/15/2014    Procedure: ABDOMINAL AORTIC ANEURYSM REPAIR;  Surgeon: Elam Dutch, MD;  Location: [facility];  Service: Vascular;  Laterality: N/A;     Prescriptions prior to  admission  Medication Sig Dispense Refill Last Dose  . aspirin EC 81 MG tablet Take 1 tablet (81 mg total) by mouth daily. 90 tablet 3 12/14/2014 at Unknown time  . cloNIDine (CATAPRES) 0.2 MG tablet TAKE ONE TABLET BY MOUTH TWICE DAILY 180 tablet 2 12/15/2014 at 0530  . lisinopril (PRINIVIL,ZESTRIL) 20 MG tablet Take 1 tablet (20 mg total) by mouth 2 (two) times daily. 180 tablet 3 12/15/2014 at 0530  . metoprolol tartrate (LOPRESSOR) 25 MG tablet TAKE ONE TABLET BY MOUTH THREE TIMES DAILY (GENERIC FOR LOPRESSOR) 270 tablet 3 12/15/2014 at 0530  . MULTAQ 400 MG tablet TAKE ONE TABLET BY MOUTH TWICE DAILY WITH MEALS 60 tablet 10 12/15/2014 at 0530  . Multiple Vitamins-Minerals (MULTIVITAMIN WITH MINERALS) tablet Take 1 tablet by mouth daily.   Past Week at Unknown time  . pantoprazole (PROTONIX) 40 MG tablet Take 1 tablet (40 mg total) by mouth 2 (two) times daily. 60 tablet 5 12/15/2014 at 0530  . amLODipine (NORVASC) 2.5 MG tablet Take 1 tablet (2.5 mg total) by mouth daily. 90 tablet 3   . nitroGLYCERIN (NITROSTAT) 0.4 MG SL tablet Place 1 tablet (0.4 mg total) under the tongue every 5 (five) minutes as needed for chest pain. 25 tablet 12 More than a month at Unknown time    Inpatient  Medications:  . docusate sodium  100 mg Oral Daily  . enoxaparin (LOVENOX) injection  40 mg Subcutaneous Q24H  . HYDROmorphone PCA 0.3 mg/mL   Intravenous 6 times per day  . pantoprazole  40 mg Oral Daily  . potassium chloride  40 mEq Per Tube Q2H while awake   . dextrose 5 % and 0.9 % NaCl with KCl 20 mEq/L 50 mL/hr at 12/17/14 0758  . diltiazem (CARDIZEM) infusion 10 mg/hr (12/17/14 0414)     Allergies:  Allergies  Allergen Reactions  . Celecoxib Itching    Itching  . Diclofenac Other (See Comments)    GI bleed    Social History   Social History  . Marital Status: Married    Spouse Name: N/A  . Number of Children: N/A  . Years of Education: N/A   Occupational History  . Retired    Social  History Main Topics  . Smoking status: Current Every Day Smoker -- 1.00 packs/day for 80 years    Types: Cigarettes  . Smokeless tobacco: Never Used  . Alcohol Use: No     Comment: " No alcohol in over a year."  . Drug Use: No     Comment: No illicit drug use and only takes multivitamin as far as herbal medication goes.  . Sexual Activity: Not on file   Other Topics Concern  . Not on file   Social History Narrative   Lives in [place], [place] with his wife   Remains active, working around his house   Diet is regular      Family History  Problem Relation Age of Onset  . Heart attack Mother 56  . Heart disease Mother     < 5  . Coronary artery disease Brother   . Diabetes Brother   . Heart disease Brother     CABG  . Hyperlipidemia Brother   . Hypertension Brother   . Arrhythmia Brother     S/P cardioversion  . Heart disease Father      Review of Systems: All other systems reviewed and are otherwise negative except as noted above.  Physical Exam: Filed Vitals:   12/16/14 2145 12/16/14 2332 12/17/14 0400 12/17/14 0525  BP: 142/60   162/69  Pulse:    86  Temp:    99.2 F (37.3 C)  TempSrc:    Oral  Resp:  20 22   Height:      Weight:      SpO2:  93% 95% 94%    GEN- The patient is well appearing, alert and oriented x 3 today.   HEENT: normocephalic, atraumatic; sclera clear, conjunctiva pink; hearing intact; oropharynx clear; neck supple; +NG tube Lungs- Clear to ausculation bilaterally, normal work of breathing.  No wheezes, rales, rhonchi Heart- Irregular rate and rhythm  GI- incision intact, +tenderness to palpation Extremities- no clubbing, cyanosis, or edema; DP/PT/radial pulses 2+ bilaterally MS- no significant deformity or atrophy Skin- warm and dry, no rash or lesion Psych- euthymic mood, full affect Neuro- strength and sensation are intact  Labs:   Lab Results  Component Value Date   WBC 16.4* 12/17/2014   HGB 11.1* 12/17/2014   HCT 34.6*  12/17/2014   MCV 85.2 12/17/2014   PLT 129* 12/17/2014    Recent Labs Lab 12/16/14 0443  12/17/14 0109  NA 132*  < > 129*  K 4.0  < > 4.2  CL 102  < > 101  CO2 25  < > 26  BUN  10  < > 6  CREATININE 0.87  < > 0.80  CALCIUM 7.5*  < > 8.0*  PROT 5.3*  --   --   BILITOT 0.9  --   --   ALKPHOS 75  --   --   ALT 17  --   --   AST 17  --   --   GLUCOSE 144*  < > 143*  < > = values in this interval not displayed.    Radiology/Studies: Dg Chest Port 1 View 12/16/2014   CLINICAL DATA:  Postop from abdominal aortic aneurysm repair. 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  EXAM: PORTABLE CHEST 1 VIEW  COMPARISON:  12/15/2014  FINDINGS: Improved aeration of left lung is seen with decreased atelectasis demonstrated. There is mild residual atelectasis seen in both lung bases. No pneumothorax visualized. Heart size is stable and within normal limits. Nasogastric tube and right jugular Cordis remain in place.  IMPRESSION: Mild bibasilar atelectasis, with improved aeration of left lung since previous study. No pneumothorax visualized.   Electronically Signed   By: Earle Gell M.D.   On: 12/16/2014 07:27   EUM:PNTIR rhythm, PAC's, rate 87  TELEMETRY: sinus rhythm with conversion to atrial fibrillation w RVR, back to SR on Dilt drip  Assessment/Plan: 1.  New onset atrial fibrillation The patient has new onset atrial fibrillation in the setting of known atrial tachycardia Recent surgery likely contributing to atrial arrhythmias CHADS2VASC is at least 6 and he will require long term anticoagulation when ok with vascular surgery to start For now, would continue Diltiazem drip and resume Multaq when able to tolerate po's  2.  AAA s/p repair Management per VVS  3.  HTN Stable No change required today  Signed, Chanetta Marshall, NP 12/17/2014 7:48 AM   I have seen, examined the patient, and reviewed the above assessment and plan.  Changes to above are made where necessary.  He is well  known to me. On exam, Parry is ill appearing.  Currently RRR with ectopy. Would anticipate cardizem drip while NPO.  Consider resuming oral home meds once taking POs.  Hold off on anticoagulation for now.  He should do ok.  Co Sign: Thompson Grayer, MD 12/17/2014

## 2014-12-17 NOTE — Progress Notes (Signed)
Pt feeling somewhat better after being able to rest in recliner. Requesting to go back to bed. Attempted to have pt ambulate in the process, however, he became hot and weak upon standing. HR increased to 120's, atrial fib with PVC's. Assisted to bed with recovery of HR to 80's, NSR. Pt is using his PCA and is more comfortable. Call bell and phone in reach.

## 2014-12-17 NOTE — Evaluation (Signed)
Physical Therapy Evaluation Patient Details Name: Jerry Gilbert MRN: [id number] DOB: January 31, 1941 Today's Date: 12/17/2014   History of Present Illness  74 y.o. male admitted s/p aneurysm abdominal aortic repair 12/15/14 PMH: atrial tachycardia, CAD, HTN, TIA, prostate CA, tobacco abuse,     Clinical Impression  Patient is s/p above surgery resulting in functional limitations due to the deficits listed below (see PT Problem List). Anticipate good progress as pain and nausea/vomiting lessen.  Patient will benefit from skilled PT to increase their independence and safety with mobility to allow discharge to the venue listed below.       Follow Up Recommendations Home health PT (vs none if progresses well)    Equipment Recommendations  None recommended by PT (TBA, however limited space in RV; ?cane )    Recommendations for Other Services       Precautions / Restrictions Precautions Precautions: Fall Restrictions Weight Bearing Restrictions: No      Mobility  Bed Mobility Overal bed mobility: Needs Assistance;+2 for physical assistance;+ 2 for safety/equipment Bed Mobility: Supine to Sit     Supine to sit: Max assist;+2 for physical assistance;+2 for safety/equipment;HOB elevated     General bed mobility comments: Due to pain and nausea, maintainied HOB elevated using bed pad to pivot pt to sit EOB  Transfers Overall transfer level: Needs assistance Equipment used: 1 person hand held assist Transfers: Sit to/from [employer] Sit to Stand: Min guard;+2 safety/equipment Stand pivot transfers: Min guard;+2 safety/equipment       General transfer comment: good balance; stood 25 seconds prior to pivot  Ambulation/Gait             General Gait Details: deferred due to nausea/vomiting  Stairs            Wheelchair Mobility    Modified Rankin (Stroke Patients Only)       Balance Overall balance assessment: No apparent balance deficits (not  formally assessed)                                           Pertinent Vitals/Pain Per wife, pt's HR 160-180s overnight During session 80-112 (actually higher when in bed and lowered at EOB--?due to incr comfort) SaO2 93-94% on 2L at rest; at rest on room air 88%; on 2L during pivot 88% and incr to 90% within 15 seconds  Pain Assessment: 0-10 Pain Score: 9  Faces Pain Scale: Hurts even more Pain Location: abdomen and chest Pain Descriptors / Indicators: Grimacing Pain Intervention(s): Monitored during session;Limited activity within patient's tolerance    Home Living Family/patient expects to be discharged to:: Private residence Living Arrangements: Spouse/significant other Available Help at Discharge: Family;Available 24 hours/day Type of Home: Other(Comment) (RV) Home Access: Stairs to enter Entrance Stairs-Rails: Right;Left Entrance Stairs-Number of Steps: 5 Home Layout: One level Home Equipment: Shower seat - built in Additional Comments: They have built a deck onto RV and enter via these steps. RV with very narrow, tight spaces    Prior Function Level of Independence: Independent         Comments: very active PTA     Hand Dominance        Extremity/Trunk Assessment   Upper Extremity Assessment: Overall WFL for tasks assessed           Lower Extremity Assessment: Defer to PT evaluation      Cervical /  Trunk Assessment: Kyphotic (due to abd pain)  Communication   Communication: No difficulties  Cognition Arousal/Alertness: Awake/alert Behavior During Therapy: Flat affect Overall Cognitive Status: Within Functional Limits for tasks assessed                      General Comments General comments (skin integrity, edema, etc.): Increased time due to pt's reluctance, nausea, vomiting. Wife encouraging pt to get OOB helpful    Exercises General Exercises - Lower Extremity Ankle Circles/Pumps: Other (comment) (educated on  importance; pt deferred due to N/V)      Assessment/Plan    PT Assessment Patient needs continued PT services  PT Diagnosis Acute pain;Difficulty walking   PT Problem List Decreased activity tolerance;Decreased mobility;Decreased knowledge of use of DME;Pain  PT Treatment Interventions DME instruction;Gait training;Stair training;Functional mobility training;Therapeutic activities;Therapeutic exercise;Patient/family education   PT Goals (Current goals can be found in the Care Plan section) Acute Rehab PT Goals Patient Stated Goal: not stated PT Goal Formulation: With patient/family Time For Goal Achievement: 12/24/14 Potential to Achieve Goals: Good    Frequency Min 3X/week   Barriers to discharge        Co-evaluation PT/OT/SLP Co-Evaluation/Treatment: Yes Reason for Co-Treatment: For patient/therapist safety;Other (comment) (due to multiple lines, 2 IV poles, and pt activity tolerance)   OT goals addressed during session: ADL's and self-care;Other (comment) (mobility)       End of Session Equipment Utilized During Treatment: Oxygen Activity Tolerance: Treatment limited secondary to medical complications (Comment) (nausea/vomiting) Patient left: in chair;with call bell/phone within reach;with family/visitor present           Time: 0851-0929 PT Time Calculation (min) (ACUTE ONLY): 38 min   Charges:   PT Evaluation $Initial PT Evaluation Tier I: 1 Procedure PT Treatments $Therapeutic Activity: 8-22 mins   PT G Codes:        Lashun Mccants 28-Dec-2014, 10:25 AM Pager [phone]

## 2014-12-17 NOTE — Progress Notes (Signed)
[facility]  Subjective  - feels better   Objective 133/71 90 98.8 F (37.1 C) (Oral) 16 90%  Intake/Output Summary (Last 24 hours) at 12/17/14 1450 Last data filed at 12/17/14 1430  Gross per 24 hour  Intake      0 ml  Output   4395 ml  Net  -4395 ml   Abdomen soft   Assessment/Planning: Much more comfortable this afternoon Check CBC BMET in am.  Leave NG for now since bilious output  Ruta Hinds 12/17/2014 2:50 PM --  Laboratory Lab Results:  Recent Labs  12/16/14 1930 12/17/14 0109  WBC 16.7* 16.4*  HGB 11.4* 11.1*  HCT 35.5* 34.6*  PLT 142* 129*   BMET  Recent Labs  12/16/14 1930 12/17/14 0109  NA 129* 129*  K 4.2 4.2  CL 97* 101  CO2 25 26  GLUCOSE 135* 143*  BUN 7 6  CREATININE 0.88 0.80  CALCIUM 7.8* 8.0*    COAG Lab Results  Component Value Date   INR 1.35 12/15/2014   INR 1.01 12/08/2014   INR 1.0 11/05/2008   No results found for: PTT

## 2014-12-17 NOTE — Progress Notes (Signed)
[facility]  Subjective  - did not sleep, pain medicine makes me feel weird   Objective 162/69 86 99.2 F (37.3 C) (Oral) 22 94%  Intake/Output Summary (Last 24 hours) at 12/17/14 0609 Last data filed at 12/17/14 0526  Gross per 24 hour  Intake    735 ml  Output   1685 ml  Net   -950 ml   Abdomen incision intact soft appropriate tenderness Palpable pedal pulses Regular pulse NG minimal  Afib with RVR last pm converted back to sinus on Diltiazem  Assessment/Planning: 1.  Afib now back in sinus on dilt drip.  Pt with history of this in the past followed by Dr Rayann Heman.  Will have him see pt today to assist with antiarrhythmic choice.  Will diurese some starting today.  Mild troponin bump trending down most likely demand.  Will repeat EKG today  2.  Continue NG and Foley for now  3. Change PCA to reduced dose dilaudid  4. Ambulate patient  5.  Hyponatremia will decrease IVF fluid rate to 50 mL/hr d5 ns.  6.  Anemia hemoglobin stable  Ruta Hinds 12/17/2014 6:09 AM --  Laboratory Lab Results:  Recent Labs  12/16/14 1930 12/17/14 0109  WBC 16.7* 16.4*  HGB 11.4* 11.1*  HCT 35.5* 34.6*  PLT 142* 129*   BMET  Recent Labs  12/16/14 1930 12/17/14 0109  NA 129* 129*  K 4.2 4.2  CL 97* 101  CO2 25 26  GLUCOSE 135* 143*  BUN 7 6  CREATININE 0.88 0.80  CALCIUM 7.8* 8.0*    COAG Lab Results  Component Value Date   INR 1.35 12/15/2014   INR 1.01 12/08/2014   INR 1.0 11/05/2008   No results found for: PTT

## 2014-12-18 DIAGNOSIS — I714 Abdominal aortic aneurysm, without rupture: Secondary | ICD-10-CM | POA: Diagnosis not present

## 2014-12-18 LAB — BASIC METABOLIC PANEL
ANION GAP: 7 (ref 5–15)
BUN: 12 mg/dL (ref 6–20)
CALCIUM: 8 mg/dL — AB (ref 8.9–10.3)
CO2: 28 mmol/L (ref 22–32)
Chloride: 96 mmol/L — ABNORMAL LOW (ref 101–111)
Creatinine, Ser: 0.9 mg/dL (ref 0.61–1.24)
GFR calc Af Amer: >60 mL/min (ref >60)
Glucose, Bld: 118 mg/dL — ABNORMAL HIGH (ref 65–99)
POTASSIUM: 3.7 mmol/L (ref 3.5–5.1)
Sodium: 131 mmol/L — ABNORMAL LOW (ref 135–145)

## 2014-12-18 LAB — CBC
HEMATOCRIT: 34.5 % — AB (ref 39.0–52.0)
Hemoglobin: 11.2 g/dL — ABNORMAL LOW (ref 13.0–17.0)
MCH: 27.7 pg (ref 26.0–34.0)
MCHC: 32.5 g/dL (ref 30.0–36.0)
MCV: 85.4 fL (ref 78.0–100.0)
Platelets: 137 10*3/uL — ABNORMAL LOW (ref 150–400)
RBC: 4.04 MIL/uL — ABNORMAL LOW (ref 4.22–5.81)
RDW: 16.3 % — AB (ref 11.5–15.5)
WBC: 13.7 10*3/uL — AB (ref 4.0–10.5)

## 2014-12-18 MED ORDER — FUROSEMIDE 10 MG/ML IJ SOLN
40.0000 mg | Freq: Once | INTRAMUSCULAR | Status: AC
  Administered 2014-12-18: 40 mg via INTRAVENOUS
  Filled 2014-12-18: qty 4

## 2014-12-18 MED ORDER — POTASSIUM CHLORIDE 20 MEQ/15ML (10%) PO SOLN
40.0000 meq | Freq: Once | ORAL | Status: AC
  Administered 2014-12-18: 40 meq via ORAL
  Filled 2014-12-18: qty 30

## 2014-12-18 NOTE — Progress Notes (Signed)
UR Completed. Tramond Slinker, RN, BSN.  [phone] 

## 2014-12-18 NOTE — Progress Notes (Signed)
PT Cancellation Note  Patient Details Name: Jerry Gilbert MRN: [id number] DOB: December 05, 1940   Cancelled Treatment:    Reason Eval/Treat Not Completed: Medical issues which prohibited therapy. Pt/wife/RN reported pt up to chair x 1 this morning with HR up to 160s (briefly) and recently back to bed. Feels he cannot get up now and wants to see MD (?cardiologist) prior to further mobility. HR 94 currently.   Latysha Thackston 12/18/2014, 12:00 PM  Pager [phone]

## 2014-12-18 NOTE — Care Management Note (Signed)
Case Management Note  Patient Details  Name: Jerry Gilbert MRN: [id number] Date of Birth: 1941/02/17  Subjective/Objective:  Pt is s/p AAA.  Pt remains NPO and has NG tube in place.  Pt had new onset A fib; placed on IV Cardizem drip                  Action/Plan:  Pt is from home with wife.  CM will continue to monitor for disposition needs   Expected Discharge Date:                  Expected Discharge Plan:  Home/Self Care  In-House Referral:     Discharge planning Services  CM Consult  Post Acute Care Choice:    Choice offered to:     DME Arranged:    DME Agency:     HH Arranged:    HH Agency:     Status of Service:  In process, will continue to follow  Medicare Important Message Given:  Yes-second notification given Date Medicare IM Given:    Medicare IM give by:    Date Additional Medicare IM Given:    Additional Medicare Important Message give by:     If discussed at [place] of Stay Meetings, dates discussed:    Additional Comments: CM assessed pt, daughter at bedside. [name], RN 12/18/2014, 12:26 PM

## 2014-12-18 NOTE — Progress Notes (Signed)
SUBJECTIVE: The patient is stable today.  His wife is concerned about heart rates.  He is now back in sinus  CURRENT MEDICATIONS: . docusate sodium  100 mg Oral Daily  . enoxaparin (LOVENOX) injection  40 mg Subcutaneous Q24H  . HYDROmorphone PCA 0.3 mg/mL   Intravenous 6 times per day  . Influenza vac split quadrivalent PF  0.5 mL Intramuscular Tomorrow-1000  . pantoprazole sodium  40 mg Per Tube Daily   . dextrose 5 % and 0.9 % NaCl with KCl 20 mEq/L 50 mL/hr at 12/18/14 0443  . diltiazem (CARDIZEM) infusion 12.5 mg/hr (12/18/14 1024)    OBJECTIVE: Physical Exam: Filed Vitals:   12/17/14 2202 12/18/14 0000 12/18/14 0400 12/18/14 0517  BP: 135/68   144/74  Pulse: 86   78  Temp: 99.5 F (37.5 C)   99 F (37.2 C)  TempSrc: Oral   Oral  Resp: 18 17 24 18   Height:      Weight:      SpO2: 94% 94% 91% 92%    Intake/Output Summary (Last 24 hours) at 12/18/14 1058 Last data filed at 12/18/14 0800  Gross per 24 hour  Intake      0 ml  Output   1600 ml  Net  -1600 ml    Telemetry reveals atrial fibrillation, V rates 80-100's  GEN- The patient is ill appearing, sleeping but rouses Head- normocephalic, atraumatic Eyes-  Sclera clear, conjunctiva pink Ears- hearing intact Oropharynx- clear, +NG tube Neck- supple  Lungs- Clear to ausculation bilaterally, normal work of breathing Heart- regular rate and rhythm  GI- incision intact, soft, NT, ND  Extremities- no clubbing, cyanosis, or edema Skin- no rash or lesion Psych- euthymic mood, full affect Neuro- strength and sensation are intact  LABS: Basic Metabolic Panel:  Recent Labs  12/16/14 0443 12/16/14 1930 12/17/14 0109 12/18/14 0014  NA 132* 129* 129* 131*  K 4.0 4.2 4.2 3.7  CL 102 97* 101 96*  CO2 25 25 26 28   GLUCOSE 144* 135* 143* 118*  BUN 10 7 6 12   CREATININE 0.87 0.88 0.80 0.90  CALCIUM 7.5* 7.8* 8.0* 8.0*  MG 2.0 2.0  --   --    Liver Function Tests:  Recent Labs  12/16/14 0443  AST 17    ALT 17  ALKPHOS 75  BILITOT 0.9  PROT 5.3*  ALBUMIN 2.7*    Recent Labs  12/16/14 0443  AMYLASE 58   CBC:  Recent Labs  12/17/14 0109 12/18/14 0014  WBC 16.4* 13.7*  HGB 11.1* 11.2*  HCT 34.6* 34.5*  MCV 85.2 85.4  PLT 129* 137*   RADIOLOGY: Dg Chest Port 1 View 12/16/2014   CLINICAL DATA:  Postop from abdominal aortic aneurysm repair. 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  EXAM: PORTABLE CHEST 1 VIEW  COMPARISON:  12/15/2014  FINDINGS: Improved aeration of left lung is seen with decreased atelectasis demonstrated. There is mild residual atelectasis seen in both lung bases. No pneumothorax visualized. Heart size is stable and within normal limits. Nasogastric tube and right jugular Cordis remain in place.  IMPRESSION: Mild bibasilar atelectasis, with improved aeration of left lung since previous study. No pneumothorax visualized.   Electronically Signed   By: Earle Gell M.D.   On: 12/16/2014 07:27     ASSESSMENT AND PLAN:  Active Problems:   AAA (abdominal aortic aneurysm) ([place])  1. New onset atrial fibrillation The patient has new onset atrial fibrillation in the setting of known atrial  tachycardia Recent surgery likely contributing to atrial arrhythmias CHADS2VASC is at least 6 and he will require long term anticoagulation (ok for Heparin tomorrow per vascular surgery - prob would be better served with NOAC) Rate control is reasonable, continue Diltiazem drip and resume Multaq when able to tolerate po's  2. AAA s/p repair Management per VVS  3. HTN Stable No change required today  Chanetta Marshall, NP 12/18/2014 11:01 AM  I have seen, examined the patient, and reviewed the above assessment and plan.  On exam, RRR. Changes to above are made where necessary.  Would keep on IV diltiazem.  Once taking POs will convert diltiazem to oral.  May require a short course of amiodarone postoperatively though I would like to avoid this if able.  I do not think that  we need to start IV heparin.  Would anticipate oral anticoagulation at discharge.   Co Sign: Thompson Grayer, MD 12/18/2014 1:29 PM

## 2014-12-18 NOTE — Progress Notes (Signed)
PT Cancellation Note  Patient Details Name: Jerry Gilbert MRN: [id number] DOB: 12/02/1940   Cancelled Treatment:    Reason Eval/Treat Not Completed: Pt continues to refuse PT. States he has a terrible headache, chest is sore (best he can describe), "and I've just been stuck 6 times and they still didn't get it (IV started)" Wife gently encouraging him. Discussed benefits of activity and risks of inactivity. Pt continued to adamantly refuse.   Mckynzie Liwanag 12/18/2014, 4:07 PM  Pager [phone]

## 2014-12-18 NOTE — Progress Notes (Addendum)
  [facility] Progress Note  Subjective  - POD #3  Having upper epigastric discomfort. Denies chest pain and shortness of breath  Objective Filed Vitals:   12/18/14 0517  BP: 144/74  Pulse: 78  Temp: 99 F (37.2 C)  Resp: 18  Afib, rate 120s-160s  Intake/Output Summary (Last 24 hours) at 12/18/14 0809 Last data filed at 12/18/14 0500  Gross per 24 hour  Intake      0 ml  Output   4295 ml  Net  -4295 ml   General: ill appearing, in NAD CV: irregularly irregular Abdomen: soft, mild tenderness to palpation, incision intact.  Extremities: palpable right DP, left foot warm and well perfused.   Assessment/Planning: 74 y.o. male is s/p: open repair of abdominal aortic aneurysm 3 Days Post-Op   Continue NG tube due to high output. Keep NPO.  New onset atrial fibrillation: Currently on diltiazem drip. BP stable. Prn lopressor. Per Dr. Rayann Heman. Appreciate his assistance. Good UOP.    Alvia Grove 12/18/2014 8:09 AM --  Agree with above. NG output slowing still no flatus Atrial tachycardia per Dr Rayann Heman Ambulate in hallway Hopefully NG out tomorrow Ok to start heparin today if indicated by Dr Rayann Heman Continue gentle diuresis today  Wife fairly obsessed with pt's heart rate which limited pt's ambulation yesterday Hopefully will have improved control after he has diuresed and NG is no longer necessary as these fluid shifts are contributing to atrial irritability.  Emphasized to her this morning that we are working on all of these issues but she seems fixated on his heart rate.  Ruta Hinds, MD [facility] [place] Office: [phone] Pager: [phone]    Laboratory CBC    Component Value Date/Time   WBC 13.7* 12/18/2014 0014   HGB 11.2* 12/18/2014 0014   HCT 34.5* 12/18/2014 0014   PLT 137* 12/18/2014 0014    BMET    Component Value Date/Time   NA 131* 12/18/2014 0014   K 3.7 12/18/2014 0014   CL 96*  12/18/2014 0014   CO2 28 12/18/2014 0014   GLUCOSE 118* 12/18/2014 0014   BUN 12 12/18/2014 0014   CREATININE 0.90 12/18/2014 0014   CALCIUM 8.0* 12/18/2014 0014   GFRNONAA >60 12/18/2014 0014   GFRAA >60 12/18/2014 0014    COAG Lab Results  Component Value Date   INR 1.35 12/15/2014   INR 1.01 12/08/2014   INR 1.0 11/05/2008   No results found for: PTT  Antibiotics Anti-infectives    Start     Dose/Rate Route Frequency Ordered Stop   12/15/14 2030  cefUROXime (ZINACEF) 1.5 g in dextrose 5 % 50 mL IVPB     1.5 g 100 mL/hr over 30 Minutes Intravenous Every 12 hours 12/15/14 1428 12/16/14 0839   12/15/14 1200  cefUROXime (ZINACEF) 750 mg in dextrose 5 % 50 mL IVPB  Status:  Discontinued     750 mg 100 mL/hr over 30 Minutes Intravenous To Surgery 12/15/14 1155 12/15/14 1426   12/15/14 0700  cefUROXime (ZINACEF) 1.5 g in dextrose 5 % 50 mL IVPB     1.5 g 100 mL/hr over 30 Minutes Intravenous To ShortStay Surgical 12/14/14 1259 12/15/14 [place], PA-C [facility] Office: [phone] Pager: [phone] 12/18/2014 8:09 AM

## 2014-12-18 NOTE — Care Management Important Message (Signed)
Important Message  Patient Details  Name: Jerry Gilbert MRN: [id number] Date of Birth: March 05, 1941   Medicare Important Message Given:  Yes-second notification given    Nathen May 12/18/2014, 11:12 AM

## 2014-12-19 DIAGNOSIS — I4891 Unspecified atrial fibrillation: Secondary | ICD-10-CM

## 2014-12-19 NOTE — Care Management Note (Signed)
Case Management Note  Patient Details  Name: Jerry Gilbert MRN: [id number] Date of Birth: 1940-08-22  Subjective/Objective:  Pt is s/p AAA.  Pt remains NPO and has NG tube in place.  Pt had new onset A fib; placed on IV Cardizem drip                  Action/Plan:  Pt is from home with wife.  CM will continue to monitor for disposition needs   Expected Discharge Date:                  Expected Discharge Plan:  Home/Self Care  In-House Referral:     Discharge planning Services  CM Consult  Post Acute Care Choice:    Choice offered to:     DME Arranged:    DME Agency:     HH Arranged:    HH Agency:     Status of Service:  In process, will continue to follow  Medicare Important Message Given:  Yes-second notification given Date Medicare IM Given:    Medicare IM give by:    Date Additional Medicare IM Given:    Additional Medicare Important Message give by:     If discussed at [place] of Stay Meetings, dates discussed:    Additional Comments: 12/19/2014 PT recommended possible need for PT at discharge- therefore [place] has not been arranged. CM wrote physician sticky note requesting [place] Orders be written prior to discharge if deemed necessary.  12/18/14  CM assessed pt, daughter at bedside. [name], RN 12/19/2014, 1:54 PM

## 2014-12-19 NOTE — Progress Notes (Addendum)
  [facility] Progress Note  Subjective  - POD #4  Feels better today. Passing flatus.   Objective Filed Vitals:   12/19/14 0400  BP:   Pulse:   Temp:   Resp: 21   Tmax 99.3 BP sys 130s-140s Afib rate 90s-100s  Intake/Output Summary (Last 24 hours) at 12/19/14 0750 Last data filed at 12/19/14 0700  Gross per 24 hour  Intake      0 ml  Output   2650 ml  Net  -2650 ml   General: sitting up in bed in NAD CV: irregularly irregular Lungs: clear bilaterally Abdomen: soft, mild diffuse tenderness to palpation Incisions: staple line clean  Assessment/Planning: 74 y.o. male is s/p: open AAA repair 4 Days Post-Op   -S/p AAA repair: Passing flatus. NG output 700cc yesterday. Keep in one more day. Continue ice chips.  -New onset atrial fibrillation: rate better controlled 80s-100s. Titrated down on diltiazem drip. CHADS2VASC at least 6. Heparin not necessary per Dr. Rayann Heman. Start on oral anticoagulation at discharge. Patient's wife expresses great concern over anticoagulation as patient had GI bleed last year.  Appreciate Dr. Jackalyn Lombard assistance -Mobilize more today.    Jerry Gilbert 12/19/2014 7:50 AM -- D/c NG D/c Foley Ambulate Clears in am if ok with NG out Repeat labs tomorrow to check hemoglobin and electrolytes Dr Rayann Heman will weigh risk benefit of anticoagulation vs GI bleed  Jerry Hinds, Jerry Gilbert [facility] [place] Office: [phone] Pager: [phone]  Laboratory CBC    Component Value Date/Time   WBC 13.7* 12/18/2014 0014   HGB 11.2* 12/18/2014 0014   HCT 34.5* 12/18/2014 0014   PLT 137* 12/18/2014 0014    BMET    Component Value Date/Time   NA 131* 12/18/2014 0014   K 3.7 12/18/2014 0014   CL 96* 12/18/2014 0014   CO2 28 12/18/2014 0014   GLUCOSE 118* 12/18/2014 0014   BUN 12 12/18/2014 0014   CREATININE 0.90 12/18/2014 0014   CALCIUM 8.0* 12/18/2014 0014   GFRNONAA >60 12/18/2014 0014   GFRAA >60  12/18/2014 0014    COAG Lab Results  Component Value Date   INR 1.35 12/15/2014   INR 1.01 12/08/2014   INR 1.0 11/05/2008   No results found for: PTT  Antibiotics Anti-infectives    Start     Dose/Rate Route Frequency Ordered Stop   12/15/14 2030  cefUROXime (ZINACEF) 1.5 g in dextrose 5 % 50 mL IVPB     1.5 g 100 mL/hr over 30 Minutes Intravenous Every 12 hours 12/15/14 1428 12/16/14 0839   12/15/14 1200  cefUROXime (ZINACEF) 750 mg in dextrose 5 % 50 mL IVPB  Status:  Discontinued     750 mg 100 mL/hr over 30 Minutes Intravenous To Surgery 12/15/14 1155 12/15/14 1426   12/15/14 0700  cefUROXime (ZINACEF) 1.5 g in dextrose 5 % 50 mL IVPB     1.5 g 100 mL/hr over 30 Minutes Intravenous To ShortStay Surgical 12/14/14 1259 12/15/14 [place], PA-C [facility] Office: [phone] Pager: [phone] 12/19/2014 7:50 AM

## 2014-12-19 NOTE — Progress Notes (Signed)
Cardizem drip was stopped per wife request. Wife stated that the drip was d/c the previous day by the RNs on 10/6. However, the order is still active.  HR 90s. RN paged Dr. Rayann Heman and NP for clarification. Awaiting a call back. Will continue to monitor pt.

## 2014-12-19 NOTE — Progress Notes (Signed)
Physical Therapy Treatment Patient Details Name: Jerry Gilbert MRN: [id number] DOB: [birth year] Today's Date: 12/19/2014    History of Present Illness 74 y.o. male admitted s/p aneurysm abdominal aortic repair; postop N/V and pain control issues 12/15/14 PMH: atrial tachycardia, CAD, HTN, TIA, prostate CA, tobacco abuse,     PT Comments    Patient feeling better and able to walk today! vc for use of DME, however his goal is to not need DME due to tight living space in his RV. Anticipate he will be able to achieve this.   Follow Up Recommendations  Home health PT (vs none if progresses well)     Equipment Recommendations  None recommended by PT (TBA, however limited space in RV; ?cane )    Recommendations for Other Services       Precautions / Restrictions Precautions Precautions: None    Mobility  Bed Mobility Overal bed mobility: Needs Assistance Bed Mobility: Rolling;Sidelying to Sit Rolling: Min assist Sidelying to sit: Min assist;HOB elevated       General bed mobility comments: HOB 45 and rail; vc for technique  Transfers Overall transfer level: Needs assistance Equipment used: Rolling walker (2 wheeled) Transfers: Sit to/from Stand Sit to Stand: Min guard;+2 safety/equipment         General transfer comment: vc for technique  Ambulation/Gait Ambulation/Gait assistance: Min guard (+2 chair follow) Ambulation Distance (Feet): 150 Feet Assistive device: Rolling walker (2 wheeled) Gait Pattern/deviations: Step-through pattern;Decreased stride length Gait velocity: slow   General Gait Details: upright posture; vc for safe use of RW with good carryover; pt's goal to not need RW due to lives in [employer]            Wheelchair Mobility    Modified Rankin (Stroke Patients Only)       Balance Overall balance assessment: No apparent balance deficits (not formally assessed) (generally weak)                                   Cognition Arousal/Alertness: Awake/alert Behavior During Therapy: Flat affect Overall Cognitive Status: Within Functional Limits for tasks assessed                      Exercises      General Comments        Pertinent Vitals/Pain HR 106-118 SaO2 4L 99%; 2L while walking 90-92%; on RA at rest 88% and resumed 2L with 93%  Pain Assessment: Faces Faces Pain Scale: Hurts little more Pain Location: abd Pain Descriptors / Indicators: Operative site guarding Pain Intervention(s): Limited activity within patient's tolerance;Monitored during session;Repositioned;Premedicated before session;PCA encouraged    Home Living                      Prior Function            PT Goals (current goals can now be found in the care plan section) Acute Rehab PT Goals Patient Stated Goal: go home tomorrow Time For Goal Achievement: 12/24/14 Progress towards PT goals: Progressing toward goals    Frequency  Min 3X/week    PT Plan Current plan remains appropriate    Co-evaluation             End of Session Equipment Utilized During Treatment: Oxygen Activity Tolerance: Patient tolerated treatment well Patient left: in chair;with call bell/phone within reach;with family/visitor present;Other (comment) (with MD)  Time: 7353-2992 PT Time Calculation (min) (ACUTE ONLY): 38 min  Charges:  $Gait Training: 38-52 mins                    G Codes:      Deerica Waszak 12-30-14, 9:47 AM Pager [phone]

## 2014-12-19 NOTE — Progress Notes (Signed)
SUBJECTIVE: The patient is stable today.  Eager to eat, maintaining SR  CURRENT MEDICATIONS: . docusate sodium  100 mg Oral Daily  . enoxaparin (LOVENOX) injection  40 mg Subcutaneous Q24H  . HYDROmorphone PCA 0.3 mg/mL   Intravenous 6 times per day  . pantoprazole sodium  40 mg Per Tube Daily   . dextrose 5 % and 0.9 % NaCl with KCl 20 mEq/L 50 mL/hr at 12/19/14 0254  . diltiazem (CARDIZEM) infusion 12.5 mg/hr (12/18/14 1643)    OBJECTIVE: Physical Exam: Filed Vitals:   12/18/14 2133 12/19/14 0342 12/19/14 0400 12/19/14 0818  BP: 147/82 147/81    Pulse: 99 91    Temp: 99.3 F (37.4 C) 99 F (37.2 C)    TempSrc: Oral Oral    Resp: 18 18 21 19   Height:      Weight:      SpO2: 92% 92% 92% 98%    Intake/Output Summary (Last 24 hours) at 12/19/14 7026 Last data filed at 12/19/14 0700  Gross per 24 hour  Intake      0 ml  Output   2650 ml  Net  -2650 ml    Telemetry reveals sinus rhythm  GEN- The patient is ill appearing, sleeping but rouses Head- normocephalic, atraumatic Eyes-  Sclera clear, conjunctiva pink Ears- hearing intact Oropharynx- clear, +NG tube Neck- supple  Lungs- Clear to ausculation bilaterally, normal work of breathing Heart- regular rate and rhythm  GI- incision intact, soft, NT, ND  Extremities- no clubbing, cyanosis, or edema Skin- no rash or lesion Psych- euthymic mood, full affect Neuro- strength and sensation are intact  LABS: Basic Metabolic Panel:  Recent Labs  12/16/14 1930 12/17/14 0109 12/18/14 0014  NA 129* 129* 131*  K 4.2 4.2 3.7  CL 97* 101 96*  CO2 25 26 28   GLUCOSE 135* 143* 118*  BUN 7 6 12   CREATININE 0.88 0.80 0.90  CALCIUM 7.8* 8.0* 8.0*  MG 2.0  --   --    CBC:  Recent Labs  12/17/14 0109 12/18/14 0014  WBC 16.4* 13.7*  HGB 11.1* 11.2*  HCT 34.6* 34.5*  MCV 85.2 85.4  PLT 129* 137*   RADIOLOGY: Dg Chest Port 1 View 12/16/2014   CLINICAL DATA:  Postop from abdominal aortic aneurysm repair.  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  EXAM: PORTABLE CHEST 1 VIEW  COMPARISON:  12/15/2014  FINDINGS: Improved aeration of left lung is seen with decreased atelectasis demonstrated. There is mild residual atelectasis seen in both lung bases. No pneumothorax visualized. Heart size is stable and within normal limits. Nasogastric tube and right jugular Cordis remain in place.  IMPRESSION: Mild bibasilar atelectasis, with improved aeration of left lung since previous study. No pneumothorax visualized.   Electronically Signed   By: Earle Gell M.D.   On: 12/16/2014 07:27     ASSESSMENT AND PLAN:  Active Problems:   AAA (abdominal aortic aneurysm) ([place])  1. New onset atrial fibrillation The patient has new onset atrial fibrillation in the setting of known atrial tachycardia Recent surgery likely contributing to atrial arrhythmias CHADS2VASC is at least 6 and he will require consideration for long term anticoagulation - he had a GI bleed late 2015/early 2016 that required transfusion.   Would resume po Lopressor when taking po's.   2. AAA s/p repair Management per [facility]  3. HTN Stable No change required today  Chanetta Marshall, NP 12/19/2014 9:17 AM   On review, the patient had a very  large GI bleed previously.  It may be best to avoid anticoagulation going forward if able. Dr Caryl Comes is available for questions over the weekend.  Please call with questions.   Thompson Grayer MD

## 2014-12-20 LAB — BASIC METABOLIC PANEL
ANION GAP: 9 (ref 5–15)
BUN: 22 mg/dL — ABNORMAL HIGH (ref 6–20)
CALCIUM: 8.6 mg/dL — AB (ref 8.9–10.3)
CO2: 26 mmol/L (ref 22–32)
CREATININE: 0.81 mg/dL (ref 0.61–1.24)
Chloride: 102 mmol/L (ref 101–111)
Glucose, Bld: 120 mg/dL — ABNORMAL HIGH (ref 65–99)
Potassium: 3.4 mmol/L — ABNORMAL LOW (ref 3.5–5.1)
SODIUM: 137 mmol/L (ref 135–145)

## 2014-12-20 LAB — CBC
HEMATOCRIT: 34.6 % — AB (ref 39.0–52.0)
Hemoglobin: 11.5 g/dL — ABNORMAL LOW (ref 13.0–17.0)
MCH: 28.7 pg (ref 26.0–34.0)
MCHC: 33.2 g/dL (ref 30.0–36.0)
MCV: 86.3 fL (ref 78.0–100.0)
PLATELETS: 212 10*3/uL (ref 150–400)
RBC: 4.01 MIL/uL — ABNORMAL LOW (ref 4.22–5.81)
RDW: 16.3 % — AB (ref 11.5–15.5)
WBC: 10.6 10*3/uL — AB (ref 4.0–10.5)

## 2014-12-20 MED ORDER — MORPHINE SULFATE (PF) 2 MG/ML IV SOLN: 1.0000 mg | INTRAVENOUS | Status: DC | PRN

## 2014-12-20 MED ORDER — METOPROLOL TARTRATE 25 MG PO TABS: 25.0000 mg | ORAL_TABLET | Freq: Two times a day (BID) | ORAL | Status: DC

## 2014-12-20 MED ORDER — ADULT MULTIVITAMIN W/MINERALS CH
1.0000 | ORAL_TABLET | Freq: Every day | ORAL | Status: DC
  Filled 2014-12-20 (×3): qty 1

## 2014-12-20 MED ORDER — DRONEDARONE HCL 400 MG PO TABS
400.0000 mg | ORAL_TABLET | Freq: Two times a day (BID) | ORAL | Status: DC
  Administered 2014-12-20 – 2014-12-22 (×4): 400 mg via ORAL
  Filled 2014-12-20 (×6): qty 1

## 2014-12-20 MED ORDER — AMLODIPINE BESYLATE 5 MG PO TABS: 5.0000 mg | ORAL_TABLET | Freq: Every day | ORAL | Status: DC

## 2014-12-20 MED ORDER — NITROGLYCERIN 0.4 MG SL SUBL: 0.4000 mg | SUBLINGUAL_TABLET | SUBLINGUAL | Status: DC | PRN

## 2014-12-20 MED ORDER — METOPROLOL TARTRATE 25 MG PO TABS
25.0000 mg | ORAL_TABLET | Freq: Two times a day (BID) | ORAL | Status: DC
  Administered 2014-12-20 – 2014-12-22 (×5): 25 mg via ORAL
  Filled 2014-12-20 (×5): qty 1

## 2014-12-20 MED ORDER — ASPIRIN EC 81 MG PO TBEC
81.0000 mg | DELAYED_RELEASE_TABLET | Freq: Every day | ORAL | Status: DC
  Administered 2014-12-20 – 2014-12-22 (×3): 81 mg via ORAL
  Filled 2014-12-20 (×4): qty 1

## 2014-12-20 MED ORDER — ASPIRIN EC 81 MG PO TBEC: 81.0000 mg | DELAYED_RELEASE_TABLET | Freq: Every day | ORAL | Status: DC

## 2014-12-20 MED ORDER — CLONIDINE HCL 0.2 MG PO TABS
0.2000 mg | ORAL_TABLET | Freq: Two times a day (BID) | ORAL | Status: DC
  Administered 2014-12-20 – 2014-12-22 (×4): 0.2 mg via ORAL
  Filled 2014-12-20 (×4): qty 1

## 2014-12-20 MED ORDER — OXYCODONE-ACETAMINOPHEN 5-325 MG PO TABS
1.0000 | ORAL_TABLET | ORAL | Status: DC | PRN
  Administered 2014-12-20 (×2): 1 via ORAL
  Administered 2014-12-21 (×2): 2 via ORAL
  Administered 2014-12-21: 1 via ORAL
  Administered 2014-12-22 (×2): 2 via ORAL
  Filled 2014-12-20 (×2): qty 2
  Filled 2014-12-20: qty 1
  Filled 2014-12-20 (×3): qty 2
  Filled 2014-12-20: qty 1
  Filled 2014-12-20: qty 2

## 2014-12-20 MED ORDER — LISINOPRIL 10 MG PO TABS
20.0000 mg | ORAL_TABLET | Freq: Two times a day (BID) | ORAL | Status: DC
  Administered 2014-12-20 – 2014-12-22 (×4): 20 mg via ORAL
  Filled 2014-12-20 (×4): qty 2

## 2014-12-20 MED ORDER — PANTOPRAZOLE SODIUM 40 MG PO TBEC
40.0000 mg | DELAYED_RELEASE_TABLET | Freq: Two times a day (BID) | ORAL | Status: DC
  Administered 2014-12-20 – 2014-12-22 (×4): 40 mg via ORAL
  Filled 2014-12-20 (×4): qty 1

## 2014-12-20 MED ORDER — AMLODIPINE BESYLATE 5 MG PO TABS
2.5000 mg | ORAL_TABLET | Freq: Every day | ORAL | Status: DC
  Filled 2014-12-20: qty 1

## 2014-12-20 NOTE — Progress Notes (Signed)
Patient ID: JOHNWESLEY Gilbert, male   DOB: [birth year], 74 y.o.   MRN: [id number] Patient is hungry. Had flatus. No nausea or vomiting. In normal sinus rhythm. Abdomen soft staples intact. Easily palpable popliteal pulses bilaterally Will begin liquids and advance as tolerated. Convert to oral home meds. Appreciate cardiology's assistance

## 2014-12-20 NOTE — Progress Notes (Signed)
Patient/wife refused to take amlodipine. Stated that he only took one dose at home and it made him lightheaded and dizzy.  Pt/wife stated that MD came in and told him/her that it was a good thing that he was refusing lovenox. Pt said with his history of bleeding he did not need this. Pt/wife gave explicit instructions for medication administration. I will try to adjust times in [facility]. Pt resting with call bell within reach.  Will continue to monitor. Payton Emerald, RN

## 2014-12-20 NOTE — Progress Notes (Signed)
PT Cancellation Note  Patient Details Name: Jerry Gilbert MRN: [id number] DOB: 02-19-1941   Cancelled Treatment:     PT session cancelled due to BP (179/90). RN notified & requested to hold for time.  Will attempt back later if time allows.      Sarajane Marek, [place] [phone] 12/20/2014

## 2014-12-20 NOTE — Progress Notes (Signed)
Patient Name: Jerry Gilbert Date of Encounter: 12/20/2014  Active Problems:   AAA (abdominal aortic aneurysm) [facility])    Primary Cardiologist: Dr. Rayann Heman Patient Profile: 74 yo male w/ PMH of CAD, HTN, prior TIA, prostate cancer, and atrial tachycardia (on Multaq), admitted 12/15/2014 for open AAA repair. Post-op he developed Atrial Fibrillation and converted to NSR on Diltiazem drip.  SUBJECTIVE: Excited to be able to have clear liquids. Denies any chest pain or palpitations.  OBJECTIVE Filed Vitals:   12/20/14 0902 12/20/14 0931 12/20/14 1022 12/20/14 1031  BP: 154/82 151/95 164/95 139/85  Pulse: 74 72 82 82  Temp:      TempSrc:      Resp: 22 20 21    Height:      Weight:      SpO2: 95% 96% 97%     Intake/Output Summary (Last 24 hours) at 12/20/14 1101 Last data filed at 12/20/14 0839  Gross per 24 hour  Intake    660 ml  Output    850 ml  Net   -190 ml   Filed Weights   12/15/14 0556  Weight: 168 lb (76.204 kg)    PHYSICAL EXAM General: Well developed, well nourished, male in no acute distress. Head: Normocephalic, atraumatic.  Neck: Supple without bruits, JVD not elevated. Lungs:  Resp regular and unlabored, CTA without wheezing or rales. Heart: RRR, S1, S2, no S3, S4, or murmur; no rub. Abdomen: Soft, non-tender, non-distended with normoactive bowel sounds. No hepatomegaly. No rebound/guarding. No obvious abdominal masses. Extremities: No clubbing, cyanosis, or edema. Distal pedal pulses are 2+ bilaterally. Neuro: Alert and oriented X 3. Moves all extremities spontaneously. Psych: Normal affect.  LABS: CBC:  Recent Labs  12/18/14 0014 12/20/14 0336  WBC 13.7* 10.6*  HGB 11.2* 11.5*  HCT 34.5* 34.6*  MCV 85.4 86.3  PLT 137* 149   Basic Metabolic Panel:  Recent Labs  12/18/14 0014 12/20/14 0336  NA 131* 137  K 3.7 3.4*  CL 96* 102  CO2 28 26  GLUCOSE 118* 120*  BUN 12 22*  CREATININE 0.90 0.81  CALCIUM 8.0* 8.6*    TELE:   Sinus  rhythm with rate in 80's - 100's. Peaks in 130's.   Current Medications:  . amLODipine  2.5 mg Oral Daily  . aspirin EC  81 mg Oral Daily  . cloNIDine  0.2 mg Oral BID  . docusate sodium  100 mg Oral Daily  . dronedarone  400 mg Oral BID WC  . enoxaparin (LOVENOX) injection  40 mg Subcutaneous Q24H  . HYDROmorphone PCA 0.3 mg/mL   Intravenous 6 times per day  . lisinopril  20 mg Oral BID  . metoprolol tartrate  25 mg Oral BID  . multivitamin with minerals  1 tablet Oral Daily  . pantoprazole  40 mg Oral BID   . dextrose 5 % and 0.9 % NaCl with KCl 20 mEq/L 50 mL/hr at 12/19/14 2148  . diltiazem (CARDIZEM) infusion Stopped (12/19/14 1331)    ASSESSMENT AND PLAN:  1. New onset atrial fibrillation - The patient had new onset atrial fibrillation in the setting of known atrial tachycardia. Had recent surgery on 12/15/2014 and noted to have gone into atrial fibrillation on 12/16/2014. Converted back to NSR on Diltiazem drip. - This patients CHA2DS2-VASc Score and unadjusted Ischemic Stroke Rate (% per year) is equal to 9.7 % stroke rate/year from a score of 6. Dr. Rayann Heman recommending avoiding anticoagulation however due to history of very  large GI bleed.  - Resume home Lopressor today.  2. AAA (abdominal aortic aneurysm) - s/p repair on 12/15/2014 - managed by [facility]  3. HTN - BP has been 145/71 - 177/89 in the past 24 hours. - Resuming home BP medications today per TCTS.  Otherwise, per admitting team.  Signed, Erma Heritage , PA-C 11:01 AM 12/20/2014 Pager: [phone] Decision has been made for rate control and cnotinued ASA without anticoagulation w hx of GI bleed Continued control of BP  He has a history of bradycardia which might preclude significant up titration of his beta blockers.  I will increase his amlodipine today. Will see again Monday call for questions

## 2014-12-21 NOTE — Progress Notes (Addendum)
[facility] [place]  Subjective  - Tolerated PO's no N/V.  He reports MB yesterday.   Objective 143/69 88 98.4 F (36.9 C) (Oral) 20 94%  Intake/Output Summary (Last 24 hours) at 12/21/14 0800 Last data filed at 12/21/14 0400  Gross per 24 hour  Intake    390 ml  Output    300 ml  Net     90 ml   Heart RRR Lungs non labored breathing Abdomin soft, incision healing well Fett warm and well perfused    Assessment/Planning: POD # 5 Open AAA repair Continue regular diet as tolerates and ambulation. Stopped amlodipine and Lovenox patient refused to take it.  Hx of GI bleed and amlodipine made him dizzy. Plan D/C home tomorrow pending disposition   Theda Sers, EMMA MAUREEN 12/21/2014 8:00 AM --  Laboratory Lab Results:  Recent Labs  12/20/14 0336  WBC 10.6*  HGB 11.5*  HCT 34.6*  PLT 212   BMET  Recent Labs  12/20/14 0336  NA 137  K 3.4*  CL 102  CO2 26  GLUCOSE 120*  BUN 22*  CREATININE 0.81  CALCIUM 8.6*    COAG Lab Results  Component Value Date   INR 1.35 12/15/2014   INR 1.01 12/08/2014   INR 1.0 11/05/2008   No results found for: PTT    I have examined the patient, reviewed and agree with above. Doing well. Cardiac rhythm stable on oral meds. Plan discharge in a.m.  Curt Jews, MD 12/21/2014 10:02 AM

## 2014-12-21 NOTE — Progress Notes (Signed)
Physical Therapy Treatment Patient Details Name: Jerry Gilbert MRN: [id number] DOB: May 14, 1940 Today's Date: 12/21/2014    History of Present Illness 74 y.o. male admitted s/p aneurysm abdominal aortic repair; postop N/V and pain control issues 12/15/14 PMH: atrial tachycardia, CAD, HTN, TIA, prostate CA, tobacco abuse,     PT Comments    Patient reports he has only walked to/from bathroom since last seen by PT. He again thought only PT would walk with him and again clarified with him and his RN that he should be walking with nursing. One staggering loss of balance within first 40 ft of ambulation with head turn to look behind him and speak to RN.    Follow Up Recommendations  No PT follow up     Equipment Recommendations  None recommended by PT    Recommendations for Other Services       Precautions / Restrictions Precautions Precautions: None    Mobility  Bed Mobility Overal bed mobility: Needs Assistance Bed Mobility: Supine to Sit     Supine to sit: Modified independent (Device/Increase time);HOB elevated     General bed mobility comments: Pt insisted on elevating HOB to 45, no rail; went directly supine to long-sit and then pivoted to put feet on the floor; c/o incr abd pain and re-instructed in technique of up/down from his side to minimize use of abd  Transfers Overall transfer level: Needs assistance Equipment used: None Transfers: Sit to/from Stand Sit to Stand: Min guard         General transfer comment: close guard for safety/balance; no imbalance noted  Ambulation/Gait Ambulation/Gait assistance: Min assist Ambulation Distance (Feet): 200 Feet Assistive device: None Gait Pattern/deviations: Step-through pattern;Decreased stride length Gait velocity: slow; barely able to incr velocity on request Gait velocity interpretation: Below normal speed for age/gender General Gait Details: upright posture; no imbalance until he turned his head 90 degrees to  his left to talk to RN and staggered to his Rt    Stairs Stairs:  (pt refused)          Wheelchair Mobility    Modified Rankin (Stroke Patients Only)       Balance Overall balance assessment: Needs assistance                           High level balance activites: Head turns;Turns;Sudden stops;Direction changes High Level Balance Comments: only one loss of balance with head turning (within first 40 feet of walking); no device used due to lives in RV with very little space    Cognition Arousal/Alertness: Awake/alert Behavior During Therapy: Flat affect Overall Cognitive Status: Within Functional Limits for tasks assessed                      Exercises      General Comments        Pertinent Vitals/Pain HR 83-92 SaO2 on RA 90% at rest; walking on RA 92-94%; seated at rest after walk 92% on RA (RN informed)  Pain Assessment: Faces (pt refused to rate 0-10) Faces Pain Scale: Hurts a little bit Pain Location: abd with bed mobility Pain Descriptors / Indicators: Operative site guarding Pain Intervention(s): Limited activity within patient's tolerance;Monitored during session;Other (comment) (re-educated on bed mobility technique)    Home Living                      Prior Function  PT Goals (current goals can now be found in the care plan section) Acute Rehab PT Goals Patient Stated Goal: go home tomorrow Time For Goal Achievement: 12/24/14 Progress towards PT goals: Progressing toward goals    Frequency  Min 3X/week    PT Plan Current plan remains appropriate    Co-evaluation             End of Session Equipment Utilized During Treatment: Gait belt Activity Tolerance: Patient tolerated treatment well Patient left: in chair;with call bell/phone within reach     Time: 0852-0912 PT Time Calculation (min) (ACUTE ONLY): 20 min  Charges:  $Gait Training: 8-22 mins                    G Codes:       Steaven Wholey 12/23/14, 9:27 AM Pager [phone]

## 2014-12-21 NOTE — Progress Notes (Signed)
Patient Name: Jerry Gilbert Date of Encounter: 12/21/2014  Active Problems:   AAA (abdominal aortic aneurysm) [facility])    Primary Cardiologist: Dr. Rayann Heman Patient Profile: 74 yo male w/ PMH of CAD, HTN, prior TIA, prostate cancer, and atrial tachycardia (on Multaq), admitted 12/15/2014 for open AAA repair. Post-op he developed Atrial Fibrillation and converted to NSR on Diltiazem drip.  SUBJECTIVE: Excited to be able to have clear liquids. Denies any chest pain or palpitations.  OBJECTIVE Filed Vitals:   12/20/14 1433 12/20/14 2000 12/20/14 2102 12/21/14 0421  BP: 164/94 170/87 128/72 143/69  Pulse: 92 84  88  Temp: 99.3 F (37.4 C) 98.3 F (36.8 C)  98.4 F (36.9 C)  TempSrc: Oral Oral  Oral  Resp: 21 20  20   Height:      Weight:      SpO2: 95% 99%  94%    Intake/Output Summary (Last 24 hours) at 12/21/14 1203 Last data filed at 12/21/14 0800  Gross per 24 hour  Intake    630 ml  Output    300 ml  Net    330 ml   Filed Weights   12/15/14 0556  Weight: 168 lb (76.204 kg)    PHYSICAL EXAM General: Well developed, well nourished, male in no acute distress. Head: Normocephalic, atraumatic.  Neck: Supple without bruits, JVD not elevated. Lungs:  Resp regular and unlabored, CTA without wheezing or rales. Heart: RRR, S1, S2, no S3, S4, or murmur; no rub. Abdomen: Soft, non-tender, non-distended with normoactive bowel sounds. No hepatomegaly. No rebound/guarding. No obvious abdominal masses. Extremities: No clubbing, cyanosis, or edema. Distal pedal pulses are 2+ bilaterally. Neuro: Alert and oriented X 3. Moves all extremities spontaneously. Psych: Normal affect.  LABS: CBC:  Recent Labs  12/20/14 0336  WBC 10.6*  HGB 11.5*  HCT 34.6*  MCV 86.3  PLT 564   Basic Metabolic Panel:  Recent Labs  12/20/14 0336  NA 137  K 3.4*  CL 102  CO2 26  GLUCOSE 120*  BUN 22*  CREATININE 0.81  CALCIUM 8.6*    TELE:   Sinus rhythm with rate in 80's - 100's.  Peaks in 130's.   Current Medications:  . aspirin EC  81 mg Oral Daily  . cloNIDine  0.2 mg Oral BID  . docusate sodium  100 mg Oral Daily  . dronedarone  400 mg Oral BID WC  . lisinopril  20 mg Oral BID  . metoprolol tartrate  25 mg Oral BID  . multivitamin with minerals  1 tablet Oral Daily  . pantoprazole  40 mg Oral BID      ASSESSMENT AND PLAN:  1. New onset atrial fibrillation - The patient had new onset atrial fibrillation in the setting of known atrial tachycardia. Had recent surgery on 12/15/2014 and noted to have gone into atrial fibrillation on 12/16/2014. Converted back to NSR on Diltiazem drip. - This patients CHA2DS2-VASc Score and unadjusted Ischemic Stroke Rate (% per year) is equal to 9.7 % stroke rate/year from a score of 6. Dr. Rayann Heman recommending avoiding anticoagulation however due to history of very large GI bleed.  - Resume home Lopressor today.  2. AAA (abdominal aortic aneurysm) - s/p repair on 12/15/2014 - managed by [facility]  3. HTN - BP has been 145/71 - 177/89 in the past 24 hours. - Resuming home BP medications today per TCTS.  Otherwise, per admitting team.  Decision has been made for rate control and continued ASA  without anticoagulation w hx of GI bleed  Continued control of BP  He has a history of bradycardia which might preclude significant up titration of his beta blockers.  Has not tolerated amlodipine in the past    holding sinus

## 2014-12-22 MED ORDER — OXYCODONE-ACETAMINOPHEN 5-325 MG PO TABS: 1.0000 | ORAL_TABLET | ORAL | Status: DC | PRN

## 2014-12-22 MED ORDER — METOPROLOL TARTRATE 25 MG PO TABS: 25.0000 mg | ORAL_TABLET | Freq: Two times a day (BID) | ORAL | Status: DC

## 2014-12-22 MED ORDER — DOCUSATE SODIUM 100 MG PO CAPS: 100.0000 mg | ORAL_CAPSULE | Freq: Every day | ORAL | Status: DC

## 2014-12-22 NOTE — Progress Notes (Signed)
   SUBJECTIVE: The patient is doing better today.  At this time, he denies chest pain, shortness of breath, or any new concerns. He is looking forward to going home today  Current medications: . aspirin EC  81 mg Oral Daily  . cloNIDine  0.2 mg Oral BID  . docusate sodium  100 mg Oral Daily  . dronedarone  400 mg Oral BID WC  . lisinopril  20 mg Oral BID  . metoprolol tartrate  25 mg Oral BID  . multivitamin with minerals  1 tablet Oral Daily  . pantoprazole  40 mg Oral BID      OBJECTIVE: Physical Exam: Filed Vitals:   12/21/14 1951 12/22/14 0532 12/22/14 0834 12/22/14 0943  BP: 107/57 125/59 115/55 119/70  Pulse: 71 70 84 76  Temp: 98.5 F (36.9 C) 98 F (36.7 C) 99.1 F (37.3 C)   TempSrc: Oral Oral Oral   Resp: 16 16 18    Height:      Weight:      SpO2: 92% 94% 93%     Intake/Output Summary (Last 24 hours) at 12/22/14 1434 Last data filed at 12/21/14 1826  Gross per 24 hour  Intake    240 ml  Output      0 ml  Net    240 ml    Telemetry reveals sinus rhythm today  GEN- The patient is well appearing, alert and oriented x 3 today.   Head- normocephalic, atraumatic Eyes-  Sclera clear, conjunctiva pink Ears- hearing intact Neck- supple, no JVP Lungs- Clear to ausculation bilaterally, normal work of breathing Heart- Regular rate and rhythm, no murmurs, rubs or gallops, PMI not laterally displaced GI- soft, NT, ND, + BS Extremities- no clubbing, cyanosis, or edema Skin- no rash or lesion Psych- euthymic mood, full affect Neuro- no gross deficits  LABS: Basic Metabolic Panel:  Recent Labs  12/20/14 0336  NA 137  K 3.4*  CL 102  CO2 26  GLUCOSE 120*  BUN 22*  CREATININE 0.81  CALCIUM 8.6*   CBC:  Recent Labs  12/20/14 0336  WBC 10.6*  HGB 11.5*  HCT 34.6*  MCV 86.3  PLT 212    ASSESSMENT AND PLAN:  1. New PAFib, post op, AAA repair, back in SR     Continue the same meds, he will need AFib clinic f/u in 2 weeks,     we ask the clinic  to make him an appointment     He is not on anticoagulation secondary to history of large GI bleed  As per primary care team: 2. HTN 3. AAA repair 12/15/14   Baldwin Jamaica, MD 12/22/2014 2:34 PM    I have seen, examined the patient, and reviewed the above assessment and plan.  On exam, RRR. Changes to above are made where necessary.  OK to discharge to home.  Resume home medicines. Follow-up in AF clinic in 2 weeks.  Return to see me in 6 weeks.  Co Sign: Thompson Grayer, MD 12/22/2014 3:29 PM

## 2014-12-22 NOTE — Progress Notes (Addendum)
  AAA Progress Note    12/22/2014 7:25 AM 7 Days Post-Op  Subjective:  Ready to go home  Tm 99 now afebrile HR 70's NSR 174'B-449'Q systolic 75% RA  Filed Vitals:   12/22/14 0532  BP: 125/59  Pulse: 70  Temp: 98 F (36.7 C)  Resp: 16    Physical Exam: Cardiac:  regular Lungs:  CTAB Abdomen:  Soft; +BS; BM on 12/20/14 Incisions:  C/d/i with staples in tact Extremities:  Bilateral feet are warm and well perfused; + palpable right DP pulse  CBC    Component Value Date/Time   WBC 10.6* 12/20/2014 0336   RBC 4.01* 12/20/2014 0336   HGB 11.5* 12/20/2014 0336   HCT 34.6* 12/20/2014 0336   PLT 212 12/20/2014 0336   MCV 86.3 12/20/2014 0336   MCH 28.7 12/20/2014 0336   MCHC 33.2 12/20/2014 0336   RDW 16.3* 12/20/2014 0336   LYMPHSABS 1.5 09/22/2010 1445   MONOABS 0.5 09/22/2010 1445   EOSABS 0.1 09/22/2010 1445   BASOSABS 0.0 09/22/2010 1445    BMET    Component Value Date/Time   NA 137 12/20/2014 0336   K 3.4* 12/20/2014 0336   CL 102 12/20/2014 0336   CO2 26 12/20/2014 0336   GLUCOSE 120* 12/20/2014 0336   BUN 22* 12/20/2014 0336   CREATININE 0.81 12/20/2014 0336   CALCIUM 8.6* 12/20/2014 0336   GFRNONAA >60 12/20/2014 0336   GFRAA >60 12/20/2014 0336    INR    Component Value Date/Time   INR 1.35 12/15/2014 1247     Intake/Output Summary (Last 24 hours) at 12/22/14 0725 Last data filed at 12/21/14 1826  Gross per 24 hour  Intake    580 ml  Output    151 ml  Net    429 ml     Assessment/Plan:  74 y.o. male is s/p  Open AAA repair 7 Days Post-Op  -pt doing well this am -last BM was on 12/20/14-recommended he take stool softener while taking narcotics -pt remains in NSR-he is not taking his Norvasc as he states it made him dizzy -asa only as he has hx of GIB -dc home today -f/u with Dr. Oneida Alar next week with post op visit and removal of staples -no statin - hx of muscle wasting and diarrhea   Leontine Locket, PA-C [facility] [phone] 12/22/2014 7:25 AM  Agree with above.  Incision healing.  Tolerating diet.   D/c home  Ruta Hinds, MD [facility] [place] Office: [phone] Pager: [phone]

## 2014-12-22 NOTE — Discharge Summary (Signed)
AAA Discharge Summary    Jerry Gilbert [birth year] 74 y.o. male  [id number]  Admission Date: 12/15/2014  Discharge Date: 12/22/14  Physician: Elam Dutch, MD  Admission Diagnosis: Abdominal aortic aneurysm I71.4   HPI:   This is a 74 y.o. male who presents for follow-up evaluation of abdominal aortic aneurysm. The patient has a known aneurysm that has been followed for several years. It was last measured in September 2014 at 3.9 cm. The patient denies any back or abdominal pain. He did have a severe GI bleed earlier this year which required 16 units of blood. He has not had any bleeding since then. He is currently on aspirin 81 mg once daily. He is tolerating this. He does smoke and currently does not have any intentions to quit. He had right-sided chest pain which lasted approximately 10 minutes last week. He stated the pain level was a 10 out of 10. This resolved without intervention. He does not recall if this was similar to his prior angina. He denies any chest pain currently. He has no shortness of breath currently. He states he did have an episode of increased blood pressure during the episode of chest pain. Other medical problems include atrial tachycardia, coronary artery disease, hypertension, history of prostate cancer all of which are currently stable. He returns today for follow-up after recent CT angiogram of the abdomen and pelvis.   Hospital Course:  The patient was admitted to the hospital and taken to the operating room on 12/15/2014 and underwent: Repair of abdominal aortic aneurysm.  Intraoperative findings include the following:    18 mm Dacron graft from just below take off of renal arteries to the aortic bifurcation.  The pt tolerated the procedure well and was transported to the PACU in good condition. He was having good UOP, feet were pink with PT doppler signal.  He was transferred to the telemetry floor on POD 1.  His aline and swan sleeve were d/c'd.     On POD 2, he did have atrial fibrillation and was started on diltiazem and did convert back to NSR, but went back into Afib.  A cardiology consult was obtained.  He would need long term anticoagulation and Multaq started back when taking po's.  Pt's NGT was clamped and he began having nausea.  This was reconnected to suction.  He also had hyponatremia and his IVF was decreased to 22ml/hr of NS.  On POD 3, he continued to have increased NGT output, but it was decreasing.    Given the pt's hx of GIB, risks vs benefits of anticoagulation were considered.  The decision was made for rate control and continued aspirin without anticoagulation due to hx of GIB.    Clear liquids were started on POD 5.  He tolerated this well without any nausea and vomiting.  Pt's Amlodipine was discontinued due to the fact it makes him dizzy and he did refuse the Lovenox.  On POD 7, he was tolerating a full diet, he was in NSR.  He was not placed on a statin due to hx of muscle wasting and diarrhea per pt.  He was discharged home on this day.  He did have acute blood loss anemia, but did not receive a blood transfusion.  The remainder of the hospital course consisted of increasing mobilization and increasing intake of solids without difficulty.  CBC    Component Value Date/Time   WBC 10.6* 12/20/2014 0336   RBC 4.01* 12/20/2014 0336   HGB  11.5* 12/20/2014 0336   HCT 34.6* 12/20/2014 0336   PLT 212 12/20/2014 0336   MCV 86.3 12/20/2014 0336   MCH 28.7 12/20/2014 0336   MCHC 33.2 12/20/2014 0336   RDW 16.3* 12/20/2014 0336   LYMPHSABS 1.5 09/22/2010 1445   MONOABS 0.5 09/22/2010 1445   EOSABS 0.1 09/22/2010 1445   BASOSABS 0.0 09/22/2010 1445    BMET    Component Value Date/Time   NA 137 12/20/2014 0336   K 3.4* 12/20/2014 0336   CL 102 12/20/2014 0336   CO2 26 12/20/2014 0336   GLUCOSE 120* 12/20/2014 0336   BUN 22* 12/20/2014 0336   CREATININE 0.81 12/20/2014 0336   CALCIUM 8.6* 12/20/2014 0336    GFRNONAA >60 12/20/2014 0336   GFRAA >60 12/20/2014 0336     Discharge Instructions    ABDOMINAL PROCEDURE/ANEURYSM REPAIR/AORTO-BIFEMORAL BYPASS:  Call MD for increased abdominal pain; cramping diarrhea; nausea/vomiting    Complete by:  As directed      Call MD for:  redness, tenderness, or signs of infection (pain, swelling, bleeding, redness, odor or green/yellow discharge around incision site)    Complete by:  As directed      Call MD for:  severe or increased pain, loss or decreased feeling  in affected limb(s)    Complete by:  As directed      Call MD for:  temperature >100.5    Complete by:  As directed      Discharge wound care:    Complete by:  As directed   Shower daily with soap and water starting 12/22/14     Driving Restrictions    Complete by:  As directed   No driving for 2 weeks     Lifting restrictions    Complete by:  As directed   No lifting for 4 weeks     Resume previous diet    Complete by:  As directed            Discharge Diagnosis:  Abdominal aortic aneurysm 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 CAD (coronary artery disease) 12/08/2014  . Preoperative cardiovascular examination 12/08/2014  . 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 Chest pain on breathing 11/20/2014  . AAA (abdominal aortic aneurysm) without rupture ([place]) 11/06/2013  . AAA (abdominal aortic aneurysm) ([place]) 08/29/2013  . Anemia due to blood loss 08/29/2013  . Gastroduodenal ulcer 08/29/2013  . Dizziness 11/30/2011  . Exertional shortness of breath 11/30/2011  . Aortic aneurysm ([place]) 11/03/2009  . UNSPECIFIED PERIPHERAL VASCULAR DISEASE 11/03/2009  . TOBACCO ABUSE 12/02/2008  . 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 Coronary atherosclerosis 12/02/2008  . Atrial tachycardia ([place]) 12/02/2008  . PROSTATE CANCER, HX OF 12/02/2008  . TRANSIENT ISCHEMIC ATTACKS, HX OF 12/02/2008  . 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 Absolute anemia 02/27/2007  . Borderline diabetes 02/27/2007  . Breathlessness on exertion 02/20/2007  . Hypercholesteremia 07/12/2006  . Actinic keratoses 06/15/2006  . CA of prostate ([place]) 06/15/2006  . Blood in feces 05/11/2006  . 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 Essential (primary) hypertension 03/16/2006  . Compulsive tobacco user syndrome 03/16/2006  . Temporary cerebral vascular dysfunction 03/16/2006   Past Medical History  Diagnosis Date  . Atrial tachycardia ([place])   . CAD (coronary artery disease)     S/P PCI LAD  . Hypertension   . Personal history of unspecified circulatory disease     Transient Ischemic attacks  .  Prostate cancer ([place])   . Tobacco abuse   . AAA (abdominal aortic aneurysm) ([place]) 11/04/09    3.6 cmx 3.4 cm  . Anemia   . Stroke ([place])   . Myocardial infarction ([place])   . Heart murmur     in 1961  . Kidney stones        Medication List    STOP taking these medications        amLODipine 2.5 MG tablet  Commonly known as:  NORVASC      TAKE these medications        aspirin EC 81 MG tablet  Take 1 tablet (81 mg total) by mouth daily.     cloNIDine 0.2 MG tablet  Commonly known as:  CATAPRES  TAKE ONE TABLET BY MOUTH TWICE DAILY     docusate sodium 100 MG capsule  Commonly known as:  COLACE  Take 1 capsule (100 mg total) by mouth daily.     lisinopril 20 MG tablet  Commonly known as:  PRINIVIL,ZESTRIL  Take 1 tablet (20 mg total) by mouth 2 (two) times daily.     metoprolol tartrate 25 MG tablet  Commonly known as:  LOPRESSOR  Take 1 tablet (25 mg total) by mouth 2 (two) times daily. TAKE ONE TABLET BY MOUTH THREE TIMES DAILY (GENERIC FOR LOPRESSOR)     MULTAQ 400 MG tablet  Generic drug:  dronedarone  TAKE ONE TABLET BY MOUTH TWICE DAILY WITH MEALS     multivitamin with minerals tablet  Take 1 tablet by mouth daily.     nitroGLYCERIN 0.4 MG SL tablet  Commonly known as:  NITROSTAT  Place 1 tablet (0.4 mg total)  under the tongue every 5 (five) minutes as needed for chest pain.     oxyCODONE-acetaminophen 5-325 MG tablet  Commonly known as:  PERCOCET/ROXICET  Take 1 tablet by mouth every 4 (four) hours as needed for moderate pain.     pantoprazole 40 MG tablet  Commonly known as:  PROTONIX  Take 1 tablet (40 mg total) by mouth 2 (two) times daily.        Prescriptions given: 1.  Percocet #30 No Refill  Instructions: 1.  Shower daily with soap and water starting 12/22/14 2.  No heavy lifting for 4 weeks 3.  No driving x 3 weeks   Disposition: home  Patient's condition: is Good  Follow up: 1. Dr. Oneida Alar in 2 weeks   Leontine Locket, PA-C [facility] [phone] 12/22/2014  7:39 AM   - For VQI Registry use ---   Post-op:  Time to Extubation: [x]  In OR, [ ]  < 12 hrs, [ ]  12-24 hrs, [ ]  >=24 hrs Vasopressors Req. Post-op: Cardizem for Afib ICU Stay: 1 day in ICU Transfusion: No  If yes, n/a units given MI: No, [ ]  Troponin only, [ ]  EKG or Clinical New Arrhythmia: Yes-Afib  Complications: CHF: No Resp failure: No, [ ]  Pneumonia, [ ]  Ventilator Chg in renal function: No, [ ]  Inc. Cr > 0.5, [ ]  Temp. Dialysis, [ ]  Permanent dialysis Leg ischemia: No, no Surgery needed, [ ]  Yes, Surgery needed, [ ]  Amputation Bowel ischemia: No, [ ]  Medical Rx, [ ]  Surgical Rx Wound complication: No, [ ]  Superficial separation/infection, [ ]  Return to OR Return to OR: No  Return to OR for bleeding: No Stroke: No, [ ]  Minor, [ ]  Major  Discharge medications: Statin use:  No If No: [ ]  For Medical reasons, [ ]   Non-compliant ASA use:  Yes  If No: [ ]  For Medical reasons, [ ]  Non-compliant Plavix use:  No If No: [ ]  For Medical reasons, [ ]  Non-compliant Beta blocker use:  Yes If No: [ ]  For Medical reasons, [ ]  Non-compliant ACEI use:  yes

## 2014-12-22 NOTE — Progress Notes (Signed)
12/22/2014 1145 Discharge AVS meds taken today and those due this evening reviewed.  Follow-up appointments and when to call md reviewed.  D/C IV and TELE.  Questions and concerns addressed.   D/C home per orders. [place]

## 2014-12-22 NOTE — Evaluation (Signed)
Occupational Therapy Evaluation Patient Details Name: Jerry Gilbert MRN: [id number] DOB: 02-Mar-1941 Today's Date: 12/22/2014    History of Present Illness 74 y.o. male admitted s/p aneurysm abdominal aortic repair; postop N/V and pain control issues 12/15/14 PMH: atrial tachycardia, CAD, HTN, TIA, prostate CA, tobacco abuse,    Clinical Impression   Pt progressing. Education provided in session.     Follow Up Recommendations  No OT follow up    Equipment Recommendations  3 in 1 bedside comode    Recommendations for Other Services       Precautions / Restrictions Restrictions Weight Bearing Restrictions: No      Mobility Bed Mobility Overal bed mobility: Needs Assistance Bed Mobility: Supine to Sit;Sit to Supine     Supine to sit: Min assist Sit to supine: Modified independent (Device/Increase time)   General bed mobility comments: cues for log rolling. Flattened HOB to get out of bed.  Transfers Overall transfer level: Needs assistance   Transfers: Sit to/from Stand Sit to Stand: Supervision              Balance    No LOB in session.                                         ADL Overall ADL's : Needs assistance/impaired     Grooming: Wash/dry face;Brushing hair;Set up;Supervision/safety;Standing   Upper Body Bathing: Supervision/ safety;Set up;Standing Upper Body Bathing Details (indicate cue type and reason): washed armpits       Upper Body Dressing Details (indicate cue type and reason): OT assisted with gown so pt could wash underarms   Lower Body Dressing Details (indicate cue type and reason): pt able to reach both socks in session Toilet Transfer: Min guard;Ambulation;Regular Toilet;Grab bars (supervision-sit to stand)   Toileting- Clothing Manipulation and Hygiene: Supervision/safety (standing/sitting)       Functional mobility during ADLs: Min guard General ADL Comments: Educated on energy conservation techniques and  safety. Pt taking breaks in session.  Explained therapy/walking is beneficial.     Vision     Perception     Praxis      Pertinent Vitals/Pain Pain Assessment: Faces Faces Pain Scale: Hurts even more Pain Location: abdomen Pain Intervention(s): Monitored during session;Repositioned     Hand Dominance     Extremity/Trunk Assessment             Communication     Cognition Arousal/Alertness: Awake/alert Behavior During Therapy: Flat affect Overall Cognitive Status: Within Functional Limits for tasks assessed                     General Comments       Exercises       Shoulder Instructions      Home Living                                          Prior Functioning/Environment               OT Diagnosis:     OT Problem List:     OT Treatment/Interventions:      OT Goals(Current goals can be found in the care plan section) Acute Rehab OT Goals Patient Stated Goal: not stated OT Goal Formulation: With family Time For Goal Achievement:  12/24/14 Potential to Achieve Goals: Good ADL Goals Pt Will Perform Upper Body Bathing: standing;with supervision (including gathering items) Pt Will Perform Lower Body Bathing: sit to/from stand;with supervision Pt Will Perform Lower Body Dressing: sit to/from stand;with supervision Pt Will Transfer to Toilet: ambulating;with supervision  OT Frequency: Min 2X/week   Barriers to D/C:            Co-evaluation              End of Session Equipment Utilized During Treatment: Gait belt Nurse Communication: Mobility status  Activity Tolerance: Patient limited by fatigue Patient left: in bed;with call bell/phone within reach   Time: [phone] (Approximately 4 minutes spent using toilet) OT Time Calculation (min): 18 min Charges:  OT General Charges $OT Visit: 1 Procedure OT Treatments $Self Care/Home Management : 8-22 mins G-CodesBenito Mccreedy  OTR/L [phone] 12/22/2014, 10:03 AM

## 2014-12-22 NOTE — Care Management Important Message (Signed)
Important Message  Patient Details  Name: Jerry Gilbert MRN: [id number] Date of Birth: 1940-10-09   Medicare Important Message Given:  Yes-third notification given    Nathen May 12/22/2014, 11:30 AM

## 2014-12-22 NOTE — Progress Notes (Signed)
Utilization review completed.  

## 2014-12-23 ENCOUNTER — Telehealth: Payer: Self-pay | Admitting: Vascular Surgery

## 2014-12-23 NOTE — Telephone Encounter (Signed)
-----   Message from Gabriel Earing, [place] sent at 12/22/2014  7:38 AM EDT ----- S/p AAA repair 12/15/14.  F/u with [facility] on 01/01/15.  He will need staple removal also.  Thanks, Aldona Bar

## 2014-12-23 NOTE — Telephone Encounter (Signed)
Spoke with pts wife to schedule, dpm °

## 2014-12-24 ENCOUNTER — Telehealth: Payer: Self-pay

## 2014-12-24 NOTE — Telephone Encounter (Signed)
Phone call from pt's wife.  Reported small amount of incisional drainage with a "sweet odor", noted this morning.   Unable to describe drainage; reported "it was noted on his red shirt, and approx. 2 tsp."  Stated the incision is intact, with some redness along the incision, where the staples pierce the skin.  Reported she doesn't have thermometer, but that "his head felt a little warm last night."   Advised to keep the incision clean and dry; to cover with gauze dressing and change as needed.  Appt. given for 10/13 @ 12:45 PM.  Agrees with plan.

## 2014-12-25 ENCOUNTER — Ambulatory Visit (INDEPENDENT_AMBULATORY_CARE_PROVIDER_SITE_OTHER): Payer: Self-pay | Admitting: Vascular Surgery

## 2014-12-25 ENCOUNTER — Encounter: Payer: Self-pay | Admitting: Vascular Surgery

## 2014-12-25 VITALS — BP 130/79 | HR 68 | Temp 98.3°F | Resp 16 | Wt 163.0 lb

## 2014-12-25 DIAGNOSIS — Z8679 Personal history of other diseases of the circulatory system: Secondary | ICD-10-CM

## 2014-12-25 DIAGNOSIS — Z9889 Other specified postprocedural states: Secondary | ICD-10-CM

## 2014-12-25 NOTE — Progress Notes (Signed)
  POST OPERATIVE OFFICE NOTE    CC:  F/u for surgery  HPI:  This is a 74 y.o. male who is s/p AAA repair on 12/15/14.  His hospital course was complicated by afib, which eventually converted back to NSR.  He was not started on anticoagulation due to hx of GIB and is on aspirin only.  He presents today due to drainage from his incision.  He states that he is not smoking.  He is still coughing.  Pt states his BP was elevated this am since his 3rd dose of lopressor was cut down to bid.  His BP today is 130/80 with HR of 68.    Allergies  Allergen Reactions  . Celecoxib Itching    Itching  . Diclofenac Other (See Comments)    GI bleed  . Statins Diarrhea and Other (See Comments)    Muscle wasting    Current Outpatient Prescriptions  Medication Sig Dispense Refill  . aspirin EC 81 MG tablet Take 1 tablet (81 mg total) by mouth daily. 90 tablet 3  . cloNIDine (CATAPRES) 0.2 MG tablet TAKE ONE TABLET BY MOUTH TWICE DAILY 180 tablet 2  . docusate sodium (COLACE) 100 MG capsule Take 1 capsule (100 mg total) by mouth daily.    [name] lisinopril (PRINIVIL,ZESTRIL) 20 MG tablet Take 1 tablet (20 mg total) by mouth 2 (two) times daily. 180 tablet 3  . metoprolol tartrate (LOPRESSOR) 25 MG tablet Take 1 tablet (25 mg total) by mouth 2 (two) times daily. TAKE ONE TABLET BY MOUTH THREE TIMES DAILY ([place])    . MULTAQ 400 MG tablet TAKE ONE TABLET BY MOUTH TWICE DAILY WITH MEALS 60 tablet 10  . nitroGLYCERIN (NITROSTAT) 0.4 MG SL tablet Place 1 tablet (0.4 mg total) under the tongue every 5 (five) minutes as needed for chest pain. 25 tablet 12  . oxyCODONE-acetaminophen (PERCOCET/ROXICET) 5-325 MG tablet Take 1 tablet by mouth every 4 (four) hours as needed for moderate pain. 30 tablet 0  . pantoprazole (PROTONIX) 40 MG tablet Take 1 tablet (40 mg total) by mouth 2 (two) times daily. 60 tablet 5  . Multiple Vitamins-Minerals (MULTIVITAMIN WITH MINERALS) tablet Take 1 tablet by mouth daily.      No current facility-administered medications for this visit.     ROS:  See HPI  Physical Exam:  Filed Vitals:   12/25/14 1301  BP: 130/79  Pulse: 68  Temp: 98.3 F (36.8 C)  Resp: 16    Incision:  Erythema around staples; there is an area of drainage distal to the umbilicus   Assessment/Plan:  This is a 74 y.o. male who is s/p: AAA repair 12/15/14 by Dr. Oneida Alar  -pt does have an area of drainage distal to the umbilicus.  3 staples were removed today.  There is not purulent drainage and the wound is clean.  -wound packed with dry 2x2 today -wife will perform wet to dry saline dressing changes bid -he will f/u in 1 week at his scheduled appt with Dr. Oneida Alar -encouraged nutrition for wound healing -continue IS and mobilization-encouraged getting outside to enjoy the nice weather. -continue current BB dosing as his BP is good this afternoon and HR in the 60's and regular.  He does have an appt in the Afib clinic on 12/31/14.   Leontine Locket, PA-C [facility] [phone]  Clinic MD:  Pt seen and examined with Dr. Oneida Alar

## 2014-12-30 ENCOUNTER — Encounter: Payer: Self-pay | Admitting: Vascular Surgery

## 2014-12-31 ENCOUNTER — Encounter: Payer: Self-pay | Admitting: Vascular Surgery

## 2014-12-31 ENCOUNTER — Ambulatory Visit (INDEPENDENT_AMBULATORY_CARE_PROVIDER_SITE_OTHER): Payer: Medicare Other | Admitting: Vascular Surgery

## 2014-12-31 ENCOUNTER — Encounter (HOSPITAL_COMMUNITY): Payer: Self-pay | Admitting: Nurse Practitioner

## 2014-12-31 ENCOUNTER — Ambulatory Visit (HOSPITAL_COMMUNITY)
Admission: RE | Admit: 2014-12-31 | Discharge: 2014-12-31 | Disposition: A | Discharge status: Home / Self Care | Payer: Medicare Other | Source: Ambulatory Visit | Attending: Nurse Practitioner | Admitting: Nurse Practitioner

## 2014-12-31 VITALS — BP 130/76 | HR 62 | Ht 68.0 in | Wt 160.2 lb

## 2014-12-31 VITALS — BP 118/74 | HR 75 | Temp 98.7°F | Resp 16 | Ht 68.0 in | Wt 160.0 lb

## 2014-12-31 DIAGNOSIS — I4891 Unspecified atrial fibrillation: Secondary | ICD-10-CM | POA: Insufficient documentation

## 2014-12-31 DIAGNOSIS — I714 Abdominal aortic aneurysm, without rupture, unspecified: Secondary | ICD-10-CM

## 2014-12-31 DIAGNOSIS — I480 Paroxysmal atrial fibrillation: Secondary | ICD-10-CM

## 2014-12-31 MED ORDER — OXYCODONE-ACETAMINOPHEN 5-325 MG PO TABS: 1.0000 | ORAL_TABLET | ORAL | Status: DC | PRN

## 2014-12-31 NOTE — Progress Notes (Signed)
Patient is a 74 year old male who returns for follow-up today. He underwent repair of an infrarenal abdominal aortic aneurysm with suprarenal clamping 2-1/2 weeks ago. He still has marginal appetite and has some overall total deconditioning but is trying to push himself. He's had no further drainage from his abdomen. 2 staples were removed from a small seroma last week. He saw Dr. Jackalyn Lombard physician extender for his irregular atrial rhythm today and said everything checked out okay.  Physical exam:  Filed Vitals:   12/31/14 1356  BP: 118/74  Pulse: 75  Temp: 98.7 F (37.1 C)  TempSrc: Oral  Resp: 16  Height: 5\' 8"  (1.727 m)  Weight: 160 lb (72.576 kg)  SpO2: 96%    Extremities: 2+ femoral pulses feet pink warm bilaterally  Abdomen: Well-healed midline laparotomy incision no evidence of hernia  Assessment: Slowly improving from abdominal aortic aneurysm repair. The patient also has a known left common femoral artery aneurysm. This will need elective repair at some point however he is to deconditioning currently. He will follow-up with me in 6 months time and we will consider whether or not to fix his left common femoral artery aneurysm at that office visit. He will have bilateral lower extremity duplex and ABIs performed at that office visit as well. Given a renewal of his Percocet prescription #30 dispensed today. He should not need any further refills.  Ruta Hinds, MD [facility] Office: [phone] Pager: [phone]

## 2014-12-31 NOTE — Progress Notes (Signed)
Patient ID: Jerry Gilbert, male   DOB: [birth year], 74 y.o.   MRN: [id number]     Primary Care Physician: Wyatt Mage, MD Referring Physician: Dr. Varney Baas is a 74 y.o. male with a h/o of abdominal aortic aneurysm.. The patient has a known aneurysm that has been followed for several years , s/p repair 10/3. The patient denies any back or abdominal pain. He did have a severe GI bleed earlier this year which required 16 units of blood. He has not had any bleeding since then. He is currently on aspirin 81 mg once daily. He is tolerating this. He does smoke and currently does not have any intentions to quit. He denies any chest pain currently. Other medical problems include atrial tachycardia, coronary artery disease, hypertension, history of prostate cancer all of which are currently stable.  Hospital Course:  The patient was admitted to the hospital and taken to the operating room on 12/15/2014 and underwent: Repair of abdominal aortic aneurysm. Intraoperative findings include the following: 18 mm Dacron graft from just below take off of renal arteries to the aortic bifurcation.He pt tolerated the procedure well .  On POD 2, he did have atrial fibrillation and was started on diltiazem and did convert back to NSR, but went back into Afib. A cardiology consult was obtained. He would need long term anticoagulation and Multaq started back when taking po's. Given the pt's hx of GIB, risks vs benefits of anticoagulation were considered. The decision was made for rate control and continued aspirin without anticoagulation due to hx of GIB.   He f/u's in the afib clinic today, now back on multaq, BB,and ASA. He has not had any further irregular heart beat. He is seeing the vascular surgeon today for staple removal.   Today, he denies symptoms of palpitations, chest pain, shortness of breath, orthopnea, PND, lower extremity edema, dizziness, presyncope, syncope, or neurologic sequela.  The patient is tolerating medications without difficulties and is otherwise without complaint today.   Past Medical History  Diagnosis Date  . Atrial tachycardia ([place])   . CAD (coronary artery disease)     S/P PCI LAD  . Hypertension   . Personal history of unspecified circulatory disease     Transient Ischemic attacks  . Prostate cancer ([place])   . Tobacco abuse   . AAA (abdominal aortic aneurysm) ([place]) 11/04/09    3.6 cmx 3.4 cm  . Anemia   . Stroke ([place])   . Myocardial infarction ([place])   . Heart murmur     in 1961  . Kidney stones    Past Surgical History  Procedure Laterality Date  . Bare-metal stent      Left circumflex about 10 years ago  . Percutaneous coronary stent intervention (pci-s)      LAD  . Prostate surgery    . Tonsillectomy    . Cardiac catheterization      11/05/2008  . Abdominal aortic aneurysm repair N/A 12/15/2014    Procedure: ABDOMINAL AORTIC ANEURYSM REPAIR;  Surgeon: Elam Dutch, MD;  Location: [facility];  Service: Vascular;  Laterality: N/A;    Current Outpatient Prescriptions  Medication Sig Dispense Refill  . aspirin EC 81 MG tablet Take 1 tablet (81 mg total) by mouth daily. 90 tablet 3  . cloNIDine (CATAPRES) 0.2 MG tablet TAKE ONE TABLET BY MOUTH TWICE DAILY 180 tablet 2  . docusate sodium (COLACE) 100 MG capsule Take 1 capsule (100 mg total) by  mouth daily.    [name] lisinopril (PRINIVIL,ZESTRIL) 20 MG tablet Take 1 tablet (20 mg total) by mouth 2 (two) times daily. 180 tablet 3  . metoprolol tartrate (LOPRESSOR) 25 MG tablet Take 1 tablet (25 mg total) by mouth 2 (two) times daily. TAKE ONE TABLET BY MOUTH THREE TIMES DAILY ([place])    . MULTAQ 400 MG tablet TAKE ONE TABLET BY MOUTH TWICE DAILY WITH MEALS 60 tablet 10  . Multiple Vitamins-Minerals (MULTIVITAMIN WITH MINERALS) tablet Take 1 tablet by mouth daily.    [name] oxyCODONE-acetaminophen (PERCOCET/ROXICET) 5-325 MG tablet Take 1 tablet by mouth every 4 (four) hours as needed for  moderate pain. 30 tablet 0  . pantoprazole (PROTONIX) 40 MG tablet Take 1 tablet (40 mg total) by mouth 2 (two) times daily. 60 tablet 5  . nitroGLYCERIN (NITROSTAT) 0.4 MG SL tablet Place 1 tablet (0.4 mg total) under the tongue every 5 (five) minutes as needed for chest pain. (Patient not taking: Reported on 12/31/2014) 25 tablet 12   No current facility-administered medications for this encounter.    Allergies  Allergen Reactions  . Celecoxib Itching    Itching  . Diclofenac Other (See Comments)    GI bleed  . Statins Diarrhea and Other (See Comments)    Muscle wasting    Social History   Social History  . Marital Status: Married    Spouse Name: N/A  . Number of Children: N/A  . Years of Education: N/A   Occupational History  . Retired    Social History Main Topics  . Smoking status: Current Every Day Smoker -- 0.10 packs/day for 80 years    Types: Cigarettes  . Smokeless tobacco: Never Used  . Alcohol Use: No     Comment: " No alcohol in over a year."  . Drug Use: No     Comment: No illicit drug use and only takes multivitamin as far as herbal medication goes.  . Sexual Activity: Not on file   Other Topics Concern  . Not on file   Social History Narrative   Lives in [place], [place] with his wife   Remains active, working around his house   Diet is regular     Family History  Problem Relation Age of Onset  . Heart attack Mother 24  . Heart disease Mother     < 86  . Coronary artery disease Brother   . Diabetes Brother   . Heart disease Brother     CABG  . Hyperlipidemia Brother   . Hypertension Brother   . Arrhythmia Brother     S/P cardioversion  . Heart disease Father     ROS- All systems are reviewed and negative except as per the HPI above  Physical Exam: Filed Vitals:   12/31/14 1157  BP: 130/76  Pulse: 62  Height: 5\' 8"  (1.727 m)  Weight: 160 lb 3.2 oz (72.666 kg)    GEN- The patient is well appearing, alert and oriented x 3  today.   Head- normocephalic, atraumatic Eyes-  Sclera clear, conjunctiva pink Ears- hearing intact Oropharynx- clear Neck- supple, no JVP Lymph- no cervical lymphadenopathy Lungs- Clear to ausculation bilaterally, normal work of breathing Heart- Regular rate and rhythm, no murmurs, rubs or gallops, PMI not laterally displaced GI- soft, NT, ND, + BS Extremities- no clubbing, cyanosis, or edema MS- no significant deformity or atrophy Skin- no rash or lesion Psych- euthymic mood, full affect Neuro- strength and sensation are intact  EKG-  NSR, LAD, IRBBB, septal infarct, age undetermined, pr int 120 ms, qrs int 102 ms, qtc 450 ms Epic records reviewed.  Assessment and Plan: 1. Afib Pt states that meds for afib not given during surgery, which may have prompted afib, but now back on  multaq/BB has resolved.  2. Prior GI bleed Continue asa, No DOAC.  F/u with surgeon later today Dr. Rayann Heman  as scheduled in 12/5 afib clinic as needed

## 2015-01-01 ENCOUNTER — Encounter: Payer: Medicare Other | Admitting: Vascular Surgery

## 2015-01-06 NOTE — Addendum Note (Signed)
Addended by: Dorthula Rue L on: 01/06/2015 03:57 PM   Modules accepted: Orders

## 2015-01-09 ENCOUNTER — Telehealth: Payer: Self-pay | Admitting: Internal Medicine

## 2015-01-09 ENCOUNTER — Other Ambulatory Visit: Payer: Self-pay

## 2015-01-09 MED ORDER — DRONEDARONE HCL 400 MG PO TABS: 400.0000 mg | ORAL_TABLET | Freq: Two times a day (BID) | ORAL | Status: DC

## 2015-01-09 NOTE — Telephone Encounter (Signed)
Patient calling the office for samples of medication:   1.  What medication and dosage are you requesting samples for? Multaq  2.  Are you currently out of this medication? Pt has a little bit more but not a lot, 3 or 4 more days  3. Are you requesting samples to get you through until you receive your prescription? No, our office usually just gives them samples whenever he needs them  Pt's wife calling

## 2015-01-13 ENCOUNTER — Telehealth: Payer: Self-pay | Admitting: Internal Medicine

## 2015-01-13 NOTE — Telephone Encounter (Signed)
Follow up from  01/09/15   Patient calling the office for samples of medication:   1. What medication and dosage are you requesting samples for? Multaq  2. Are you currentlyout of this medication? Pt has a little bit more but not a lot, 3 or 4 more days  3. Are you requesting samples to get you through until you receive your prescription? No, our office usually just gives them samples whenever he needs them  Pt's wife calling  Back  Per mindy send another phone message

## 2015-01-13 NOTE — Telephone Encounter (Signed)
Called patient left detailed message on his answering machine stating that we did not have a month's supply of the Multaq is why I refilled the medication for him.  I also suggested that he apply for patient assistance or ask the doctor for a different medication that he can afford.  Patient does have [employer] as well as medicare.

## 2015-02-11 ENCOUNTER — Telehealth: Payer: Self-pay | Admitting: Internal Medicine

## 2015-02-11 NOTE — Telephone Encounter (Signed)
Will move appointment out to March or April  Needs it to be on a Thurs so he can see both Dr Oneida Alar and Dr Rayann Heman on the same day

## 2015-02-11 NOTE — Telephone Encounter (Signed)
New problem   Pt's wife want to know if the appt for 02/16/15 necessary for pt to keep. Please advise.

## 2015-02-16 ENCOUNTER — Telehealth: Payer: Self-pay | Admitting: *Deleted

## 2015-02-16 ENCOUNTER — Telehealth: Payer: Self-pay | Admitting: Internal Medicine

## 2015-02-16 ENCOUNTER — Ambulatory Visit: Payer: Medicare Other | Admitting: Internal Medicine

## 2015-02-16 NOTE — Telephone Encounter (Signed)
New message  Patient calling the office for samples of medication:   1.  What medication and dosage are you requesting samples for?  dronedarone (MULTAQ) 400 MG tablet  2.  Are you currently out of this medication? Yes   Comments: Pt called and asked to have this message routed to the nurse. Will send to [facility] department first. Please assist

## 2015-02-16 NOTE — Telephone Encounter (Signed)
Spoke with patient to let him know that he had samples available. He stated that his brother would be picking them up.

## 2015-02-16 NOTE — Telephone Encounter (Signed)
Jerry Gilbert will try to arrange

## 2015-03-11 ENCOUNTER — Other Ambulatory Visit: Payer: Self-pay | Admitting: *Deleted

## 2015-03-11 MED ORDER — PANTOPRAZOLE SODIUM 40 MG PO TBEC: 40.0000 mg | DELAYED_RELEASE_TABLET | Freq: Two times a day (BID) | ORAL | Status: DC

## 2015-03-12 ENCOUNTER — Telehealth: Payer: Self-pay

## 2015-03-13 DIAGNOSIS — I480 Paroxysmal atrial fibrillation: Secondary | ICD-10-CM | POA: Insufficient documentation

## 2015-03-17 ENCOUNTER — Telehealth: Payer: Self-pay | Admitting: *Deleted

## 2015-03-17 NOTE — Telephone Encounter (Signed)
Spoke with pt, dpm °

## 2015-03-17 NOTE — Telephone Encounter (Signed)
Patient called in to ask to move up his appt with Dr. Oneida Alar in April. His leg has been hurting him more especially with walking. He also states that he has "knots" that come up for a week and then they go down. Our appt desk will contact patient and move up his labs and office visit.   Patient was recently seen by Dr. Vladimir Crofts in [facility] for bloodwork and his annual physical (per Care Everywhere)

## 2015-03-25 NOTE — Telephone Encounter (Signed)
Opened in error

## 2015-04-17 ENCOUNTER — Encounter: Payer: Self-pay | Admitting: Vascular Surgery

## 2015-04-23 ENCOUNTER — Ambulatory Visit (INDEPENDENT_AMBULATORY_CARE_PROVIDER_SITE_OTHER)
Admission: RE | Admit: 2015-04-23 | Discharge: 2015-04-23 | Disposition: A | Discharge status: Home / Self Care | Payer: Medicare Other | Source: Ambulatory Visit | Attending: Vascular Surgery | Admitting: Vascular Surgery

## 2015-04-23 ENCOUNTER — Telehealth: Payer: Self-pay | Admitting: Vascular Surgery

## 2015-04-23 ENCOUNTER — Encounter: Payer: Self-pay | Admitting: Vascular Surgery

## 2015-04-23 ENCOUNTER — Telehealth: Payer: Self-pay | Admitting: Internal Medicine

## 2015-04-23 ENCOUNTER — Ambulatory Visit (INDEPENDENT_AMBULATORY_CARE_PROVIDER_SITE_OTHER): Payer: Medicare Other | Admitting: Vascular Surgery

## 2015-04-23 ENCOUNTER — Ambulatory Visit (HOSPITAL_COMMUNITY)
Admission: RE | Admit: 2015-04-23 | Discharge: 2015-04-23 | Disposition: A | Discharge status: Home / Self Care | Payer: Medicare Other | Source: Ambulatory Visit | Attending: Vascular Surgery | Admitting: Vascular Surgery

## 2015-04-23 VITALS — BP 136/83 | HR 63 | Ht 68.0 in | Wt 177.3 lb

## 2015-04-23 DIAGNOSIS — I724 Aneurysm of artery of lower extremity: Secondary | ICD-10-CM | POA: Diagnosis not present

## 2015-04-23 DIAGNOSIS — I714 Abdominal aortic aneurysm, without rupture, unspecified: Secondary | ICD-10-CM

## 2015-04-23 DIAGNOSIS — I10 Essential (primary) hypertension: Secondary | ICD-10-CM | POA: Insufficient documentation

## 2015-04-23 DIAGNOSIS — R0989 Other specified symptoms and signs involving the circulatory and respiratory systems: Secondary | ICD-10-CM | POA: Diagnosis present

## 2015-04-23 NOTE — Telephone Encounter (Signed)
Will route to Dr Rayann Heman for clearance

## 2015-04-23 NOTE — Telephone Encounter (Signed)
Proceed with surgery if medically indicated without additional cardiac workup.  Pt is at least moderate risk for any procedures.

## 2015-04-23 NOTE — Addendum Note (Signed)
Addended by: Dorthula Rue L on: 04/23/2015 03:04 PM   Modules accepted: Orders

## 2015-04-23 NOTE — Telephone Encounter (Signed)
Spoke with Jerry Gilbert at Dr. Jackalyn Lombard office [phone]. He will check to see if Dr. Rayann Heman could do cardiac clearance without seeing the patient again. Told him pt to return here on 05/14/15 and need to know before then. He verbalized understanding. Will be faxed or put in EPIC.

## 2015-04-23 NOTE — Telephone Encounter (Signed)
Request for surgical clearance:  1. What type of surgery is being performed? Lft fem aneurysm repair  2. When is this surgery scheduled? 3/8  3. Are there any medications that need to be held prior to surgery and how long? n/a   4. Name of physician performing surgery? [facility]  5. What is your office phone and fax number? [phone] [facility] [address].

## 2015-04-23 NOTE — Progress Notes (Signed)
Referring Physician:  History of Present Illness:  Patient is a 75 y.o. year old male who presents for evaluation of s/prepair of an infrarenal abdominal aortic aneurysm with suprarenal clamping.  He  has a known left common femoral artery aneurysm which is is going to discuss repairing in the near future.  He is doing small jobs around the house, but still gets out of breath.  He denise  N/V/D.  Over all he has recovered well from his previous surgery.  Other medical problems include has TOBACCO ABUSE; Hypertensive cardiovascular disease; Coronary atherosclerosis; Atrial tachycardia ([place]); Aortic aneurysm ([place]); UNSPECIFIED PERIPHERAL VASCULAR DISEASE; PROSTATE CANCER, HX OF; TRANSIENT ISCHEMIC ATTACKS, HX OF; Dizziness; Exertional shortness of breath; AAA (abdominal aortic aneurysm) without rupture ([place]); Chest pain on breathing; Femoral artery aneurysm, left ([place]); AAA (abdominal aortic aneurysm) ([place]); Absolute anemia; Anemia due to blood loss; Clinical depression; Breathlessness on exertion; Essential (primary) hypertension; Blood in feces; Hypercholesteremia; Actinic keratoses; CA of prostate ([place]); Polyp of vocal cord and larynx; Borderline diabetes; Gastroduodenal ulcer; Compulsive tobacco user syndrome; Temporary cerebral vascular dysfunction; CAD (coronary artery disease); and Preoperative cardiovascular examination on his problem list.  Past Medical History  Diagnosis Date  . Atrial tachycardia ([place])   . CAD (coronary artery disease)     S/P PCI LAD  . Hypertension   . Personal history of unspecified circulatory disease     Transient Ischemic attacks  . Prostate cancer ([place])   . Tobacco abuse   . AAA (abdominal aortic aneurysm) ([place]) 11/04/09    3.6 cmx 3.4 cm  . Anemia   . Stroke ([place])   . Myocardial infarction ([place])   . Heart murmur     in 1961  . Kidney stones     Past Surgical History  Procedure Laterality Date  . Bare-metal stent      Left circumflex about 10 years  ago  . Percutaneous coronary stent intervention (pci-s)      LAD  . Prostate surgery    . Tonsillectomy    . Cardiac catheterization      11/05/2008  . Abdominal aortic aneurysm repair N/A 12/15/2014    Procedure: ABDOMINAL AORTIC ANEURYSM REPAIR;  Surgeon: Elam Dutch, MD;  Location: [facility] OR;  Service: Vascular;  Laterality: N/A;    Social History Social History  Substance Use Topics  . Smoking status: Current Some Day Smoker -- 0.10 packs/day for 80 years    Types: Cigarettes  . Smokeless tobacco: Never Used  . Alcohol Use: No     Comment: " No alcohol in over a year."    Family History Family History  Problem Relation Age of Onset  . Heart attack Mother 78  . Heart disease Mother     < 5  . Coronary artery disease Brother   . Diabetes Brother   . Heart disease Brother     CABG  . Hyperlipidemia Brother   . Hypertension Brother   . Arrhythmia Brother     S/P cardioversion  . Heart disease Father     Allergies  Allergies  Allergen Reactions  . Celecoxib Itching    Itching  . Diclofenac Other (See Comments)    GI bleed  . Statins Diarrhea and Other (See Comments)    Muscle wasting     Current Outpatient Prescriptions  Medication Sig Dispense Refill  . aspirin EC 81 MG tablet Take 1 tablet (81 mg total) by mouth daily. 90 tablet 3  . cloNIDine (CATAPRES)  0.2 MG tablet TAKE ONE TABLET BY MOUTH TWICE DAILY 180 tablet 2  . docusate sodium (COLACE) 100 MG capsule Take 1 capsule (100 mg total) by mouth daily.    [name] dronedarone (MULTAQ) 400 MG tablet Take 1 tablet (400 mg total) by mouth 2 (two) times daily with a meal. 180 tablet 3  . lisinopril (PRINIVIL,ZESTRIL) 20 MG tablet Take 1 tablet (20 mg total) by mouth 2 (two) times daily. 180 tablet 3  . metoprolol tartrate (LOPRESSOR) 25 MG tablet Take 1 tablet (25 mg total) by mouth 2 (two) times daily. TAKE ONE TABLET BY MOUTH THREE TIMES DAILY ([place])    . Multiple Vitamins-Minerals (MULTIVITAMIN  WITH MINERALS) tablet Take 1 tablet by mouth daily.    . nitroGLYCERIN (NITROSTAT) 0.4 MG SL tablet Place 1 tablet (0.4 mg total) under the tongue every 5 (five) minutes as needed for chest pain. 25 tablet 12  . oxyCODONE-acetaminophen (PERCOCET/ROXICET) 5-325 MG tablet Take 1 tablet by mouth every 4 (four) hours as needed for moderate pain. 30 tablet 0  . pantoprazole (PROTONIX) 40 MG tablet Take 1 tablet (40 mg total) by mouth 2 (two) times daily. 60 tablet 5   No current facility-administered medications for this visit.    ROS:   General:  No weight loss, Fever, chills  HEENT: No recent headaches, no nasal bleeding, no visual changes, no sore throat  Neurologic: No dizziness, blackouts, seizures. No recent symptoms of stroke or mini- stroke. No recent episodes of slurred speech, or temporary blindness.  Cardiac: No recent episodes of chest pain/pressure, no shortness of breath at rest.  No shortness of breath with exertion.  Denies history of atrial fibrillation or irregular heartbeat  Vascular: No history of rest pain in feet.  No history of claudication.  No history of non-healing ulcer, No history of DVT   Pulmonary: No home oxygen, no productive cough, no hemoptysis,  No asthma or wheezing  Musculoskeletal:  [ ]  Arthritis, [ ]  Low back pain,  [ ]  Joint pain  Hematologic:No history of hypercoagulable state.  No history of easy bleeding.  No history of anemia  Gastrointestinal: No hematochezia or melena,  No gastroesophageal reflux, no trouble swallowing  Urinary: [ ]  chronic Kidney disease, [ ]  on HD - [ ]  MWF or [ ]  TTHS, [ ]  Burning with urination, [ ]  Frequent urination, [ ]  Difficulty urinating;   Skin: No rashes  Psychological: No history of anxiety,  No history of depression   Physical Examination  Filed Vitals:   04/23/15 1349 04/23/15 1351  BP: 149/85 136/83  Pulse: 63   Height: 5\' 8"  (1.727 m)   Weight: 177 lb 4.8 oz (80.423 kg)   SpO2: 96%     Body mass  index is 26.96 kg/(m^2).  General:  Alert and oriented, no acute distress HEENT: Normal Neck: No bruit or JVD Pulmonary: Clear to auscultation bilaterally, except right lower lung Jerry Gilbert with wheezing Cardiac: Regular Rate and Rhythm without murmur Abdomen: Soft, non-tender, non-distended, no mass, well healed central abdominal scar, no evidence of hernia. Skin: No rash Extremity Pulses:  2+ radial, brachial, femoral, dorsalis pedis, posterior tibial pulses bilaterally Musculoskeletal: No deformity or edema  Neurologic: Upper and lower extremity motor 5/5 and symmetric  DATA:   Arterial duplex shows left common femoral aneurysmal dilation 2.0 cm diameter triphasic flow arterial system- mural thrombus. No occlusive.  ABI's Right 1.07 bi/tripahsic Left 1.00 bi/tripahsic    ASSESSMENT:   1)S/P 12/15/2014 Infrarenal abdominal  aortic aneurysm with suprarenal   2.)Left common femoral artery aneurysm.    PLAN:   We will orderd a CTA with bilateral R/O to evaluate the AAA repair and review the extent of the femoral artery aneurysm for pre-op planning.  We will plan left femoral aneurysm artery repair for March 8 th by Dr. Oneida Alar.  The CTA will be scheduled for March 2nd.    Theda Sers EMMA [facility] PA-C [facility] [place] Office: [phone]  The patient was in conjunction with Dr. Oneida Alar  History and exam findings as above. Patient has recovered from his abdominal aortic aneurysm repair at this point. 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 I discussed with him the risk of limb loss if his femoral artery occludes. We will begin preparing for reconstruction of his left femoral artery and March. He will come back to see me in a few weeks after repeat CT angiogram with runoff for further evaluation of this.  Ruta Hinds, MD [facility] [place] Office: [phone] Pager:  [phone]

## 2015-04-28 ENCOUNTER — Ambulatory Visit
Admission: RE | Admit: 2015-04-28 | Discharge: 2015-04-28 | Disposition: A | Discharge status: Home / Self Care | Payer: Medicare Other | Source: Ambulatory Visit | Attending: Vascular Surgery | Admitting: Vascular Surgery

## 2015-04-28 DIAGNOSIS — I714 Abdominal aortic aneurysm, without rupture, unspecified: Secondary | ICD-10-CM

## 2015-04-28 DIAGNOSIS — I724 Aneurysm of artery of lower extremity: Secondary | ICD-10-CM

## 2015-04-28 MED ORDER — IOPAMIDOL (ISOVUE-370) INJECTION 76%
100.0000 mL | Freq: Once | INTRAVENOUS | Status: AC | PRN
  Administered 2015-04-28: 100 mL via INTRAVENOUS

## 2015-05-07 ENCOUNTER — Encounter: Payer: Self-pay | Admitting: Vascular Surgery

## 2015-05-14 ENCOUNTER — Ambulatory Visit: Payer: Medicare Other | Admitting: Vascular Surgery

## 2015-05-14 ENCOUNTER — Other Ambulatory Visit: Payer: Self-pay | Admitting: *Deleted

## 2015-05-19 ENCOUNTER — Encounter (HOSPITAL_COMMUNITY)
Admission: RE | Admit: 2015-05-19 | Discharge: 2015-05-19 | Disposition: A | Discharge status: Home / Self Care | Payer: Medicare Other | Source: Ambulatory Visit | Attending: Vascular Surgery | Admitting: Vascular Surgery

## 2015-05-19 ENCOUNTER — Encounter (HOSPITAL_COMMUNITY): Payer: Self-pay

## 2015-05-19 ENCOUNTER — Other Ambulatory Visit: Payer: Self-pay

## 2015-05-19 HISTORY — DX: Cardiac arrhythmia, unspecified: I49.9

## 2015-05-19 HISTORY — DX: Nocturia: R35.1

## 2015-05-19 HISTORY — DX: Other specified cardiac arrhythmias: I49.8

## 2015-05-19 HISTORY — DX: Personal history of other medical treatment: Z92.89

## 2015-05-19 HISTORY — DX: Reserved for inherently not codable concepts without codable children: IMO0001

## 2015-05-19 HISTORY — DX: Personal history of other diseases of the digestive system: Z87.19

## 2015-05-19 LAB — TYPE AND SCREEN
ABO/RH(D): B POS
Antibody Screen: NEGATIVE

## 2015-05-19 LAB — COMPREHENSIVE METABOLIC PANEL
ALT: 16 U/L — AB (ref 17–63)
AST: 17 U/L (ref 15–41)
Albumin: 4 g/dL (ref 3.5–5.0)
Alkaline Phosphatase: 116 U/L (ref 38–126)
Anion gap: 10 (ref 5–15)
BUN: 17 mg/dL (ref 6–20)
CHLORIDE: 104 mmol/L (ref 101–111)
CO2: 22 mmol/L (ref 22–32)
CREATININE: 1.16 mg/dL (ref 0.61–1.24)
Calcium: 9.2 mg/dL (ref 8.9–10.3)
GFR calc non Af Amer: >60 mL/min (ref >60)
Glucose, Bld: 86 mg/dL (ref 65–99)
POTASSIUM: 4.5 mmol/L (ref 3.5–5.1)
SODIUM: 136 mmol/L (ref 135–145)
Total Bilirubin: 0.6 mg/dL (ref 0.3–1.2)
Total Protein: 7.6 g/dL (ref 6.5–8.1)

## 2015-05-19 LAB — PROTIME-INR
INR: 0.96 (ref 0.00–1.49)
PROTHROMBIN TIME: 13 s (ref 11.6–15.2)

## 2015-05-19 LAB — APTT: aPTT: 28 seconds (ref 24–37)

## 2015-05-19 LAB — CBC
HCT: 41.1 % (ref 39.0–52.0)
Hemoglobin: 13 g/dL (ref 13.0–17.0)
MCH: 24.3 pg — AB (ref 26.0–34.0)
MCHC: 31.6 g/dL (ref 30.0–36.0)
MCV: 76.8 fL — ABNORMAL LOW (ref 78.0–100.0)
PLATELETS: 247 10*3/uL (ref 150–400)
RBC: 5.35 MIL/uL (ref 4.22–5.81)
RDW: 18.9 % — AB (ref 11.5–15.5)
WBC: 8.4 10*3/uL (ref 4.0–10.5)

## 2015-05-19 LAB — SURGICAL PCR SCREEN
MRSA, PCR: NEGATIVE
Staphylococcus aureus: NEGATIVE

## 2015-05-19 LAB — URINALYSIS, ROUTINE W REFLEX MICROSCOPIC
BILIRUBIN URINE: NEGATIVE
GLUCOSE, UA: NEGATIVE mg/dL
HGB URINE DIPSTICK: NEGATIVE
KETONES UR: NEGATIVE mg/dL
LEUKOCYTES UA: NEGATIVE
Nitrite: NEGATIVE
PH: 5.5 (ref 5.0–8.0)
PROTEIN: NEGATIVE mg/dL
Specific Gravity, Urine: 1.018 (ref 1.005–1.030)

## 2015-05-19 MED ORDER — DEXTROSE 5 % IV SOLN
1.5000 g | INTRAVENOUS | Status: AC
  Administered 2015-05-20: 1.5 g via INTRAVENOUS
  Filled 2015-05-19: qty 1.5

## 2015-05-19 MED ORDER — SODIUM CHLORIDE 0.9 % IV SOLN: INTRAVENOUS | Status: DC

## 2015-05-19 NOTE — Progress Notes (Signed)
Patient denies recent chest pain, states he has chronic SOB with no changes.  Patient recently had surgery for AAA repair last October and had cardiology clearance from Dr. Rayann Heman then and noted in [facility] as well under notes from 04/23/15.    EKG: 12/31/14 ECHO: 08/19/13 STRESS: 12/10/14 CATH: 11/05/2008  pcp is LAWS, Malachy Chamber, MD   Of note, wife is a retired Therapist, sports from cardiac ICU here at [facility] and very knowledgeable about patient's medical hx.

## 2015-05-19 NOTE — Pre-Procedure Instructions (Signed)
Jerry Gilbert  05/19/2015      [facility] - [place], [place] - [id number] [place] [id number] Phone: [phone] Fax: [phone]    Your procedure is scheduled on Wednesday March 8th..  Report to [facility] Admitting at [phone] A.M.  Call this number if you have problems the morning of surgery:  [phone]   Remember:  Do not eat food or drink liquids after midnight tonight.  Take these medicines the morning of surgery with A SIP OF WATER clonidine (catapres), docusate sodium (colace) if needed, dronedarone (multaq), metoprolol tartrate (lopressor), oxycodone-acetaminophen (percocet) if needed, pantoprazole (protonix)   Do not wear jewelry, make-up or nail polish.  Do not wear lotions, powders, or perfumes.  You may wear deodorant.  Men may shave face and neck.  Do not bring valuables to the hospital.  [facility] is not responsible for any belongings or valuables.  Contacts, dentures or bridgework may not be worn into surgery.  Leave your suitcase in the car.  After surgery it may be brought to your room.  For patients admitted to the hospital, discharge time will be determined by your treatment team.  Patients discharged the day of surgery will not be allowed to drive home.        Preparing for Surgery at [facility]  Before surgery, you can play an important role.  Because skin is not sterile, your skin needs to be as free of germs as possible.  You can reduce the number of germs on your skin by washing with CHG (chlorahexidine gluconate) Soap before surgery.  CHG is an antiseptic cleaner with kills germs and bonds with the skin to continue killing germs even after washing.   Please do not use if you have an allergy to CHG or antibacterial soaps.  If your skin becomes reddened/irritated stop using the CHG.  Do not shave (including legs and underarms) for at least 48 hours prior to first CHG shower.  It is okay to shave your  face.  Please follow these instructions carefully:  1. Shower with CHG Soap the night before surgery and the morning of Surgery. 2. If you choose to wash your hair, wash your hair first as usual with your normal shampoo. 3. After you shampoo, rinse your hair and body thoroughly to remove the Shampoo. 4. Use CHG as you would any other liquid soap. You can apply chg directly to the skin and wash gently with scrungie or a clean washcloth. 5. Apply the CHG Soap to your body ONLY FROM THE NECK DOWN. Do not use on open wounds or open sores. Avoid contact with your eyes, ears, mouth and genitals (private parts). Wash genitals (private parts) with your normal soap. 6. Wash thoroughly, paying special attention to the area where your surgery will be performed. 7. Thoroughly rinse your body with warm water from the neck down. 8. DO NOT shower/wash with your normal soap after using and rinsing off the CHG Soap. 9. Pat yourself dry with a clean towel.  10. Wear clean pajamas.  11. Place clean sheets on your bed the night of your first shower and do not sleep with pets.  Day of Surgery  Do not apply any lotions/deodorants the morning of surgery. Please wear clean clothes to the hospital/surgery center.   Please read over the following fact sheets that you were given. Pain Booklet, Coughing and Deep Breathing, Blood Transfusion Information, MRSA Information and Surgical  Site Infection Prevention

## 2015-05-19 NOTE — Progress Notes (Signed)
Anesthesia Chart Review:  Pt is 75 year old male scheduled for left femoral artery aneurysm repair on 05/20/15 by Dr. Oneida Alar. PAT was at [facility] on 05/19/15.   Cardiologist is Dr. Rayann Heman. Per telephone encounter 04/23/15, "Proceed with surgery if medically indicated without additional cardiac workup. Pt is at least moderate risk for any procedures."  PMH includes: CAD (s/p PCI to LAD), MI, atrial tachycardia, HTN, stroke, TIA, heart murmur, anemia, prostate cancer. 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 Current smoker. BMI 24.80.   Medications includes: amlodopine, ASA, clonidine, lisinopril, metoprolol, Multaq, Protonix, Nitro.   EKG 12/31/14: NSR, LAD, incomplete RBBB, septal infarct (age undetermined).    Nuclear stress test 12/10/2014: -Intermediate risk study.  -Compared to 2013, there appears to a new inferior wall perfusion defect and associated hypokinesis, with mild to moderate reduction in overall systolic function. -While the defect appears mostly fixed, rather than reversible, the presence of severe interference from visceral tracer activity reduces the confidence in this evaluation.  Echo 08/19/2013:  -Global LV systolic function is preserved. EF 63% -RV is normal in size and function -There is aortic valve sclerosis. The aortic valve opens well. There is focal thickening of the non-coronary aortic cusp -Mitral valve is normal in structure and function. No MR, no vegetation see on the mitral valve -No pericardial effusion.   11/10/10 Carotid U/S: 0-39% BICA stenosis.  12/16/14 1V CXR: IMPRESSION: Mild bibasilar atelectasis, with improved aeration of left lung since previous study. No pneumothorax visualized.  Preoperative labs noted.   If no acute changes then it is anticipated that he can proceed as planned.  George Hugh [facility]/Anesthesiology Phone [phone] 05/19/2015 5:09 PM

## 2015-05-20 ENCOUNTER — Inpatient Hospital Stay (HOSPITAL_COMMUNITY)
Admission: RE | Admit: 2015-05-20 | Discharge: 2015-05-21 | DRG: 253 | Disposition: A | Discharge status: Home / Self Care | Payer: Medicare Other | Source: Ambulatory Visit | Attending: Vascular Surgery | Admitting: Vascular Surgery

## 2015-05-20 ENCOUNTER — Inpatient Hospital Stay (HOSPITAL_COMMUNITY): Payer: Medicare Other | Admitting: Certified Registered"

## 2015-05-20 ENCOUNTER — Encounter (HOSPITAL_COMMUNITY)
Admission: RE | Disposition: A | Discharge status: Home / Self Care | Payer: Self-pay | Source: Ambulatory Visit | Attending: Vascular Surgery

## 2015-05-20 ENCOUNTER — Encounter (HOSPITAL_COMMUNITY): Payer: Self-pay | Admitting: Certified Registered"

## 2015-05-20 ENCOUNTER — Inpatient Hospital Stay (HOSPITAL_COMMUNITY): Payer: Medicare Other | Admitting: Vascular Surgery

## 2015-05-20 DIAGNOSIS — Z8546 Personal history of malignant neoplasm of prostate: Secondary | ICD-10-CM | POA: Diagnosis not present

## 2015-05-20 DIAGNOSIS — Z8673 Personal history of transient ischemic attack (TIA), and cerebral infarction without residual deficits: Secondary | ICD-10-CM | POA: Diagnosis not present

## 2015-05-20 DIAGNOSIS — I10 Essential (primary) hypertension: Secondary | ICD-10-CM | POA: Diagnosis present

## 2015-05-20 DIAGNOSIS — D62 Acute posthemorrhagic anemia: Secondary | ICD-10-CM | POA: Diagnosis not present

## 2015-05-20 DIAGNOSIS — I2510 Atherosclerotic heart disease of native coronary artery without angina pectoris: Secondary | ICD-10-CM | POA: Diagnosis present

## 2015-05-20 DIAGNOSIS — F17200 Nicotine dependence, unspecified, uncomplicated: Secondary | ICD-10-CM | POA: Diagnosis present

## 2015-05-20 DIAGNOSIS — I724 Aneurysm of artery of lower extremity: Principal | ICD-10-CM | POA: Diagnosis present

## 2015-05-20 DIAGNOSIS — Z7982 Long term (current) use of aspirin: Secondary | ICD-10-CM | POA: Diagnosis not present

## 2015-05-20 DIAGNOSIS — Z8711 Personal history of peptic ulcer disease: Secondary | ICD-10-CM

## 2015-05-20 DIAGNOSIS — Z955 Presence of coronary angioplasty implant and graft: Secondary | ICD-10-CM | POA: Diagnosis not present

## 2015-05-20 DIAGNOSIS — I252 Old myocardial infarction: Secondary | ICD-10-CM | POA: Diagnosis not present

## 2015-05-20 HISTORY — PX: FEMORAL-POPLITEAL BYPASS GRAFT: SHX937

## 2015-05-20 LAB — CREATININE, SERUM
Creatinine, Ser: 1.05 mg/dL (ref 0.61–1.24)
GFR calc Af Amer: >60 mL/min (ref >60)
GFR calc non Af Amer: >60 mL/min (ref >60)

## 2015-05-20 LAB — CBC
HEMATOCRIT: 35.3 % — AB (ref 39.0–52.0)
Hemoglobin: 11.3 g/dL — ABNORMAL LOW (ref 13.0–17.0)
MCH: 24.8 pg — AB (ref 26.0–34.0)
MCHC: 32 g/dL (ref 30.0–36.0)
MCV: 77.4 fL — AB (ref 78.0–100.0)
PLATELETS: 201 10*3/uL (ref 150–400)
RBC: 4.56 MIL/uL (ref 4.22–5.81)
RDW: 19 % — ABNORMAL HIGH (ref 11.5–15.5)
WBC: 10.2 10*3/uL (ref 4.0–10.5)

## 2015-05-20 SURGERY — BYPASS GRAFT FEMORAL-POPLITEAL ARTERY
Anesthesia: General | Site: Groin | Laterality: Left

## 2015-05-20 MED ORDER — OXYCODONE-ACETAMINOPHEN 5-325 MG PO TABS
1.0000 | ORAL_TABLET | ORAL | Status: DC | PRN
  Administered 2015-05-20 (×2): 1 via ORAL
  Filled 2015-05-20 (×2): qty 1

## 2015-05-20 MED ORDER — GLYCOPYRROLATE 0.2 MG/ML IJ SOLN
INTRAMUSCULAR | Status: AC
  Filled 2015-05-20: qty 2

## 2015-05-20 MED ORDER — LABETALOL HCL 5 MG/ML IV SOLN: 10.0000 mg | INTRAVENOUS | Status: DC | PRN

## 2015-05-20 MED ORDER — 0.9 % SODIUM CHLORIDE (POUR BTL) OPTIME
TOPICAL | Status: DC | PRN
  Administered 2015-05-20: 1000 mL

## 2015-05-20 MED ORDER — POTASSIUM CHLORIDE CRYS ER 20 MEQ PO TBCR
20.0000 meq | EXTENDED_RELEASE_TABLET | Freq: Every day | ORAL | Status: DC | PRN
Start: 2015-05-20 — End: 2015-05-21

## 2015-05-20 MED ORDER — ROCURONIUM BROMIDE 50 MG/5ML IV SOLN
INTRAVENOUS | Status: AC
  Filled 2015-05-20: qty 1

## 2015-05-20 MED ORDER — LACTATED RINGERS IV SOLN
INTRAVENOUS | Status: DC
  Administered 2015-05-20: 10:00:00 via INTRAVENOUS

## 2015-05-20 MED ORDER — SODIUM CHLORIDE 0.9 % IJ SOLN
INTRAMUSCULAR | Status: AC
Start: 2015-05-20 — End: 2015-05-20
  Filled 2015-05-20: qty 10

## 2015-05-20 MED ORDER — ADULT MULTIVITAMIN W/MINERALS CH
1.0000 | ORAL_TABLET | Freq: Every day | ORAL | Status: DC
Start: 2015-05-20 — End: 2015-05-21
  Administered 2015-05-20: 1 via ORAL
  Filled 2015-05-20: qty 1

## 2015-05-20 MED ORDER — HEPARIN SODIUM (PORCINE) 1000 UNIT/ML IJ SOLN
INTRAMUSCULAR | Status: DC | PRN
  Administered 2015-05-20: 8000 [IU] via INTRAVENOUS

## 2015-05-20 MED ORDER — PROTAMINE SULFATE 10 MG/ML IV SOLN
INTRAVENOUS | Status: DC | PRN
  Administered 2015-05-20: 10 mg via INTRAVENOUS
  Administered 2015-05-20: 20 mg via INTRAVENOUS
  Administered 2015-05-20: 10 mg via INTRAVENOUS
  Administered 2015-05-20 (×2): 20 mg via INTRAVENOUS

## 2015-05-20 MED ORDER — LIDOCAINE HCL (CARDIAC) 20 MG/ML IV SOLN
INTRAVENOUS | Status: DC | PRN
  Administered 2015-05-20: 60 mg via INTRAVENOUS

## 2015-05-20 MED ORDER — GUAIFENESIN-DM 100-10 MG/5ML PO SYRP: 15.0000 mL | ORAL_SOLUTION | ORAL | Status: DC | PRN

## 2015-05-20 MED ORDER — CLONIDINE HCL 0.2 MG PO TABS
0.2000 mg | ORAL_TABLET | Freq: Two times a day (BID) | ORAL | Status: DC
  Administered 2015-05-20 – 2015-05-21 (×2): 0.2 mg via ORAL
  Filled 2015-05-20 (×3): qty 1

## 2015-05-20 MED ORDER — ONDANSETRON HCL 4 MG/2ML IJ SOLN: 4.0000 mg | Freq: Four times a day (QID) | INTRAMUSCULAR | Status: DC | PRN

## 2015-05-20 MED ORDER — HYDROMORPHONE HCL 1 MG/ML IJ SOLN
INTRAMUSCULAR | Status: AC
  Filled 2015-05-20: qty 1

## 2015-05-20 MED ORDER — MAGNESIUM HYDROXIDE 400 MG/5ML PO SUSP: 30.0000 mL | Freq: Every day | ORAL | Status: DC | PRN

## 2015-05-20 MED ORDER — FENTANYL CITRATE (PF) 250 MCG/5ML IJ SOLN
INTRAMUSCULAR | Status: DC | PRN
  Administered 2015-05-20 (×2): 100 ug via INTRAVENOUS
  Administered 2015-05-20: 50 ug via INTRAVENOUS

## 2015-05-20 MED ORDER — FENTANYL CITRATE (PF) 250 MCG/5ML IJ SOLN
INTRAMUSCULAR | Status: AC
  Filled 2015-05-20: qty 5

## 2015-05-20 MED ORDER — ACETAMINOPHEN 160 MG/5ML PO SOLN
325.0000 mg | ORAL | Status: DC | PRN
  Filled 2015-05-20: qty 20.3

## 2015-05-20 MED ORDER — SUGAMMADEX SODIUM 200 MG/2ML IV SOLN
INTRAVENOUS | Status: AC
  Filled 2015-05-20: qty 2

## 2015-05-20 MED ORDER — HYDRALAZINE HCL 20 MG/ML IJ SOLN: 5.0000 mg | INTRAMUSCULAR | Status: DC | PRN

## 2015-05-20 MED ORDER — SODIUM CHLORIDE 0.9 % IV SOLN
INTRAVENOUS | Status: DC | PRN
  Administered 2015-05-20: 500 mL

## 2015-05-20 MED ORDER — DOCUSATE SODIUM 100 MG PO CAPS
100.0000 mg | ORAL_CAPSULE | Freq: Every day | ORAL | Status: DC
  Administered 2015-05-21: 100 mg via ORAL
  Filled 2015-05-20: qty 1

## 2015-05-20 MED ORDER — PROPOFOL 10 MG/ML IV BOLUS
INTRAVENOUS | Status: AC
  Filled 2015-05-20: qty 20

## 2015-05-20 MED ORDER — PHENOL 1.4 % MT LIQD: 1.0000 | OROMUCOSAL | Status: DC | PRN

## 2015-05-20 MED ORDER — GLYCOPYRROLATE 0.2 MG/ML IJ SOLN
INTRAMUSCULAR | Status: DC | PRN
  Administered 2015-05-20: 0.2 mg via INTRAVENOUS

## 2015-05-20 MED ORDER — ACETAMINOPHEN 325 MG PO TABS: 325.0000 mg | ORAL_TABLET | ORAL | Status: DC | PRN

## 2015-05-20 MED ORDER — PROTAMINE SULFATE 10 MG/ML IV SOLN
INTRAVENOUS | Status: AC
  Filled 2015-05-20: qty 10

## 2015-05-20 MED ORDER — OXYCODONE HCL 5 MG PO TABS
5.0000 mg | ORAL_TABLET | Freq: Once | ORAL | Status: AC | PRN
  Administered 2015-05-20: 5 mg via ORAL

## 2015-05-20 MED ORDER — SODIUM CHLORIDE 0.9 % IV SOLN
INTRAVENOUS | Status: DC
  Administered 2015-05-20: 22:00:00 via INTRAVENOUS

## 2015-05-20 MED ORDER — DOCUSATE SODIUM 100 MG PO CAPS: 100.0000 mg | ORAL_CAPSULE | Freq: Every day | ORAL | Status: DC

## 2015-05-20 MED ORDER — SUCCINYLCHOLINE CHLORIDE 20 MG/ML IJ SOLN
INTRAMUSCULAR | Status: AC
  Filled 2015-05-20: qty 1

## 2015-05-20 MED ORDER — NITROGLYCERIN 0.4 MG SL SUBL: 0.4000 mg | SUBLINGUAL_TABLET | SUBLINGUAL | Status: DC | PRN

## 2015-05-20 MED ORDER — CHLORHEXIDINE GLUCONATE CLOTH 2 % EX PADS: 6.0000 | MEDICATED_PAD | Freq: Once | CUTANEOUS | Status: DC

## 2015-05-20 MED ORDER — HYDROMORPHONE HCL 1 MG/ML IJ SOLN
0.2500 mg | INTRAMUSCULAR | Status: DC | PRN
  Administered 2015-05-20 (×3): 0.5 mg via INTRAVENOUS

## 2015-05-20 MED ORDER — PROPOFOL 10 MG/ML IV BOLUS
INTRAVENOUS | Status: DC | PRN
  Administered 2015-05-20: 30 mg via INTRAVENOUS
  Administered 2015-05-20: 130 mg via INTRAVENOUS

## 2015-05-20 MED ORDER — OXYCODONE-ACETAMINOPHEN 5-325 MG PO TABS: 1.0000 | ORAL_TABLET | ORAL | Status: DC | PRN

## 2015-05-20 MED ORDER — METOPROLOL TARTRATE 25 MG PO TABS
25.0000 mg | ORAL_TABLET | Freq: Two times a day (BID) | ORAL | Status: DC
  Administered 2015-05-20 – 2015-05-21 (×2): 25 mg via ORAL
  Filled 2015-05-20 (×3): qty 1

## 2015-05-20 MED ORDER — HEPARIN SODIUM (PORCINE) 1000 UNIT/ML IJ SOLN
INTRAMUSCULAR | Status: AC
  Filled 2015-05-20: qty 1

## 2015-05-20 MED ORDER — EPHEDRINE SULFATE 50 MG/ML IJ SOLN
INTRAMUSCULAR | Status: AC
  Filled 2015-05-20: qty 1

## 2015-05-20 MED ORDER — DRONEDARONE HCL 400 MG PO TABS
400.0000 mg | ORAL_TABLET | Freq: Two times a day (BID) | ORAL | Status: DC
Start: 2015-05-20 — End: 2015-05-21
  Administered 2015-05-20 – 2015-05-21 (×2): 400 mg via ORAL
  Filled 2015-05-20 (×3): qty 1

## 2015-05-20 MED ORDER — ONDANSETRON HCL 4 MG/2ML IJ SOLN
INTRAMUSCULAR | Status: DC | PRN
  Administered 2015-05-20: 4 mg via INTRAVENOUS

## 2015-05-20 MED ORDER — PHENYLEPHRINE HCL 10 MG/ML IJ SOLN
10.0000 mg | INTRAVENOUS | Status: DC | PRN
  Administered 2015-05-20: 20 ug/min via INTRAVENOUS

## 2015-05-20 MED ORDER — ROCURONIUM BROMIDE 100 MG/10ML IV SOLN
INTRAVENOUS | Status: DC | PRN
  Administered 2015-05-20: 50 mg via INTRAVENOUS

## 2015-05-20 MED ORDER — DEXTROSE 5 % IV SOLN
1.5000 g | Freq: Two times a day (BID) | INTRAVENOUS | Status: AC
  Administered 2015-05-20 – 2015-05-21 (×2): 1.5 g via INTRAVENOUS
  Filled 2015-05-20 (×2): qty 1.5

## 2015-05-20 MED ORDER — PANTOPRAZOLE SODIUM 40 MG PO TBEC
40.0000 mg | DELAYED_RELEASE_TABLET | Freq: Two times a day (BID) | ORAL | Status: DC
  Administered 2015-05-21: 40 mg via ORAL
  Filled 2015-05-20: qty 1

## 2015-05-20 MED ORDER — ALUM & MAG HYDROXIDE-SIMETH 200-200-20 MG/5ML PO SUSP: 15.0000 mL | ORAL | Status: DC | PRN

## 2015-05-20 MED ORDER — LIDOCAINE HCL (CARDIAC) 20 MG/ML IV SOLN
INTRAVENOUS | Status: AC
  Filled 2015-05-20: qty 5

## 2015-05-20 MED ORDER — OXYCODONE HCL 5 MG/5ML PO SOLN: 5.0000 mg | Freq: Once | ORAL | Status: AC | PRN

## 2015-05-20 MED ORDER — LISINOPRIL 20 MG PO TABS
20.0000 mg | ORAL_TABLET | Freq: Two times a day (BID) | ORAL | Status: DC
  Administered 2015-05-20 – 2015-05-21 (×2): 20 mg via ORAL
  Filled 2015-05-20 (×3): qty 1

## 2015-05-20 MED ORDER — METOPROLOL TARTRATE 1 MG/ML IV SOLN: 2.0000 mg | INTRAVENOUS | Status: DC | PRN

## 2015-05-20 MED ORDER — ACETAMINOPHEN 325 MG RE SUPP
325.0000 mg | RECTAL | Status: DC | PRN
  Filled 2015-05-20: qty 2

## 2015-05-20 MED ORDER — ASPIRIN EC 81 MG PO TBEC
81.0000 mg | DELAYED_RELEASE_TABLET | Freq: Every day | ORAL | Status: DC
  Administered 2015-05-20: 81 mg via ORAL
  Filled 2015-05-20 (×2): qty 1

## 2015-05-20 MED ORDER — OXYCODONE HCL 5 MG PO TABS
ORAL_TABLET | ORAL | Status: AC
  Filled 2015-05-20: qty 1

## 2015-05-20 MED ORDER — ENOXAPARIN SODIUM 30 MG/0.3ML ~~LOC~~ SOLN
30.0000 mg | SUBCUTANEOUS | Status: DC
Start: 2015-05-21 — End: 2015-05-21

## 2015-05-20 MED ORDER — MULTI-VITAMIN/MINERALS PO TABS
1.0000 | ORAL_TABLET | Freq: Every day | ORAL | Status: DC
Start: 2015-05-20 — End: 2015-05-20

## 2015-05-20 MED ORDER — HYDROMORPHONE HCL 1 MG/ML IJ SOLN
INTRAMUSCULAR | Status: AC
  Administered 2015-05-20: 0.5 mg via INTRAVENOUS
  Filled 2015-05-20: qty 1

## 2015-05-20 MED ORDER — ONDANSETRON HCL 4 MG/2ML IJ SOLN
INTRAMUSCULAR | Status: AC
  Filled 2015-05-20: qty 2

## 2015-05-20 MED ORDER — BISACODYL 10 MG RE SUPP: 10.0000 mg | Freq: Every day | RECTAL | Status: DC | PRN

## 2015-05-20 MED ORDER — HYDROMORPHONE HCL 1 MG/ML IJ SOLN: 0.5000 mg | INTRAMUSCULAR | Status: DC | PRN

## 2015-05-20 MED ORDER — SODIUM CHLORIDE 0.9 % IV SOLN
500.0000 mL | Freq: Once | INTRAVENOUS | Status: DC | PRN
Start: 2015-05-20 — End: 2015-05-21

## 2015-05-20 SURGICAL SUPPLY — 53 items
BANDAGE ESMARK 6X9 LF (GAUZE/BANDAGES/DRESSINGS) IMPLANT
BNDG ESMARK 6X9 LF (GAUZE/BANDAGES/DRESSINGS)
CANISTER SUCTION 2500CC (MISCELLANEOUS) ×2 IMPLANT
CANNULA VESSEL 3MM 2 BLNT TIP (CANNULA) ×2 IMPLANT
CLIP TI MEDIUM 24 (CLIP) ×2 IMPLANT
CLIP TI WIDE RED SMALL 24 (CLIP) ×2 IMPLANT
CUFF TOURNIQUET SINGLE 24IN (TOURNIQUET CUFF) IMPLANT
CUFF TOURNIQUET SINGLE 34IN LL (TOURNIQUET CUFF) IMPLANT
CUFF TOURNIQUET SINGLE 44IN (TOURNIQUET CUFF) IMPLANT
DRAIN SNY WOU (WOUND CARE) IMPLANT
DRAPE INCISE IOBAN 66X45 STRL (DRAPES) ×2 IMPLANT
DRAPE PROXIMA HALF (DRAPES) IMPLANT
DRAPE UNIVERSAL (DRAPES) ×2 IMPLANT
DRAPE X-RAY CASS 24X20 (DRAPES) IMPLANT
ELECT REM PT RETURN 9FT ADLT (ELECTROSURGICAL) ×2
ELECTRODE REM PT RTRN 9FT ADLT (ELECTROSURGICAL) ×1 IMPLANT
EVACUATOR SILICONE 100CC (DRAIN) IMPLANT
GAUZE SPONGE 4X4 12PLY STRL (GAUZE/BANDAGES/DRESSINGS) ×2 IMPLANT
GLOVE BIO SURGEON STRL SZ 6.5 (GLOVE) ×8 IMPLANT
GLOVE BIO SURGEON STRL SZ7.5 (GLOVE) ×2 IMPLANT
GOWN STRL REUS W/ TWL LRG LVL3 (GOWN DISPOSABLE) ×2 IMPLANT
GOWN STRL REUS W/TWL LRG LVL3 (GOWN DISPOSABLE) ×2
GOWN STRL REUS W/TWL XL LVL3 (GOWN DISPOSABLE) ×2 IMPLANT
GRAFT HEMASHIELD 8MM (Vascular Products) ×1 IMPLANT
GRAFT VASC STRG 30X8KNIT (Vascular Products) ×1 IMPLANT
KIT BASIN OR (CUSTOM PROCEDURE TRAY) ×2 IMPLANT
KIT ROOM TURNOVER OR (KITS) ×2 IMPLANT
LIQUID BAND (GAUZE/BANDAGES/DRESSINGS) ×2 IMPLANT
NS IRRIG 1000ML POUR BTL (IV SOLUTION) ×4 IMPLANT
PACK PERIPHERAL VASCULAR (CUSTOM PROCEDURE TRAY) ×2 IMPLANT
PAD ARMBOARD 7.5X6 YLW CONV (MISCELLANEOUS) ×4 IMPLANT
PADDING CAST COTTON 6X4 STRL (CAST SUPPLIES) IMPLANT
SET COLLECT BLD 21X3/4 12 (NEEDLE) IMPLANT
SPONGE SURGIFOAM ABS GEL 100 (HEMOSTASIS) IMPLANT
STAPLER VISISTAT 35W (STAPLE) IMPLANT
STOPCOCK 4 WAY LG BORE MALE ST (IV SETS) IMPLANT
SUT PROLENE 5 0 C 1 24 (SUTURE) ×4 IMPLANT
SUT PROLENE 6 0 CC (SUTURE) IMPLANT
SUT PROLENE 7 0 BV 1 (SUTURE) IMPLANT
SUT PROLENE 7 0 BV1 MDA (SUTURE) IMPLANT
SUT SILK 2 0 SH (SUTURE) IMPLANT
SUT SILK 3 0 (SUTURE)
SUT SILK 3-0 18XBRD TIE 12 (SUTURE) IMPLANT
SUT VIC AB 2-0 SH 27 (SUTURE) ×1
SUT VIC AB 2-0 SH 27XBRD (SUTURE) ×1 IMPLANT
SUT VIC AB 3-0 SH 27 (SUTURE) ×2
SUT VIC AB 3-0 SH 27X BRD (SUTURE) ×2 IMPLANT
SUT VIC AB 4-0 PS2 27 (SUTURE) ×2 IMPLANT
TAPE UMBILICAL COTTON 1/8X30 (MISCELLANEOUS) IMPLANT
TRAY FOLEY W/METER SILVER 16FR (SET/KITS/TRAYS/PACK) ×2 IMPLANT
TUBING EXTENTION W/L.L. (IV SETS) IMPLANT
UNDERPAD 30X30 INCONTINENT (UNDERPADS AND DIAPERS) IMPLANT
WATER STERILE IRR 1000ML POUR (IV SOLUTION) ×2 IMPLANT

## 2015-05-20 NOTE — Transfer of Care (Signed)
Immediate Anesthesia Transfer of Care Note  Patient: Jerry Gilbert  Procedure(s) Performed: Procedure(s): LEFT COMMON FEMORAL ARTERY ANEURYSM REPAIR (Left)  Patient Location: PACU  Anesthesia Type:General  Level of Consciousness: awake, alert  and oriented  Airway & Oxygen Therapy: Patient Spontanous Breathing and Patient connected to nasal cannula oxygen  Post-op Assessment: Report given to RN  Post vital signs: Reviewed and stable  Last Vitals:  Filed Vitals:   05/20/15 0955  BP: 166/88  Pulse: 70  Temp: 36.7 C  Resp: 20    Complications: No apparent anesthesia complications

## 2015-05-20 NOTE — Interval H&P Note (Signed)
History and Physical Interval Note:  05/20/2015 9:37 AM  Jerry Gilbert  has presented today for surgery, with the diagnosis of Left femoral artery aneurysm   The various methods of treatment have been discussed with the patient and family. After consideration of risks, benefits and other options for treatment, the patient has consented to  Procedure(s): FEMORAL ARTERY ANEURYSM REPAIR (Left) as a surgical intervention .  The patient's history has been reviewed, patient examined, no change in status, stable for surgery.  I have reviewed the patient's chart and labs.  Questions were answered to the patient's satisfaction.     Ruta Hinds

## 2015-05-20 NOTE — Anesthesia Preprocedure Evaluation (Addendum)
Anesthesia Evaluation  Patient identified by MRN, date of birth, ID band Patient awake    Reviewed: Allergy & Precautions, NPO status , Patient's Chart, lab work & pertinent test results, reviewed documented beta blocker date and time   History of Anesthesia Complications Negative for: history of anesthetic complications  Airway Mallampati: I       Dental  (+) Edentulous Upper, Edentulous Lower, Dental Advisory Given   Pulmonary shortness of breath, Current Smoker,    breath sounds clear to auscultation       Cardiovascular hypertension, Pt. on home beta blockers + CAD, + Past MI and + Peripheral Vascular Disease   Rhythm:Regular     Neuro/Psych    GI/Hepatic PUD,   Endo/Other    Renal/GU      Musculoskeletal   Abdominal   Peds  Hematology   Anesthesia Other Findings   Reproductive/Obstetrics                          Anesthesia Physical Anesthesia Plan  ASA: III  Anesthesia Plan: General   Post-op Pain Management:    Induction: Intravenous  Airway Management Planned: Oral ETT  Additional Equipment:   Intra-op Plan:   Post-operative Plan: Extubation in OR and Possible Post-op intubation/ventilation  Informed Consent:   Plan Discussed with:   Anesthesia Plan Comments:         Anesthesia Quick Evaluation

## 2015-05-20 NOTE — Op Note (Signed)
Procedure: Repair of left common femoral aneurysm  Preoperative diagnosis: Left common femoral aneurysm  Postoperative diagnosis: Same  Anesthesia: Gen.  Assistant: Leontine Locket PA-C  Operative findings: 2.5 to 3 cm left common femoral aneurysm, repaired with 8 mm interposition Dacron graft  Operative details: After painful site, the patient was taken the operating. The patient was placed in supine position operating table. After induction of general anesthesia and endotracheal ablation, patient's entire left lower extremity was prepped and draped in usual sterile fashion. A longitudinal incision was made in the left groin and carried down through the saphenous tissues down to level left common femoral artery. The artery was aneurysmal approximate 2 half to 3 cm in diameter. The distal external iliac artery was dissected free circumferentially just underneath the inguinal ligament. A pair of circumflex iliac branches were ligated and divided between silk ties. The aneurysm was completely mobilized circumferentially. The superficial femoral and profunda femoris arteries were dissected free circumferentially and vessel loops placed around these. There were 2 large profunda branches. The patient was given 8000 units of intravenous heparin. The SFA and profunda were controlled with vessel loops. The external iliac artery was controlled with a Henley clamp. Longitudinal opening was made in the aneurysm and extended towards the femoral bifurcation. Large amount of laminated thrombus was removed. The proximal common femoral artery was transected as well as the distal common femoral artery leaving enough arterial tissue for reconstruction. An 8 mm Dacron graft was then brought up in the operative field and sewn end and to the femoral bifurcation using a running 5-0 Prolene suture. At completion a peripheral DeBakey clamp was placed on the graft and the graft cut to length for anastomosis to the junction of the  external iliac common femoral. This was also done end-to-end with a running 5-0 Prolene suture. Just prior completion anastomosis was forebled backbled and thoroughly flushed. Anastomosis was secured clamps released years pulsatile flow in the SFA and profunda immediately. The patient had dorsalis pedis and posterior tibial Doppler signals which augmented approximately 80% with unclamping the graft. Posterior tibial Doppler signal is more dominant any anterior tibial/dorsalis pedis signal. The patient was given 80 mg protamine. Hemostasis was obtained with direct pressure. The groin was then closed in multiple layers of 2 0 and 3 0 running Vicryl suture. The skin was closed with a 4 0 Vicryl subcuticular stitch. Liquiband was then applied to the groin incision. The patient tolerated the procedure well and there were no complications. Instrument sponge and needle count was correct at the end of the case. The patient was taken to recovery room in stable condition.  Ruta Hinds, MD [facility] [place] Office: [phone] Pager: [phone]

## 2015-05-20 NOTE — Anesthesia Procedure Notes (Signed)
Procedure Name: Intubation Date/Time: 05/20/2015 10:52 AM Performed by: Barrington Ellison Pre-anesthesia Checklist: Patient identified, Emergency Drugs available, Patient being monitored, Suction available and Timeout performed Patient Re-evaluated:Patient Re-evaluated prior to inductionOxygen Delivery Method: Circle system utilized Preoxygenation: Pre-oxygenation with 100% oxygen Intubation Type: IV induction Ventilation: Mask ventilation without difficulty and Oral airway inserted - appropriate to patient size Laryngoscope Size: Mac and 3 Grade View: Grade I Tube type: Oral Tube size: 7.5 mm Number of attempts: 1 Airway Equipment and Method: Stylet Placement Confirmation: ETT inserted through vocal cords under direct vision,  positive ETCO2 and breath sounds checked- equal and bilateral Secured at: 22 cm Tube secured with: Tape Dental Injury: Teeth and Oropharynx as per pre-operative assessment

## 2015-05-20 NOTE — Progress Notes (Signed)
  [facility] Day of [facility] Note  Subjective:  Patient seen in PACU. Says his incision feels sore.   Filed Vitals:   05/20/15 1440 05/20/15 1456  BP: 126/72 133/72  Pulse: 51 51  Temp:    Resp: 17 18    Incisions:  Left groin incision intact. No hematoma.  Extremities:  Audible doppler flow left PT and AT  Assessment/Plan:  This is a 75 y.o. male who is s/p repair of left common femoral aneursym  Good doppler flow to left PT and AT.  Stable post-op. To 3S when bed available.   Virgina Jock, [place] Pager: [phone] 05/20/2015 3:27 PM

## 2015-05-20 NOTE — H&P (View-Only) (Signed)
Referring Physician:  History of Present Illness:  Patient is a 75 y.o. 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  He  has a known left common femoral artery aneurysm which is is going to discuss repairing in the near future.  He is doing small jobs around the house, but still gets out of breath.  He denise  N/V/D.  Over all he has recovered well from his previous surgery.  Other medical problems include has TOBACCO ABUSE; Hypertensive cardiovascular disease; Coronary atherosclerosis; Atrial tachycardia ([place]); Aortic aneurysm ([place]); UNSPECIFIED PERIPHERAL VASCULAR DISEASE; PROSTATE CANCER, HX OF; TRANSIENT ISCHEMIC ATTACKS, HX OF; Dizziness; Exertional shortness of breath; AAA (abdominal aortic aneurysm) without rupture ([place]); Chest pain on breathing; Femoral artery aneurysm, left ([place]); AAA (abdominal aortic aneurysm) ([place]); Absolute anemia; Anemia due to blood loss; Clinical depression; Breathlessness on exertion; Essential (primary) hypertension; Blood in feces; Hypercholesteremia; Actinic keratoses; CA of prostate ([place]); Polyp of vocal cord and larynx; Borderline diabetes; Gastroduodenal ulcer; Compulsive tobacco user syndrome; Temporary cerebral vascular dysfunction; CAD (coronary artery disease); and Preoperative cardiovascular examination on his problem list.  Past Medical History  Diagnosis Date  . Atrial tachycardia ([place])   . CAD (coronary artery disease)     S/P PCI LAD  . Hypertension   . Personal history of unspecified circulatory disease     Transient Ischemic attacks  . Prostate cancer ([place])   . Tobacco abuse   . AAA (abdominal aortic aneurysm) ([place]) 11/04/09    3.6 cmx 3.4 cm  . Anemia   . Stroke ([place])   . Myocardial infarction ([place])   . Heart murmur     in 1961  . Kidney stones     Past Surgical History  Procedure Laterality Date  . Bare-metal stent      Left circumflex about 10 years  ago  . Percutaneous coronary stent intervention (pci-s)      LAD  . Prostate surgery    . Tonsillectomy    . Cardiac catheterization      11/05/2008  . Abdominal aortic aneurysm repair N/A 12/15/2014    Procedure: ABDOMINAL AORTIC ANEURYSM REPAIR;  Surgeon: Elam Dutch, MD;  [facility];  Service: Vascular;  Laterality: N/A;    Social History Social History  Substance Use Topics  . Smoking status: Current Some Day Smoker -- 0.10 packs/day for 80 years    Types: Cigarettes  . Smokeless tobacco: Never Used  . Alcohol Use: No     Comment: " No alcohol in over a year."    Family History Family History  Problem Relation Age of Onset  . Heart attack Mother 51  . Heart disease Mother     < 48  . Coronary artery disease Brother   . Diabetes Brother   . Heart disease Brother     CABG  . Hyperlipidemia Brother   . Hypertension Brother   . Arrhythmia Brother     S/P cardioversion  . Heart disease Father     Allergies  Allergies  Allergen Reactions  . Celecoxib Itching    Itching  . Diclofenac Other (See Comments)    GI bleed  . Statins Diarrhea and Other (See Comments)    Muscle wasting     Current Outpatient Prescriptions  Medication Sig Dispense Refill  . aspirin EC 81 MG tablet Take 1 tablet (81 mg total) by mouth daily. 90 tablet 3  . cloNIDine (CATAPRES)  0.2 MG tablet TAKE ONE TABLET BY MOUTH TWICE DAILY 180 tablet 2  . docusate sodium (COLACE) 100 MG capsule Take 1 capsule (100 mg total) by mouth daily.    [name] dronedarone (MULTAQ) 400 MG tablet Take 1 tablet (400 mg total) by mouth 2 (two) times daily with a meal. 180 tablet 3  . lisinopril (PRINIVIL,ZESTRIL) 20 MG tablet Take 1 tablet (20 mg total) by mouth 2 (two) times daily. 180 tablet 3  . metoprolol tartrate (LOPRESSOR) 25 MG tablet Take 1 tablet (25 mg total) by mouth 2 (two) times daily. TAKE ONE TABLET BY MOUTH THREE TIMES DAILY ([place])    . Multiple Vitamins-Minerals (MULTIVITAMIN  WITH MINERALS) tablet Take 1 tablet by mouth daily.    . nitroGLYCERIN (NITROSTAT) 0.4 MG SL tablet Place 1 tablet (0.4 mg total) under the tongue every 5 (five) minutes as needed for chest pain. 25 tablet 12  . oxyCODONE-acetaminophen (PERCOCET/ROXICET) 5-325 MG tablet Take 1 tablet by mouth every 4 (four) hours as needed for moderate pain. 30 tablet 0  . pantoprazole (PROTONIX) 40 MG tablet Take 1 tablet (40 mg total) by mouth 2 (two) times daily. 60 tablet 5   No current facility-administered medications for this visit.    ROS:   General:  No weight loss, Fever, chills  HEENT: No recent headaches, no nasal bleeding, no visual changes, no sore throat  Neurologic: No dizziness, blackouts, seizures. No recent symptoms of stroke or mini- stroke. No recent episodes of slurred speech, or temporary blindness.  Cardiac: No recent episodes of chest pain/pressure, no shortness of breath at rest.  No shortness of breath with exertion.  Denies history of atrial fibrillation or irregular heartbeat  Vascular: No history of rest pain in feet.  No history of claudication.  No history of non-healing ulcer, No history of DVT   Pulmonary: No home oxygen, no productive cough, no hemoptysis,  No asthma or wheezing  Musculoskeletal:  [ ]  Arthritis, [ ]  Low back pain,  [ ]  Joint pain  Hematologic:No history of hypercoagulable state.  No history of easy bleeding.  No history of anemia  Gastrointestinal: No hematochezia or melena,  No gastroesophageal reflux, no trouble swallowing  Urinary: [ ]  chronic Kidney disease, [ ]  on HD - [ ]  MWF or [ ]  TTHS, [ ]  Burning with urination, [ ]  Frequent urination, [ ]  Difficulty urinating;   Skin: No rashes  Psychological: No history of anxiety,  No history of depression   Physical Examination  Filed Vitals:   04/23/15 1349 04/23/15 1351  BP: 149/85 136/83  Pulse: 63   Height: 5\' 8"  (1.727 m)   Weight: 177 lb 4.8 oz (80.423 kg)   SpO2: 96%     Body mass  index is Jerry.96 kg/(m^2).  General:  Alert and oriented, no acute distress HEENT: Normal Neck: No bruit or JVD Pulmonary: Clear to auscultation bilaterally, except right lower lung fields with wheezing Cardiac: Regular Rate and Rhythm without murmur Abdomen: Soft, non-tender, non-distended, no mass, well healed central abdominal scar, no evidence of hernia. Skin: No rash Extremity Pulses:  2+ radial, brachial, femoral, dorsalis pedis, posterior tibial pulses bilaterally Musculoskeletal: No deformity or edema  Neurologic: Upper and lower extremity motor 5/5 and symmetric  DATA:   Arterial duplex shows left common femoral aneurysmal dilation 2.0 cm diameter triphasic flow arterial system- mural thrombus. No occlusive.  ABI's Right 1.07 bi/tripahsic Left 1.00 bi/tripahsic    ASSESSMENT:   1)S/P 12/15/2014 Infrarenal abdominal  aortic aneurysm with suprarenal   2.)Left common femoral artery aneurysm.    PLAN:   We will orderd a CTA with bilateral R/O to evaluate the AAA repair and review the extent of the femoral artery aneurysm for pre-op planning.  We will plan left femoral aneurysm artery repair for March 8 th by Dr. Oneida Alar.  The CTA will be scheduled for March 2nd.    Theda Sers EMMA [facility] PA-C [facility] [place] Office: [phone]  The patient was in conjunction with Dr. Oneida Alar  History and exam findings as above. Patient has recovered from his abdominal aortic aneurysm repair at this point. 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 I discussed with him the risk of limb loss if his femoral artery occludes. We will begin preparing for reconstruction of his left femoral artery and March. He will come back to see me in a few weeks after repeat CT angiogram with runoff for further evaluation of this.  Ruta Hinds, MD [facility] [place] Office: [phone] Pager:  [phone]

## 2015-05-20 NOTE — Anesthesia Postprocedure Evaluation (Signed)
Anesthesia Post Note  Patient: Jerry Gilbert  Procedure(s) Performed: Procedure(s) (LRB): LEFT COMMON FEMORAL ARTERY ANEURYSM REPAIR (Left)  Patient location during evaluation: PACU Anesthesia Type: General Level of consciousness: awake Pain management: pain level controlled Vital Signs Assessment: post-procedure vital signs reviewed and stable Respiratory status: spontaneous breathing Cardiovascular status: stable Postop Assessment: no signs of nausea or vomiting Anesthetic complications: no    Last Vitals:  Filed Vitals:   05/20/15 1615 05/20/15 1630  BP:  159/88  Pulse: 46 50  Temp:    Resp: 16 15    Last Pain:  Filed Vitals:   05/20/15 1632  PainSc: 8                  Davison Ohms

## 2015-05-21 ENCOUNTER — Telehealth: Payer: Self-pay | Admitting: Vascular Surgery

## 2015-05-21 ENCOUNTER — Inpatient Hospital Stay (HOSPITAL_COMMUNITY): Payer: Medicare Other

## 2015-05-21 ENCOUNTER — Encounter (HOSPITAL_COMMUNITY): Payer: Self-pay | Admitting: Vascular Surgery

## 2015-05-21 LAB — CBC
HEMATOCRIT: 33 % — AB (ref 39.0–52.0)
HEMOGLOBIN: 10.6 g/dL — AB (ref 13.0–17.0)
MCH: 25.1 pg — ABNORMAL LOW (ref 26.0–34.0)
MCHC: 32.1 g/dL (ref 30.0–36.0)
MCV: 78.2 fL (ref 78.0–100.0)
Platelets: 183 10*3/uL (ref 150–400)
RBC: 4.22 MIL/uL (ref 4.22–5.81)
RDW: 19.3 % — ABNORMAL HIGH (ref 11.5–15.5)
WBC: 9.2 10*3/uL (ref 4.0–10.5)

## 2015-05-21 LAB — BASIC METABOLIC PANEL
ANION GAP: 9 (ref 5–15)
BUN: 16 mg/dL (ref 6–20)
CHLORIDE: 103 mmol/L (ref 101–111)
CO2: 24 mmol/L (ref 22–32)
Calcium: 8.1 mg/dL — ABNORMAL LOW (ref 8.9–10.3)
Creatinine, Ser: 1.06 mg/dL (ref 0.61–1.24)
GFR calc non Af Amer: >60 mL/min (ref >60)
Glucose, Bld: 116 mg/dL — ABNORMAL HIGH (ref 65–99)
Potassium: 4.3 mmol/L (ref 3.5–5.1)
SODIUM: 136 mmol/L (ref 135–145)

## 2015-05-21 NOTE — Progress Notes (Signed)
Discussed and explained discharge instruction, f/u appt and prescription given to patient. Gave 4x4 gauze and tape for incision care. Instructions given to patient to gently wash with soap and water and pat dry. Then to cover incision with dry gauze. Patient going home with wife via w/c.

## 2015-05-21 NOTE — Telephone Encounter (Signed)
notified patient of fu appt. on 06-03-15 at 10 a.m. with dr. Oneida Alar

## 2015-05-21 NOTE — Care Management Note (Signed)
Case Management Note  Patient Details  Name: Jerry Gilbert MRN: [id number] Date of Birth: 10-03-1940  Subjective/Objective:   Patient is from home with spouse, pta indep, per pt eval no pt needs.                  Action/Plan:   Expected Discharge Date:  05/21/15               Expected Discharge Plan:  Home/Self Care  In-House Referral:     Discharge planning Services  CM Consult  Post Acute Care Choice:    Choice offered to:     DME Arranged:    DME Agency:     HH Arranged:    HH Agency:     Status of Service:  Completed, signed off  Medicare Important Message Given:    Date Medicare IM Given:    Medicare IM give by:    Date Additional Medicare IM Given:    Additional Medicare Important Message give by:     If discussed at [place] of Stay Meetings, dates discussed:    Additional Comments:  Zenon Mayo, RN 05/21/2015, 11:20 AM

## 2015-05-21 NOTE — Evaluation (Signed)
Physical Therapy Evaluation Patient Details Name: Jerry Gilbert MRN: [id number] DOB: Mar 17, 1940 Today's Date: 05/21/2015   History of Present Illness  Pt is a 75 y/o M s/p repair of Lt femoral artery aneurysm.  Pt's PMH includes prostate cancer, tobacco abuse, CAD, atrial tachycardia, anemia, stroke, MI, AAA and repair.  Clinical Impression  Patient is s/p above surgery resulting in functional limitations due to the deficits listed below (see PT Problem List). Jerry Gilbert was Ind PTA, currently requires supervision for safety.  Pt completed stair training and ambulated 300 ft in hallway.  He will have 24/7 assist available from his wife at d/c. Patient will benefit from skilled PT to increase their independence and safety with mobility to allow discharge to the venue listed below.       Follow Up Recommendations No PT follow up;Supervision for mobility/OOB    Equipment Recommendations  None recommended by PT    Recommendations for Other Services       Precautions / Restrictions Precautions Precautions: Fall Restrictions Weight Bearing Restrictions: No      Mobility  Bed Mobility               General bed mobility comments: Pt sitting in recliner chair upon PT arrival  Transfers Overall transfer level: Needs assistance Equipment used: None Transfers: Sit to/from Stand Sit to Stand: Supervision         General transfer comment: No cues or physical assist required.  Supervision for safety.  Ambulation/Gait Ambulation/Gait assistance: Supervision Ambulation Distance (Feet): 300 Feet Assistive device: None Gait Pattern/deviations: Step-through pattern;Decreased stride length;Decreased weight shift to left   Gait velocity interpretation: Below normal speed for age/gender General Gait Details: No instability noted w/ high level balance activities as documented below. Supervision for safety.  SpO2 down to 89% on RA but not sustained.  Remains in low 90's throughout most  of session.  Stairs Stairs: Yes Stairs assistance: Supervision Stair Management: One rail Left;Step to pattern;Forwards Number of Stairs: 10 General stair comments: No cues or physical assist needed.  Supervision for safety.  Wheelchair Mobility    Modified Rankin (Stroke Patients Only)       Balance Overall balance assessment: Needs assistance Sitting-balance support: No upper extremity supported;Feet supported Sitting balance-Leahy Scale: Good     Standing balance support: No upper extremity supported;During functional activity Standing balance-Leahy Scale: Good               High level balance activites: Backward walking;Direction changes;Turns;Sudden stops;Head turns High Level Balance Comments: No instability noted w/ high level balance activities             Pertinent Vitals/Pain Pain Assessment: 0-10 Pain Score: 5  Pain Location: incision site Pain Descriptors / Indicators: Operative site guarding;Aching Pain Intervention(s): Limited activity within patient's tolerance;Monitored during session    Home Living Family/patient expects to be discharged to:: Private residence Living Arrangements: Spouse/significant other Available Help at Discharge: Family;Available 24 hours/day Type of Home: Other(Comment) (RV) Home Access: Stairs to enter Entrance Stairs-Rails: Left Entrance Stairs-Number of Steps: 4 Home Layout: One level Home Equipment: Cane - single point Additional Comments: Pt and wife are planning to stay at hotel tonight as they have a 2 hour drive home.  They will be staying on the seconds floor and pt will have to take the stairs as there is no elevator.    Prior Function Level of Independence: Independent               Hand  Dominance        Extremity/Trunk Assessment   Upper Extremity Assessment: Overall WFL for tasks assessed           Lower Extremity Assessment: LLE deficits/detail   LLE Deficits / Details: limited  strength as expected s/p surgery listed above     Communication   Communication: No difficulties  Cognition Arousal/Alertness: Awake/alert Behavior During Therapy: Flat affect Overall Cognitive Status: Within Functional Limits for tasks assessed                      General Comments      Exercises General Exercises - Lower Extremity Ankle Circles/Pumps: AROM;Both;10 reps;Seated Long Arc Quad: AROM;Both;10 reps;Seated      Assessment/Plan    PT Assessment Patient needs continued PT services  PT Diagnosis Difficulty walking;Acute pain   PT Problem List Decreased strength;Decreased range of motion;Decreased activity tolerance;Decreased balance;Decreased safety awareness;Pain  PT Treatment Interventions DME instruction;Gait training;Stair training;Functional mobility training;Therapeutic activities;Therapeutic exercise;Balance training;Patient/family education;Modalities   PT Goals (Current goals can be found in the Care Plan section) Acute Rehab PT Goals Patient Stated Goal: to go home today PT Goal Formulation: With patient/family Time For Goal Achievement: 05/28/15 Potential to Achieve Goals: Good    Frequency Min 3X/week   Barriers to discharge Inaccessible home environment steps to enter RV     Co-evaluation               End of Session Equipment Utilized During Treatment: Gait belt Activity Tolerance: Patient tolerated treatment well Patient left: in chair;with call bell/phone within reach;with family/visitor present Nurse Communication: Mobility status;Other (comment) (SpO2)         Time: [id number] PT Time Calculation (min) (ACUTE ONLY): 23 min   Charges:   PT Evaluation $PT Eval Low Complexity: 1 Procedure PT Treatments $Gait Training: 8-22 mins   PT G Codes:       Collie Siad PT, DPT  Pager: [phone] Phone: [phone] 05/21/2015, 10:06 AM

## 2015-05-21 NOTE — Progress Notes (Addendum)
  [facility] Progress Note  Subjective  - POD #1  Ready to go home.   Objective Filed Vitals:   05/21/15 0059 05/21/15 0426  BP: 123/73 121/70  Pulse: 61 55  Temp: 99.1 F (37.3 C) 99.3 F (37.4 C)  Resp: 16 18    Intake/Output Summary (Last 24 hours) at 05/21/15 0721 Last data filed at 05/21/15 0700  Gross per 24 hour  Intake   1450 ml  Output    925 ml  Net    525 ml   Left groin incision clean and dry Brisk doppler flow left PT. Monophasic left DP.   Assessment/Planning: 75 y.o. male is s/p: left common femoral artery aneurysm repair 1 Day Post-Op   ABLA tolerating. Doing well post op. Needs to ambulate and void. Discharge home today.  Counseled on smoking cessation.   Alvia Grove 05/21/2015 7:21 AM -- Emphasize dry gauze left groin D/c home today. Groin incision no hematoma Palpable pedal pulses  Ruta Hinds, MD [facility] [place]: [phone] Pager: [phone]  Laboratory CBC    Component Value Date/Time   WBC 9.2 05/21/2015 0445   HGB 10.6* 05/21/2015 0445   HCT 33.0* 05/21/2015 0445   PLT 183 05/21/2015 0445    BMET    Component Value Date/Time   NA 136 05/21/2015 0445   K 4.3 05/21/2015 0445   CL 103 05/21/2015 0445   CO2 24 05/21/2015 0445   GLUCOSE 116* 05/21/2015 0445   BUN 16 05/21/2015 0445   CREATININE 1.06 05/21/2015 0445   CALCIUM 8.1* 05/21/2015 0445   GFRNONAA >60 05/21/2015 0445   GFRAA >60 05/21/2015 0445    COAG Lab Results  Component Value Date   INR 0.96 05/19/2015   INR 1.35 12/15/2014   INR 1.01 12/08/2014   No results found for: PTT  Antibiotics Anti-infectives    Start     Dose/Rate Route Frequency Ordered Stop   05/20/15 2200  cefUROXime (ZINACEF) 1.5 g in dextrose 5 % 50 mL IVPB     1.5 g 100 mL/hr over 30 Minutes Intravenous Every 12 hours 05/20/15 1334 05/21/15 2159   05/20/15 1230  cefUROXime (ZINACEF) 1.5 g in dextrose 5 % 50 mL IVPB      1.5 g 100 mL/hr over 30 Minutes Intravenous To [facility] 05/19/15 0907 05/20/15 [place], PA-C [facility] Office: [phone] Pager: [phone] 05/21/2015 7:21 AM

## 2015-05-25 ENCOUNTER — Telehealth: Payer: Self-pay

## 2015-05-25 NOTE — Telephone Encounter (Signed)
Pt's wife called to report pt. Having constipation for approx. 5 days.  Stated he took a stool softener the past 3 days, and took one dose of Miralax yesterday.  Reported abdominal bloating and able to pass gas.  Wife stated that his abdomen is soft.  Denied any nausea or vomiting.  Does c/o intermittent abdominal discomfort, and feeling like the stool is moving down.  Encouraged to increase walking; to try to increase fluid intake, and to take MOM 1 oz, this evening.  Wife stated she just bought Ex Lax, and asked if he could take it.  Advised to take one dose this evening, and if no results to repeat in AM.  Encouraged to call office if no results.  Wife also reported a quarter-sized bulge on the left groin incision.  Reported it to be approx. 1/4" raised.  Pt. stated it is sore; denied any redness, warmth or drainage from the incision.  Reported the incision is intact.  Advised to monitor for increasing size of the bulge, redness/ inflammation, fever/ chills.  Verb. Understanding.

## 2015-05-26 NOTE — Discharge Summary (Signed)
[facility] Discharge Summary  Jerry Gilbert [birth year] 75 y.o. male  [id number]  Admission Date: 05/20/2015  Discharge Date: 05/21/2015  Physician: Ruta Hinds, MD  Admission Diagnosis: Left femoral artery aneurysm   HPI:   This is a 75 y.o. 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 He has a known left common femoral artery aneurysm.  He is doing small jobs around the house, but still gets out of breath. He denise N/V/D. Over all he has recovered well from his previous surgery. Other medical problems include has hypertension; CAD; atrial tachycardia and tobacco abuse.    Hospital Course:  The patient was admitted to the hospital and taken to the operating room on 05/20/2015 and underwent: Repair of left common femoral aneurysm  The patient tolerated the procedure well and was transported to the PACU in stable condition.   POD 1: The patient was doing well on postop day 1. He was tolerating his acute blood loss anemia. His groin incision was intact without hematoma. He had palpable pedal pulses. His pain was well controlled and he was ambulating adequately. He was discharged home on postop day 1 in good condition.    CBC    Component Value Date/Time   WBC 9.2 05/21/2015 0445   RBC 4.22 05/21/2015 0445   HGB 10.6* 05/21/2015 0445   HCT 33.0* 05/21/2015 0445   PLT 183 05/21/2015 0445   MCV 78.2 05/21/2015 0445   MCH 25.1* 05/21/2015 0445   MCHC 32.1 05/21/2015 0445   RDW 19.3* 05/21/2015 0445   LYMPHSABS 1.5 09/22/2010 1445   MONOABS 0.5 09/22/2010 1445   EOSABS 0.1 09/22/2010 1445   BASOSABS 0.0 09/22/2010 1445    BMET    Component Value Date/Time   NA 136 05/21/2015 0445   K 4.3 05/21/2015 0445   CL 103 05/21/2015 0445   CO2 24 05/21/2015 0445   GLUCOSE 116* 05/21/2015 0445   BUN 16 05/21/2015 0445   CREATININE 1.06 05/21/2015 0445   CALCIUM 8.1* 05/21/2015  0445   GFRNONAA >60 05/21/2015 0445   GFRAA >60 05/21/2015 0445     Discharge Instructions:   The patient is discharged to home with extensive instructions on wound care and progressive ambulation.  They are instructed not to drive or perform any heavy lifting until returning to see the physician in his office.  Discharge Instructions    Call MD for:  redness, tenderness, or signs of infection (pain, swelling, bleeding, redness, odor or green/yellow discharge around incision site)    Complete by:  As directed      Call MD for:  severe or increased pain, loss or decreased feeling  in affected limb(s)    Complete by:  As directed      Call MD for:  temperature >100.5    Complete by:  As directed      Discharge wound care:    Complete by:  As directed   Wash the groin wound with soap and water daily and pat dry. (No tub bath-only shower)  Then put a dry gauze or washcloth there to keep this area dry daily and as needed.  Do not use Vaseline or neosporin on your incisions.  Only use soap and water on your incisions and then protect and keep dry.     Driving Restrictions    Complete by:  As directed   No driving for 2 weeks     Lifting  restrictions    Complete by:  As directed   No lifting for 4 weeks     Resume previous diet    Complete by:  As directed            Discharge Diagnosis:  Left femoral artery aneurysm   Secondary Diagnosis: Patient Active Problem List   Diagnosis Date Noted  . Femoral artery aneurysm ([place]) 05/20/2015  . CAD (coronary artery disease) 12/08/2014  . Preoperative cardiovascular examination 12/08/2014  . Femoral artery aneurysm, left ([place]) 11/27/2014  . Chest pain on breathing 11/20/2014  . AAA (abdominal aortic aneurysm) without rupture ([place]) 11/06/2013  . AAA (abdominal aortic aneurysm) ([place]) 08/29/2013  . Anemia due to blood loss 08/29/2013  . Gastroduodenal ulcer 08/29/2013  . Dizziness 11/30/2011  . Exertional shortness of breath 11/30/2011  .  Aortic aneurysm ([place]) 11/03/2009  . UNSPECIFIED PERIPHERAL VASCULAR DISEASE 11/03/2009  . TOBACCO ABUSE 12/02/2008  . 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 Coronary atherosclerosis 12/02/2008  . Atrial tachycardia ([place]) 12/02/2008  . PROSTATE CANCER, HX OF 12/02/2008  . TRANSIENT ISCHEMIC ATTACKS, HX OF 12/02/2008  . 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 Absolute anemia 02/27/2007  . Borderline diabetes 02/27/2007  . Breathlessness on exertion 02/20/2007  . Hypercholesteremia 07/12/2006  . Actinic keratoses 06/15/2006  . CA of prostate ([place]) 06/15/2006  . Blood in feces 05/11/2006  . 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 Essential (primary) hypertension 03/16/2006  . Compulsive tobacco user syndrome 03/16/2006  . Temporary cerebral vascular dysfunction 03/16/2006   Past Medical History  Diagnosis Date  . Atrial tachycardia ([place])   . CAD (coronary artery disease)     S/P PCI LAD  . Hypertension   . Personal history of unspecified circulatory disease     Transient Ischemic attacks  . Prostate cancer ([place])   . Tobacco abuse   . AAA (abdominal aortic aneurysm) ([place]) 11/04/09    3.6 cmx 3.4 cm  . Anemia   . Myocardial infarction ([place])   . Heart murmur     in 1961  . Kidney stones   . Dysrhythmia   . Chaotic atrial rhythm   . Stroke [facility])     "small TIA"  . Shortness of breath dyspnea     'with minimal exertion'  . Nocturia   . H/O transfusion of packed red blood cells     16 units of blood in 2014 d/t GI bleed  . H/O: GI bleed        Medication List    TAKE these medications        aspirin EC 81 MG tablet  Take 1 tablet (81 mg total) by mouth daily.     cloNIDine 0.2 MG tablet  Commonly known as:  CATAPRES  TAKE ONE TABLET BY MOUTH TWICE DAILY     docusate sodium 100 MG capsule  Commonly known as:  COLACE  Take 1 capsule (100 mg total) by mouth daily.     dronedarone 400 MG tablet  Commonly known as:  MULTAQ  Take 1 tablet (400 mg total)  by mouth 2 (two) times daily with a meal.     lisinopril 20 MG tablet  Commonly known as:  PRINIVIL,ZESTRIL  Take 1 tablet (20 mg total) by mouth 2 (two) times daily.     metoprolol tartrate 25 MG tablet  Commonly known as:  LOPRESSOR  Take 1 tablet (25 mg total) by mouth 2 (two) times daily. TAKE ONE TABLET BY MOUTH THREE TIMES  DAILY (GENERIC FOR LOPRESSOR)     multivitamin with minerals tablet  Take 1 tablet by mouth daily.     nitroGLYCERIN 0.4 MG SL tablet  Commonly known as:  NITROSTAT  Place 1 tablet (0.4 mg total) under the tongue every 5 (five) minutes as needed for chest pain.     oxyCODONE-acetaminophen 5-325 MG tablet  Commonly known as:  PERCOCET/ROXICET  Take 1 tablet by mouth every 4 (four) hours as needed for moderate pain.     pantoprazole 40 MG tablet  Commonly known as:  PROTONIX  Take 1 tablet (40 mg total) by mouth 2 (two) times daily.        Percocet #30 No Refill  Disposition: Home  Patient's condition: is Good  Follow up: 1. Dr. Oneida Alar in 2 weeks   Virgina Jock, PA-C [facility] [phone] 05/26/2015  1:39 PM  - For VQI Registry use --- Instructions: Press F2 to tab through selections.  Delete question if not applicable.   Post-op:  Wound infection: No  Graft infection: No  Transfusion: No   New Arrhythmia: No Ipsilateral amputation: No, [ ]  Minor, [ ]  BKA, [ ]  AKA D/C Ambulatory Status: Ambulatory with Assistance  Complications: MI: No, [ ]  Troponin only, [ ]  EKG or Clinical CHF: No Resp failure:No, [ ]  Pneumonia, [ ]  Ventilator Chg in renal function: No, [ ]  Inc. Cr > 0.5, [ ]  Temp. Dialysis, [ ]  Permanent dialysis Stroke: No, [ ]  Minor, [ ]  Major Return to OR: No  Reason for return to OR: [ ]  Bleeding, [ ]  Infection, [ ]  Thrombosis, [ ]  Revision  Discharge medications: Statin use:  No, intolerant of statins ASA use:  yes Plavix use:  no Beta blocker use: yes Coumadin use: no

## 2015-05-27 ENCOUNTER — Encounter: Payer: Self-pay | Admitting: Vascular Surgery

## 2015-06-03 ENCOUNTER — Encounter: Payer: Self-pay | Admitting: Vascular Surgery

## 2015-06-03 ENCOUNTER — Ambulatory Visit (INDEPENDENT_AMBULATORY_CARE_PROVIDER_SITE_OTHER): Payer: Medicare Other | Admitting: Vascular Surgery

## 2015-06-03 ENCOUNTER — Encounter: Payer: Self-pay | Admitting: Internal Medicine

## 2015-06-03 ENCOUNTER — Ambulatory Visit (INDEPENDENT_AMBULATORY_CARE_PROVIDER_SITE_OTHER): Payer: Medicare Other | Admitting: Internal Medicine

## 2015-06-03 VITALS — BP 164/80 | HR 70 | Ht 71.0 in | Wt 179.0 lb

## 2015-06-03 VITALS — BP 141/92 | HR 64 | Temp 97.1°F | Resp 18 | Ht 71.0 in | Wt 175.0 lb

## 2015-06-03 DIAGNOSIS — I714 Abdominal aortic aneurysm, without rupture, unspecified: Secondary | ICD-10-CM

## 2015-06-03 DIAGNOSIS — I4719 Other supraventricular tachycardia: Secondary | ICD-10-CM

## 2015-06-03 DIAGNOSIS — I471 Supraventricular tachycardia: Secondary | ICD-10-CM

## 2015-06-03 DIAGNOSIS — I119 Hypertensive heart disease without heart failure: Secondary | ICD-10-CM

## 2015-06-03 DIAGNOSIS — I25118 Atherosclerotic heart disease of native coronary artery with other forms of angina pectoris: Secondary | ICD-10-CM | POA: Diagnosis not present

## 2015-06-03 DIAGNOSIS — I480 Paroxysmal atrial fibrillation: Secondary | ICD-10-CM | POA: Diagnosis not present

## 2015-06-03 DIAGNOSIS — I724 Aneurysm of artery of lower extremity: Secondary | ICD-10-CM

## 2015-06-03 MED ORDER — CEPHALEXIN 500 MG PO CAPS: 500.0000 mg | ORAL_CAPSULE | Freq: Two times a day (BID) | ORAL | Status: DC

## 2015-06-03 NOTE — Progress Notes (Signed)
PCP: Dr Lanny Cramp in [place] [facility])  The patient presents today for cardiology followup.  Doing reasonably well.  No further afib.  SOB is stable.  Smoking 1 ppd.  Today, he denies symptoms of palpitations, orthopnea, PND, lower extremity edema, syncope, or neurologic sequela.  The patient feels that he is tolerating medications without difficulties and is otherwise without complaint today.   Past Medical History  Diagnosis Date  . Atrial tachycardia ([place])   . CAD (coronary artery disease)     S/P PCI LAD  . Hypertension   . Personal history of unspecified circulatory disease     Transient Ischemic attacks  . Prostate cancer ([place])   . Tobacco abuse   . AAA (abdominal aortic aneurysm) ([place]) 11/04/09    3.6 cmx 3.4 cm  . Anemia   . Myocardial infarction ([place])   . Heart murmur     in 1961  . Kidney stones   . Dysrhythmia   . Chaotic atrial rhythm   . Stroke [facility])     "small TIA"  . Shortness of breath dyspnea     'with minimal exertion'  . Nocturia   . H/O transfusion of packed red blood cells     16 units of blood in 2014 d/t GI bleed  . H/O: GI bleed    Past Surgical History  Procedure Laterality Date  . Bare-metal stent      Left circumflex about 10 years ago  . Percutaneous coronary stent intervention (pci-s)      LAD  . Prostate surgery    . Tonsillectomy    . Cardiac catheterization      11/05/2008  . Abdominal aortic aneurysm repair N/A 12/15/2014    Procedure: ABDOMINAL AORTIC ANEURYSM REPAIR;  Surgeon: Elam Dutch, MD;  Location: [facility] OR;  Service: Vascular;  Laterality: N/A;  . Femoral-popliteal bypass graft Left 05/20/2015    Procedure: LEFT COMMON FEMORAL ARTERY ANEURYSM REPAIR;  Surgeon: Elam Dutch, MD;  Location: [facility];  Service: Vascular;  Laterality: Left;    Current Outpatient Prescriptions  Medication Sig Dispense Refill  . aspirin EC 81 MG tablet Take 1 tablet (81 mg total) by mouth daily. 90 tablet 3  . cephALEXin (KEFLEX) 500 MG capsule Take 1  capsule (500 mg total) by mouth 2 (two) times daily. 14 capsule 0  . cloNIDine (CATAPRES) 0.2 MG tablet TAKE ONE TABLET BY MOUTH TWICE DAILY 180 tablet 2  . dronedarone (MULTAQ) 400 MG tablet Take 1 tablet (400 mg total) by mouth 2 (two) times daily with a meal. 180 tablet 3  . lisinopril (PRINIVIL,ZESTRIL) 20 MG tablet Take 1 tablet (20 mg total) by mouth 2 (two) times daily. 180 tablet 3  . metoprolol tartrate (LOPRESSOR) 25 MG tablet Take 25 mg by mouth 2 (two) times daily.    . Multiple Vitamins-Minerals (MULTIVITAMIN WITH MINERALS) tablet Take 1 tablet by mouth daily.    . nitroGLYCERIN (NITROSTAT) 0.4 MG SL tablet Place 1 tablet (0.4 mg total) under the tongue every 5 (five) minutes as needed for chest pain. 25 tablet 12  . oxyCODONE-acetaminophen (PERCOCET/ROXICET) 5-325 MG tablet Take 1 tablet by mouth every 4 (four) hours as needed for moderate pain. 30 tablet 0  . oxyCODONE-acetaminophen (PERCOCET/ROXICET) 5-325 MG tablet Take 1 tablet by mouth as directed. Pt take a half tablet when he needs it    . pantoprazole (PROTONIX) 40 MG tablet Take 1 tablet (40 mg total) by mouth 2 (two) times daily. 60 tablet  5   No current facility-administered medications for this visit.    Allergies  Allergen Reactions  . Diclofenac Other (See Comments)    GI bleed  . Statins Diarrhea and Other (See Comments)    Muscle wasting  . Warfarin And Related Other (See Comments)    Gi bleed  . Celecoxib Itching    Itching  . Morphine And Related Other (See Comments)    Agitation    Social History   Social History  . Marital Status: Married    Spouse Name: N/A  . Number of Children: N/A  . Years of Education: N/A   Occupational History  . Retired    Social History Main Topics  . Smoking status: Current Some Day Smoker -- 1.00 packs/day for 80 years    Types: Cigarettes  . Smokeless tobacco: Never Used  . Alcohol Use: No     Comment: " No alcohol in over a year."  . Drug Use: No      Comment: No illicit drug use and only takes multivitamin as far as herbal medication goes.  . Sexual Activity: Not on file   Other Topics Concern  . Not on file   Social History Narrative   Lives in [place], [place] with his wife   Remains active, working around his house   Diet is regular     Family History  Problem Relation Age of Onset  . Heart attack Mother 36  . Heart disease Mother     < 29  . Coronary artery disease Brother   . Diabetes Brother   . Heart disease Brother     CABG  . Hyperlipidemia Brother   . Hypertension Brother   . Arrhythmia Brother     S/P cardioversion  . Heart disease Father    Physical Exam: Filed Vitals:   06/03/15 1541  BP: 164/80  Pulse: 70  Height: 5\' 11"  (1.803 m)  Weight: 179 lb (81.194 kg)    GEN- The patient is well appearing, alert and oriented x 3 today.   Head- normocephalic, atraumatic Eyes-  Sclera clear, conjunctiva pink Ears- hearing intact Oropharynx- clear Lungs- Clear to ausculation bilaterally, normal work of breathing Heart- Regular rate and rhythm, no murmurs, rubs or gallops, PMI not laterally displaced GI- soft, NT, ND, + BS, aneurysm is not palpable today Extremities- no clubbing, cyanosis, or edema  ekg today reveals sinus rhythm, LAHB, nonspecific ST/T changes  Assessment and Plan:  Atrial tachycardia/ AF   Well controlled on multaq No changes  Not on anticoagulation due to GI bleeding Could consider watchman (we discussed today) however he wishes to continue recovery from aneurysm surgery currently and not consider other procedures.  CAD  Stable asymptomatic  TOBACCO ABUSE  Cessation advised  He is not ready to quit  HYPERTENSIVE Cardiovascular disease  Stable (repeat by MD 148/86) He wishes to continue current medicine at this time  Aortic aneurysm  Repaired and followed by Dr Oneida Alar  Return to see Truitt Merle in 3 months I will see in 6 months  Thompson Grayer MD,  [facility] 06/03/2015 4:22 PM

## 2015-06-03 NOTE — Progress Notes (Signed)
Filed Vitals:   06/03/15 1031 06/03/15 1033  BP: 159/91 141/92  Pulse: 64 64  Temp: 97.1 F (36.2 C)   Resp: 18   Height: 5\' 11"  (1.803 m)   Weight: 175 lb (79.379 kg)   SpO2: 98%

## 2015-06-03 NOTE — Progress Notes (Signed)
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  he underwent repair of a left common femoral aneurysm on 05/20/2015. He denies any drainage from his incision. He denies any fever or chills.    Past Medical History   Diagnosis  Date   .  Atrial tachycardia ([place])     .  CAD (coronary artery disease)         S/P PCI LAD   .  Hypertension     .  Personal history of unspecified circulatory disease         Transient Ischemic attacks   .  Prostate cancer ([place])     .  Tobacco abuse     .  AAA (abdominal aortic aneurysm) ([place])  11/04/09       3.6 cmx 3.4 cm   .  Anemia     .  Stroke ([place])     .  Myocardial infarction ([place])     .  Heart murmur         in 1961   .  Kidney stones         Past Surgical History   Procedure  Laterality  Date   .  Bare-metal stent           Left circumflex about 10 years ago   .  Percutaneous coronary stent intervention (pci-s)           LAD   .  Prostate surgery       .  Tonsillectomy       .  Cardiac catheterization           11/05/2008   .  Abdominal aortic aneurysm repair  N/A  12/15/2014       Procedure: ABDOMINAL AORTIC ANEURYSM REPAIR;  Surgeon: Elam Dutch, MD;  Location: [facility] OR;  Service: Vascular;  Laterality: N/A;     Social History Social History   Substance Use Topics   .  Smoking status:  Current Some Day Smoker -- 0.10 packs/day for 80 years       Types:  Cigarettes   .  Smokeless tobacco:  Never Used   .  Alcohol Use:  No         Comment: " No alcohol in over a year."     Family History Family History   Problem  Relation  Age of Onset   .  Heart attack  Mother  49   .  Heart disease  Mother         < 62   .  Coronary artery disease  Brother     .  Diabetes  Brother     .  Heart disease  Brother         CABG   .  Hyperlipidemia  Brother     .  Hypertension  Brother     .  Arrhythmia  Brother      S/P cardioversion   .  Heart disease  Father       Allergies    Allergies   Allergen  Reactions   .  Celecoxib  Itching       Itching   .  Diclofenac  Other (See Comments)       GI bleed   .  Statins  Diarrhea and Other (See Comments)       Muscle wasting  Current Outpatient Prescriptions   Medication  Sig  Dispense  Refill   .  aspirin EC 81 MG tablet  Take 1 tablet (81 mg total) by mouth daily.  90 tablet  3   .  cloNIDine (CATAPRES) 0.2 MG tablet  TAKE ONE TABLET BY MOUTH TWICE DAILY  180 tablet  2   .  docusate sodium (COLACE) 100 MG capsule  Take 1 capsule (100 mg total) by mouth daily.       [name]  dronedarone (MULTAQ) 400 MG tablet  Take 1 tablet (400 mg total) by mouth 2 (two) times daily with a meal.  180 tablet  3   .  lisinopril (PRINIVIL,ZESTRIL) 20 MG tablet  Take 1 tablet (20 mg total) by mouth 2 (two) times daily.  180 tablet  3   .  metoprolol tartrate (LOPRESSOR) 25 MG tablet  Take 1 tablet (25 mg total) by mouth 2 (two) times daily. TAKE ONE TABLET BY MOUTH THREE TIMES DAILY ([place])       .  Multiple Vitamins-Minerals (MULTIVITAMIN WITH MINERALS) tablet  Take 1 tablet by mouth daily.       .  nitroGLYCERIN (NITROSTAT) 0.4 MG SL tablet  Place 1 tablet (0.4 mg total) under the tongue every 5 (five) minutes as needed for chest pain.  25 tablet  12   .  oxyCODONE-acetaminophen (PERCOCET/ROXICET) 5-325 MG tablet  Take 1 tablet by mouth every 4 (four) hours as needed for moderate pain.  30 tablet  0   .  pantoprazole (PROTONIX) 40 MG tablet  Take 1 tablet (40 mg total) by mouth 2 (two) times daily.  60 tablet  5      No current facility-administered medications for this visit.     ROS:    General:  No weight loss, Fever, chills  HEENT: No recent headaches, no nasal bleeding, no visual changes, no sore throat  Neurologic: No dizziness, blackouts, seizures. No recent symptoms of stroke or mini- stroke. No recent episodes of slurred speech, or  temporary blindness.  Cardiac: No recent episodes of chest pain/pressure, no shortness of breath at rest.  No shortness of breath with exertion.  Denies history of atrial fibrillation or irregular heartbeat  Vascular: No history of rest pain in feet.  No history of claudication.  No history of non-healing ulcer, No history of DVT    Pulmonary: No home oxygen, no productive cough, no hemoptysis,  No asthma or wheezing  Musculoskeletal:  [ ]  Arthritis, [ ]  Low back pain,  [ ]  Joint pain  Hematologic:No history of hypercoagulable state.  No history of easy bleeding.  No history of anemia  Gastrointestinal: No hematochezia or melena,  No gastroesophageal reflux, no trouble swallowing  Urinary: [ ]  chronic Kidney disease, [ ]  on HD - [ ]  MWF or [ ]  TTHS, [ ]  Burning with urination, [ ]  Frequent urination, [ ]  Difficulty urinating;    Skin: No rashes  Psychological: No history of anxiety,  No history of depression   Physical Examination    Filed Vitals:   06/03/15 1031 06/03/15 1033  BP: 159/91 141/92  Pulse: 64 64  Temp: 97.1 F (36.2 C)   Resp: 18   Height: 5\' 11"  (1.803 m)   Weight: 175 lb (79.379 kg)   SpO2: 98%     General:  Alert and oriented, no acute distress Extremity Pulses:  2+ radial, brachial, femoral, dorsalis pedis, posterior tibial pulses bilaterally, Mild erythema and some maceration  of the skin left groin incision no drainage no fluctuance   ASSESSMENT:    Doing well status post left common femoral aneurysm repair. Some mild skin irritation without any invasive infection but we will cover her with Keflex to be safe.    PLAN:   patient will follow-up with me in 3-4 weeks of his groin incision is not completely healed by that point. Otherwise I will see him again in 6 time with repeat ABIs.  Ruta Hinds, MD [facility] Office: [phone] Pager: [phone]

## 2015-06-03 NOTE — Patient Instructions (Signed)
Medication Instructions:  Your physician recommends that you continue on your current medications as directed. Please refer to the Current Medication list given to you today.  Labwork: None ordered  Testing/Procedures: None ordered  Follow-Up: Your physician recommends that you schedule a follow-up appointment in: 3 months with Truitt Merle, NP.  Your physician wants you to follow-up in: 6 months with Dr. Rayann Heman. You will receive a reminder letter in the mail two months in advance. If you don't receive a letter, please call our office to schedule the follow-up appointment.  If you need a refill on your cardiac medications before your next appointment, please call your pharmacy.  Thank you for choosing [facility]!!

## 2015-06-04 ENCOUNTER — Other Ambulatory Visit (HOSPITAL_COMMUNITY): Payer: Medicare Other

## 2015-06-04 ENCOUNTER — Ambulatory Visit: Payer: Medicare Other | Admitting: Vascular Surgery

## 2015-06-04 NOTE — Addendum Note (Signed)
Addended by: Reola Calkins on: 06/04/2015 01:44 PM   Modules accepted: Orders

## 2015-06-18 ENCOUNTER — Ambulatory Visit: Payer: Medicare Other | Admitting: Vascular Surgery

## 2015-06-18 ENCOUNTER — Encounter (HOSPITAL_COMMUNITY): Payer: Medicare Other

## 2015-07-30 ENCOUNTER — Other Ambulatory Visit: Payer: Self-pay | Admitting: Internal Medicine

## 2015-08-25 ENCOUNTER — Encounter: Payer: Self-pay | Admitting: Nurse Practitioner

## 2015-09-02 ENCOUNTER — Ambulatory Visit (INDEPENDENT_AMBULATORY_CARE_PROVIDER_SITE_OTHER): Payer: Medicare Other | Admitting: Nurse Practitioner

## 2015-09-02 ENCOUNTER — Encounter: Payer: Self-pay | Admitting: Nurse Practitioner

## 2015-09-02 VITALS — BP 130/80 | HR 80 | Ht 71.0 in | Wt 160.1 lb

## 2015-09-02 DIAGNOSIS — I519 Heart disease, unspecified: Secondary | ICD-10-CM

## 2015-09-02 DIAGNOSIS — I471 Supraventricular tachycardia: Secondary | ICD-10-CM

## 2015-09-02 DIAGNOSIS — I119 Hypertensive heart disease without heart failure: Secondary | ICD-10-CM | POA: Diagnosis not present

## 2015-09-02 DIAGNOSIS — I2510 Atherosclerotic heart disease of native coronary artery without angina pectoris: Secondary | ICD-10-CM | POA: Diagnosis not present

## 2015-09-02 DIAGNOSIS — I480 Paroxysmal atrial fibrillation: Secondary | ICD-10-CM

## 2015-09-02 DIAGNOSIS — I4719 Other supraventricular tachycardia: Secondary | ICD-10-CM

## 2015-09-02 LAB — BASIC METABOLIC PANEL
BUN: 17 mg/dL (ref 7–25)
CO2: 26 mmol/L (ref 20–31)
Calcium: 8.8 mg/dL (ref 8.6–10.3)
Chloride: 104 mmol/L (ref 98–110)
Creat: 0.97 mg/dL (ref 0.70–1.18)
Glucose, Bld: 74 mg/dL (ref 65–99)
Potassium: 4.1 mmol/L (ref 3.5–5.3)
Sodium: 139 mmol/L (ref 135–146)

## 2015-09-02 LAB — CBC
HCT: 38 % — ABNORMAL LOW (ref 38.5–50.0)
Hemoglobin: 12.1 g/dL — ABNORMAL LOW (ref 13.2–17.1)
MCH: 25.1 pg — ABNORMAL LOW (ref 27.0–33.0)
MCHC: 31.8 g/dL — ABNORMAL LOW (ref 32.0–36.0)
MCV: 78.8 fL — ABNORMAL LOW (ref 80.0–100.0)
MPV: 9.7 fL (ref 7.5–12.5)
Platelets: 215 10*3/uL (ref 140–400)
RBC: 4.82 MIL/uL (ref 4.20–5.80)
RDW: 17.6 % — ABNORMAL HIGH (ref 11.0–15.0)
WBC: 8.2 10*3/uL (ref 3.8–10.8)

## 2015-09-02 MED ORDER — LISINOPRIL 20 MG PO TABS: 20.0000 mg | ORAL_TABLET | Freq: Two times a day (BID) | ORAL | Status: DC

## 2015-09-02 NOTE — Patient Instructions (Addendum)
We will be checking the following labs today - BMET and CBC   Medication Instructions:    Continue with your current medicines.   I did send in a refill for your Lisinopril today    Testing/Procedures To Be Arranged:  Echocardiogram - try to arrange in [place] and send results to Dr. Rayann Heman  Follow-Up:   See Dr. Rayann Heman and his team back in 6 months    Other Special Instructions:   N/A    If you need a refill on your cardiac medications before your next appointment, please call your pharmacy.   Call the [place] office at [phone] if you have any questions, problems or concerns.

## 2015-09-02 NOTE — Progress Notes (Addendum)
CARDIOLOGY OFFICE NOTE  Date:  09/02/2015    Jerry Gilbert Date of Birth: 1941/02/16 Medical Record [id number]  PCP:  Wyatt Mage, MD  Cardiologist:  Allred  Chief Complaint  Patient presents with  . Irregular Heart Beat  . Coronary Artery Disease  . Hypertension  . AAA    3 month check - seen for Dr. Rayann Heman    History of Present Illness: Jerry Gilbert is a 75 y.o. male who presents today for a 3 month check. Seen for Dr. Rayann Heman.   He has a history of atrial tach, AF, known CAD with prior PCI to the LAD, HTN, ongoing tobacco abuse, prostate cancer, repaired AAA by Dr. Oneida Alar, PVD, TIA's prior GI bleed (required 16 units of blood). He is on chronic AAD therapy with Multaq. He has been maintained just on aspirin and rate controlled due to prior GIB. Echo from 2015 at [facility] showed well preserved LV function. EF was lower by Myoview from 2016 and this looks like it was read as an intermediate study.  Last seen by Dr. Rayann Heman back in December - felt to be doing ok.  Comes back today. Here with his wife. Still smoking. Not interested in stopping. Was short of breath - but has lost almost 20 pounds. Not really clear as to why. No chest pain. No issues with his rhythm. Last EF by Myoview noted to be 42%.   Past Medical History  Diagnosis Date  . Atrial tachycardia ([place])   . CAD (coronary artery disease)     S/P PCI LAD  . Hypertension   . Personal history of unspecified circulatory disease     Transient Ischemic attacks  . Prostate cancer ([place])   . Tobacco abuse   . AAA (abdominal aortic aneurysm) ([place]) 11/04/09    3.6 cmx 3.4 cm  . Anemia   . Myocardial infarction ([place])   . Heart murmur     in 1961  . Kidney stones   . Dysrhythmia   . Chaotic atrial rhythm   . Stroke [facility])     "small TIA"  . Shortness of breath dyspnea     'with minimal exertion'  . Nocturia   . H/O transfusion of packed red blood cells     16 units of blood in 2014 d/t GI bleed  . H/O: GI  bleed     Past Surgical History  Procedure Laterality Date  . Bare-metal stent      Left circumflex about 10 years ago  . Percutaneous coronary stent intervention (pci-s)      LAD  . Prostate surgery    . Tonsillectomy    . Cardiac catheterization      11/05/2008  . Abdominal aortic aneurysm repair N/A 12/15/2014    Procedure: ABDOMINAL AORTIC ANEURYSM REPAIR;  Surgeon: Elam Dutch, MD;  Location: [facility] OR;  Service: Vascular;  Laterality: N/A;  . Femoral-popliteal bypass graft Left 05/20/2015    Procedure: LEFT COMMON FEMORAL ARTERY ANEURYSM REPAIR;  Surgeon: Elam Dutch, MD;  Location: [place];  Service: Vascular;  Laterality: Left;     Medications: Current Outpatient Prescriptions  Medication Sig Dispense Refill  . aspirin EC 81 MG tablet Take 1 tablet (81 mg total) by mouth daily. 90 tablet 3  . cloNIDine (CATAPRES) 0.2 MG tablet TAKE ONE TABLET BY MOUTH TWICE DAILY 180 tablet 2  . dronedarone (MULTAQ) 400 MG tablet Take 1 tablet (400 mg total) by mouth 2 (two) times  daily with a meal. 180 tablet 3  . lisinopril (PRINIVIL,ZESTRIL) 20 MG tablet Take 1 tablet (20 mg total) by mouth 2 (two) times daily. 180 tablet 3  . metoprolol tartrate (LOPRESSOR) 25 MG tablet Take 25 mg by mouth 2 (two) times daily.    . Multiple Vitamins-Minerals (MULTIVITAMIN WITH MINERALS) tablet Take 1 tablet by mouth daily.    . nitroGLYCERIN (NITROSTAT) 0.4 MG SL tablet Place 1 tablet (0.4 mg total) under the tongue every 5 (five) minutes as needed for chest pain. 25 tablet 12  . pantoprazole (PROTONIX) 40 MG tablet Take 1 tablet (40 mg total) by mouth 2 (two) times daily. 60 tablet 5   No current facility-administered medications for this visit.    Allergies: Allergies  Allergen Reactions  . Diclofenac Other (See Comments)    GI bleed  . Statins Diarrhea and Other (See Comments)    Muscle wasting  . Warfarin And Related Other (See Comments)    Gi bleed  . Celecoxib Itching    Itching  .  Morphine And Related Other (See Comments)    Agitation    Social History: The patient  reports that he has been smoking Cigarettes.  He has a 80 pack-year smoking history. He has never used smokeless tobacco. He reports that he does not drink alcohol or use illicit drugs.   Family History: The patient's family history includes Arrhythmia in his brother; Coronary artery disease in his brother; Diabetes in his brother; Heart attack (age of onset: 91) in his mother; Heart disease in his brother, father, and mother; Hyperlipidemia in his brother; Hypertension in his brother.   Review of Systems: Please see the history of present illness.   Otherwise, the review of systems is positive for none.   All other systems are reviewed and negative.   Physical Exam: VS:  BP 130/80 mmHg  Pulse 80  Ht 5\' 11"  (1.803 m)  Wt 160 lb 1.9 oz (72.63 kg)  BMI 22.34 kg/m2  SpO2 92% .  BMI Body mass index is 22.34 kg/(m^2).  Wt Readings from Last 3 Encounters:  09/02/15 160 lb 1.9 oz (72.63 kg)  06/03/15 179 lb (81.194 kg)  06/03/15 175 lb (79.379 kg)    General: He is alert. He looks chronically ill. Reeks of tobacco.  He is in no acute distress.  HEENT: Normal. Neck: Supple, no JVD, carotid bruits, or masses noted.  Cardiac: Heart tones are distant. No edema.  Respiratory:  Lungs are fairly clear to auscultation bilaterally with normal work of breathing.  GI: Soft and nontender.  MS: No deformity or atrophy. Gait and ROM intact. Skin: Warm and dry. Color is normal.  Neuro:  Strength and sensation are intact and no gross focal deficits noted.  Psych: Alert, appropriate and with normal affect.   LABORATORY DATA:  EKG:  EKG is not ordered today.  Lab Results  Component Value Date   WBC 9.2 05/21/2015   HGB 10.6* 05/21/2015   HCT 33.0* 05/21/2015   PLT 183 05/21/2015   GLUCOSE 116* 05/21/2015   CHOL 199 09/01/2014   TRIG 79.0 09/01/2014   HDL 32.60* 09/01/2014   LDLDIRECT 174.2 12/19/2011    LDLCALC 151* 09/01/2014   ALT 16* 05/19/2015   AST 17 05/19/2015   NA 136 05/21/2015   K 4.3 05/21/2015   CL 103 05/21/2015   CREATININE 1.06 05/21/2015   BUN 16 05/21/2015   CO2 24 05/21/2015   TSH 1.383 Test methodology is 3rd generation TSH  05/15/2009   INR 0.96 05/19/2015    BNP (last 3 results) No results for input(s): BNP in the last 8760 hours.  ProBNP (last 3 results) No results for input(s): PROBNP in the last 8760 hours.   Other Studies Reviewed Today: Myoview Study Highlights from 11/2014     Nuclear stress EF: 42%.  There was no ST segment deviation noted during stress.  No T wave inversion was noted during stress.  Defect 1: There is a medium defect of moderate severity present in the basal inferior, mid inferior and apical inferior location.  This is a low risk study.  Intermediate risk study.  Compared to 2013, there appears to a new inferior wall perfusion defect and associated hypokinesis, with mild to moderate reduction in overall systolic function. While the defect appears mostly fixed, rather than reversible, the presence of severe interference from visceral tracer activity reduces the confidence in this evaluation.        Assessment/Plan:  Atrial tachycardia/ AF - Well controlled on multaq. Not on anticoagulation due to prior massive GI bleed. Last EF concerning. Needs echo updated. If EF is low - his AAD therapy will need to be readdressed - would defer to Dr. Rayann Heman and his team.   CAD - Stable with no current symptoms. Would continue with current regimen.   TOBACCO ABUSE - he is not interested in stopping.   HYPERTENSIVE Cardiovascular disease - BP ok on current regimen.   Aortic aneurysm Repaired and followed by Dr Oneida Alar  Considerable weight loss - needs to see PCP. Baseline labs today.   Current medicines are reviewed with the patient today.  The patient does not have concerns regarding medicines other than what has been noted  above.  The following changes have been made:  See above.  Labs/ tests ordered today include:    Orders Placed This Encounter  Procedures  . Basic metabolic panel  . CBC     Disposition:   FU with Dr. Rayann Heman and his team going forward.  Patient is agreeable to this plan and will call if any problems develop in the interim.   Signed: Burtis Junes, RN, ANP-C 09/02/2015 10:59 AM  [facility] [address] [place], [place]  [address] Phone: [phone] Fax: [phone]   Addendum: Echo received - EF is 40% with moderate MR, mild TR. Reviewed with Dr. Rayann Heman in the office 09/21/2015  He does not wish to change the patient's current regimen. No changes made. Patient is to follow up with EP team going forward.  Burtis Junes, RN, [facility] [address] [place], [place]  [address] [phone]

## 2015-09-07 ENCOUNTER — Telehealth: Payer: Self-pay | Admitting: Nurse Practitioner

## 2015-09-07 NOTE — Telephone Encounter (Signed)
Order placed in nurse fax box.

## 2015-09-07 NOTE — Telephone Encounter (Signed)
[name] is calling to have a order faxed over for an echo to be done for  Jerry Gilbert . Please Fax to [phone], any questions please call   Thanks

## 2015-09-21 ENCOUNTER — Telehealth: Payer: Self-pay | Admitting: *Deleted

## 2015-09-21 ENCOUNTER — Telehealth: Payer: Self-pay | Admitting: Internal Medicine

## 2015-09-21 NOTE — Telephone Encounter (Signed)
F/U  Pt returned call to the nurse.

## 2015-09-21 NOTE — Telephone Encounter (Signed)
S/w is aware of echo results from [facility] in [place], [place].

## 2015-09-21 NOTE — Telephone Encounter (Signed)
LM FOR PT'S WIFE   TO CALL BACK .[name]

## 2015-09-21 NOTE — Telephone Encounter (Signed)
PT'S WIFE  AWARE ECHO  WAS  REVIEWED  NO CHANGES  AT THIS TIME.  LOW EF  COULD  BE FROM NUMBER  OF  REASONS  CONTINUE TO MONITOR  HAS  F/U  IN 6 MONTHS  MAY ANSWER  FURTHER  QUESTIONS  AT  THAT TIME .Iris Pert UNDERSTANDING .[name]

## 2015-09-21 NOTE — Telephone Encounter (Signed)
Echo reviewed and discussed with Truitt Merle. No changes advised at this time Will continue to follow.

## 2015-09-21 NOTE — Telephone Encounter (Signed)
New message    Pt wants to discuss echo results w/Dr. Rayann Heman. Please call.

## 2015-09-21 NOTE — Telephone Encounter (Signed)
SPOKE WITH  PT'S WIFE  WOULD  LIKE  DR  ALLRED  TO CALL AND   DISCUSS  ECHO  RESULTS.PT'S WIFE  AWARE WILL FORWARD .BASED ON LORI'S DICTATION  EF  DOWN TO 40 % UNABLE  TO  LOCATE  RECENT  ECHO  WILL FORWARD TO DR Rayann Heman  FOR  REVIEW .[name]

## 2015-09-21 NOTE — Addendum Note (Signed)
Addended by: Burtis Junes on: 09/21/2015 08:04 AM   Modules accepted: SmartSet

## 2015-09-23 ENCOUNTER — Telehealth: Payer: Self-pay | Admitting: Internal Medicine

## 2015-09-23 NOTE — Telephone Encounter (Signed)
Patient calling the office for samples of medication:   1.  What medication and dosage are you requesting samples for? Multaq-please leave a message f she does not answer  2.  Are you currently out of this medication? Need some because he is in the doughnut hole

## 2015-09-23 NOTE — Telephone Encounter (Signed)
Left sample up front for patient. Called number in chart but it is not in service.

## 2015-11-27 ENCOUNTER — Encounter: Payer: Self-pay | Admitting: Vascular Surgery

## 2015-12-02 ENCOUNTER — Other Ambulatory Visit: Payer: Self-pay | Admitting: Internal Medicine

## 2015-12-02 DIAGNOSIS — I119 Hypertensive heart disease without heart failure: Secondary | ICD-10-CM

## 2015-12-02 NOTE — Telephone Encounter (Signed)
New message      Patient calling the office for samples of medication:   1.  What medication and dosage are you requesting samples for? multaq 400mg   2.  Are you currently out of this medication? No,   In the donut hole---presc cost 400.00

## 2015-12-03 ENCOUNTER — Other Ambulatory Visit: Payer: Self-pay | Admitting: Internal Medicine

## 2015-12-03 ENCOUNTER — Ambulatory Visit (HOSPITAL_COMMUNITY)
Admission: RE | Admit: 2015-12-03 | Discharge: 2015-12-03 | Disposition: A | Discharge status: Home / Self Care | Payer: Medicare Other | Source: Ambulatory Visit | Attending: Vascular Surgery | Admitting: Vascular Surgery

## 2015-12-03 ENCOUNTER — Ambulatory Visit (INDEPENDENT_AMBULATORY_CARE_PROVIDER_SITE_OTHER): Payer: Medicare Other | Admitting: Vascular Surgery

## 2015-12-03 ENCOUNTER — Encounter: Payer: Self-pay | Admitting: Vascular Surgery

## 2015-12-03 VITALS — BP 138/71 | HR 57 | Temp 97.8°F | Resp 18 | Ht 67.5 in | Wt 164.6 lb

## 2015-12-03 DIAGNOSIS — I2510 Atherosclerotic heart disease of native coronary artery without angina pectoris: Secondary | ICD-10-CM | POA: Diagnosis not present

## 2015-12-03 DIAGNOSIS — I714 Abdominal aortic aneurysm, without rupture, unspecified: Secondary | ICD-10-CM

## 2015-12-03 DIAGNOSIS — I771 Stricture of artery: Secondary | ICD-10-CM | POA: Diagnosis not present

## 2015-12-03 DIAGNOSIS — I724 Aneurysm of artery of lower extremity: Secondary | ICD-10-CM | POA: Diagnosis present

## 2015-12-03 NOTE — Telephone Encounter (Signed)
Follow Up:     Pt wants to know if there were any samples?

## 2015-12-03 NOTE — Telephone Encounter (Signed)
called and informed pt we placed 4 packs of multag 400 mg up front.

## 2015-12-03 NOTE — Progress Notes (Signed)
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 He denies any drainage from his incision. He denies any fever or chills. He has noticed a bulge in his midline laparotomy incision at the upper aspect. This increases in size with Valsalva.        Past Medical History   Diagnosis  Date   .  Atrial tachycardia ([place])     .  CAD (coronary artery disease)         S/P PCI LAD   .  Hypertension     .  Personal history of unspecified circulatory disease         Transient Ischemic attacks   .  Prostate cancer ([place])     .  Tobacco abuse     .  AAA (abdominal aortic aneurysm) ([place])  11/04/09       3.6 cmx 3.4 cm   .  Anemia     .  Stroke ([place])     .  Myocardial infarction ([place])     .  Heart murmur         in 1961   .  Kidney stones               Past Surgical History   Procedure  Laterality  Date   .  Bare-metal stent           Left circumflex about 10 years ago   .  Percutaneous coronary stent intervention (pci-s)           LAD   .  Prostate surgery       .  Tonsillectomy       .  Cardiac catheterization           11/05/2008   .  Abdominal aortic aneurysm repair  N/A  12/15/2014       Procedure: ABDOMINAL AORTIC ANEURYSM REPAIR;  Surgeon: Elam Dutch, MD;  Location: [facility] OR;  Service: Vascular;  Laterality: N/A;       Social History       Social History   Substance Use Topics   .  Smoking status:  Current Some Day Smoker -- 0.10 packs/day for 80 years       Types:  Cigarettes   .  Smokeless tobacco:  Never Used   .  Alcohol Use:  No         Comment: " No alcohol in over a year."       Family History       Family History   Problem  Relation  Age of Onset   .  Heart attack  Mother  50   .  Heart disease  Mother         < 60   .  Coronary artery disease  Brother     .   Diabetes  Brother     .  Heart disease  Brother         CABG   .  Hyperlipidemia  Brother     .  Hypertension  Brother     .  Arrhythmia  Brother         S/P cardioversion   .  Heart disease  Father         Allergies  Allergies   Allergen  Reactions   .  Celecoxib  Itching       Itching   .  Diclofenac  Other (See Comments)       GI bleed   .  Statins  Diarrhea and Other (See Comments)       Muscle wasting               Current Outpatient Prescriptions   Medication  Sig  Dispense  Refill   .  aspirin EC 81 MG tablet  Take 1 tablet (81 mg total) by mouth daily.  90 tablet  3   .  cloNIDine (CATAPRES) 0.2 MG tablet  TAKE ONE TABLET BY MOUTH TWICE DAILY  180 tablet  2   .  docusate sodium (COLACE) 100 MG capsule  Take 1 capsule (100 mg total) by mouth daily.       [name]  dronedarone (MULTAQ) 400 MG tablet  Take 1 tablet (400 mg total) by mouth 2 (two) times daily with a meal.  180 tablet  3   .  lisinopril (PRINIVIL,ZESTRIL) 20 MG tablet  Take 1 tablet (20 mg total) by mouth 2 (two) times daily.  180 tablet  3   .  metoprolol tartrate (LOPRESSOR) 25 MG tablet  Take 1 tablet (25 mg total) by mouth 2 (two) times daily. TAKE ONE TABLET BY MOUTH THREE TIMES DAILY ([place])       .  Multiple Vitamins-Minerals (MULTIVITAMIN WITH MINERALS) tablet  Take 1 tablet by mouth daily.       .  nitroGLYCERIN (NITROSTAT) 0.4 MG SL tablet  Place 1 tablet (0.4 mg total) under the tongue every 5 (five) minutes as needed for chest pain.  25 tablet  12   .  oxyCODONE-acetaminophen (PERCOCET/ROXICET) 5-325 MG tablet  Take 1 tablet by mouth every 4 (four) hours as needed for moderate pain.  30 tablet  0   .  pantoprazole (PROTONIX) 40 MG tablet  Take 1 tablet (40 mg total) by mouth 2 (two) times daily.  60 tablet  5     No current facility-administered medications for this visit.       ROS:     General:  No weight loss, Fever, chills   Cardiac: No recent episodes of chest  pain/pressure, no shortness of breath at rest.  No shortness of breath with exertion.  + history of atrial fibrillation or irregular heartbeat   Pulmonary: No home oxygen, no productive cough, no hemoptysis,  No asthma or wheezing      Physical Examination    Vitals:   12/03/15 1218  BP: 138/71  Pulse: (!) 57  Resp: 18  Temp: 97.8 F (36.6 C)  TempSrc: Oral  SpO2: 95%  Weight: 164 lb 9.6 oz (74.7 kg)  Height: 5' 7.5" (1.715 m)     General:  Alert and oriented, no acute distress Abdomen: 2 cm defect in the upper portion of his midline incision increasing in size with Valsalva spontaneously reducible Extremity Pulses:  2+ radial, brachial, femoral, dorsalis pedis, posterior tibial pulses bilaterally, healed left groin incision no drainage no fluctuance  Data: Patient had bilateral ABIs performed today right side was 1.11 triphasic left side was 0.9 biphasic     ASSESSMENT:    Doing well status post aortic and left common femoral aneurysm repair. Small ventral hernia with mild occasional pain symptoms       PLAN:    I discussed with the patient today referral  to general surgery for repair of his hernia. However he wishes to think about this for now. He will follow-up with me in 1 year.   Ruta Hinds, MD [facility] [place] Office: [phone] Pager: [phone]

## 2016-01-07 ENCOUNTER — Ambulatory Visit (INDEPENDENT_AMBULATORY_CARE_PROVIDER_SITE_OTHER): Payer: Medicare Other | Admitting: Internal Medicine

## 2016-01-07 ENCOUNTER — Encounter (INDEPENDENT_AMBULATORY_CARE_PROVIDER_SITE_OTHER): Payer: Self-pay

## 2016-01-07 ENCOUNTER — Encounter: Payer: Self-pay | Admitting: Internal Medicine

## 2016-01-07 VITALS — BP 134/78 | HR 72 | Ht 67.5 in | Wt 170.8 lb

## 2016-01-07 DIAGNOSIS — I119 Hypertensive heart disease without heart failure: Secondary | ICD-10-CM | POA: Diagnosis not present

## 2016-01-07 DIAGNOSIS — I2510 Atherosclerotic heart disease of native coronary artery without angina pectoris: Secondary | ICD-10-CM

## 2016-01-07 DIAGNOSIS — I471 Supraventricular tachycardia: Secondary | ICD-10-CM

## 2016-01-07 DIAGNOSIS — I480 Paroxysmal atrial fibrillation: Secondary | ICD-10-CM

## 2016-01-07 DIAGNOSIS — F17200 Nicotine dependence, unspecified, uncomplicated: Secondary | ICD-10-CM

## 2016-01-07 DIAGNOSIS — I519 Heart disease, unspecified: Secondary | ICD-10-CM

## 2016-01-07 MED ORDER — PANTOPRAZOLE SODIUM 40 MG PO TBEC: 40.0000 mg | DELAYED_RELEASE_TABLET | Freq: Two times a day (BID) | ORAL | 11 refills | Status: DC

## 2016-01-07 NOTE — Progress Notes (Signed)
PCP: Dr Lanny Cramp in [place] [facility])  The patient presents today for cardiology followup.  Doing reasonably well.   SOB is stable.  Smoking 1 ppd.  Rare afib episodes.  Today, he denies symptoms of palpitations, orthopnea, PND, lower extremity edema, syncope, or neurologic sequela.  The patient feels that he is tolerating medications without difficulties and is otherwise without complaint today.   Past Medical History:  Diagnosis Date  . AAA (abdominal aortic aneurysm) ([place]) 11/04/09   3.6 cmx 3.4 cm  . Anemia   . Atrial tachycardia ([place])   . CAD (coronary artery disease)    S/P PCI LAD  . Chaotic atrial rhythm   . Dysrhythmia   . H/O transfusion of packed red blood cells    16 units of blood in 2014 d/t GI bleed  . H/O: GI bleed   . Heart murmur    in 1961  . Hypertension   . Kidney stones   . Myocardial infarction   . Nocturia   . Personal history of unspecified circulatory disease    Transient Ischemic attacks  . Prostate cancer ([place])   . Shortness of breath dyspnea    'with minimal exertion'  . Stroke [facility])    "small TIA"  . Tobacco abuse    Past Surgical History:  Procedure Laterality Date  . ABDOMINAL AORTIC ANEURYSM REPAIR N/A 12/15/2014   Procedure: ABDOMINAL AORTIC ANEURYSM REPAIR;  Surgeon: Elam Dutch, MD;  Location: [place];  Service: Vascular;  Laterality: N/A;  . Bare-metal stent     Left circumflex about 10 years ago  . CARDIAC CATHETERIZATION     11/05/2008  . FEMORAL-POPLITEAL BYPASS GRAFT Left 05/20/2015   Procedure: LEFT COMMON FEMORAL ARTERY ANEURYSM REPAIR;  Surgeon: Elam Dutch, MD;  Location: [place];  Service: Vascular;  Laterality: Left;  . PERCUTANEOUS CORONARY STENT INTERVENTION (PCI-S)     LAD  . PROSTATE SURGERY    . TONSILLECTOMY      Current Outpatient Prescriptions  Medication Sig Dispense Refill  . aspirin EC 81 MG tablet Take 1 tablet (81 mg total) by mouth daily. 90 tablet 3  . cloNIDine (CATAPRES) 0.2 MG tablet TAKE ONE TABLET  BY MOUTH TWICE DAILY 180 tablet 2  . dronedarone (MULTAQ) 400 MG tablet Take 1 tablet (400 mg total) by mouth 2 (two) times daily with a meal. 180 tablet 3  . lisinopril (PRINIVIL,ZESTRIL) 20 MG tablet Take 1 tablet (20 mg total) by mouth 2 (two) times daily. 180 tablet 3  . metoprolol tartrate (LOPRESSOR) 25 MG tablet Take 25 mg by mouth as directed. Take two to three times a day    . Multiple Vitamins-Minerals (MULTIVITAMIN WITH MINERALS) tablet Take 1 tablet by mouth daily.    . nitroGLYCERIN (NITROSTAT) 0.4 MG SL tablet Place 1 tablet (0.4 mg total) under the tongue every 5 (five) minutes as needed for chest pain. 25 tablet 12  . pantoprazole (PROTONIX) 40 MG tablet Take 1 tablet (40 mg total) by mouth 2 (two) times daily. 60 tablet 5   No current facility-administered medications for this visit.     Allergies  Allergen Reactions  . Diclofenac Other (See Comments)    GI bleed  . Statins Diarrhea and Other (See Comments)    Muscle wasting  . Warfarin And Related Other (See Comments)    Gi bleed  . Celecoxib Itching    Itching  . Morphine And Related Other (See Comments)    Agitation  Social History   Social History  . Marital status: Married    Spouse name: N/A  . Number of children: N/A  . Years of education: N/A   Occupational History  . Retired    Social History Main Topics  . Smoking status: Current Some Day Smoker    Packs/day: 1.00    Years: 80.00    Types: Cigarettes  . Smokeless tobacco: Never Used  . Alcohol use No     Comment: " No alcohol in over a year."  . Drug use: No     Comment: No illicit drug use and only takes multivitamin as far as herbal medication goes.  . Sexual activity: Not on file   Other Topics Concern  . Not on file   Social History Narrative   Lives in [place], [place] with his wife   Remains active, working around his house   Diet is regular     Family History  Problem Relation Age of Onset  . Heart attack Mother 29    . Heart disease Mother     < 63  . Coronary artery disease Brother   . Diabetes Brother   . Heart disease Brother     CABG  . Hyperlipidemia Brother   . Hypertension Brother   . Arrhythmia Brother     S/P cardioversion  . Heart disease Father    Physical Exam: Vitals:   01/07/16 1112  BP: 134/78  Pulse: 72  Weight: 170 lb 12.8 oz (77.5 kg)  Height: 5' 7.5" (1.715 m)    GEN- The patient is well appearing, alert and oriented x 3 today.   Head- normocephalic, atraumatic Eyes-  Sclera clear, conjunctiva pink Ears- hearing intact Oropharynx- clear Lungs- Clear to ausculation bilaterally, normal work of breathing Heart- Regular rate and rhythm, no murmurs, rubs or gallops, PMI not laterally displaced GI- soft, NT, ND, + BS, aneurysm is not palpable today Extremities- no clubbing, cyanosis, or edema  ekg today reveals sinus rhythm 72 bpm, LAHB, nonspecific ST/T changes  Assessment and Plan:  Atrial tachycardia/ AF   Well controlled on multaq No changes  Not on anticoagulation due to GI bleeding We discussed watchman LAAO device which he declines at this time.  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

## 2016-01-07 NOTE — Patient Instructions (Signed)

## 2016-03-10 ENCOUNTER — Other Ambulatory Visit: Payer: Self-pay | Admitting: Internal Medicine

## 2016-03-10 NOTE — Telephone Encounter (Signed)
New Message    *STAT* If patient is at the pharmacy, call can be transferred to refill team.   1. Which medications need to be refilled? (please list name of each medication and dose if known)   dronedarone (MULTAQ) 400 MG tablet     2. Which pharmacy/location (including street and city if local pharmacy) is medication to be sent to? [facility] in [place], [place]  3. Do they need a 30 day or 90 day supply? 30 day

## 2016-04-26 ENCOUNTER — Other Ambulatory Visit: Payer: Self-pay | Admitting: Internal Medicine

## 2016-06-27 ENCOUNTER — Encounter: Payer: Self-pay | Admitting: Cardiology

## 2016-08-26 ENCOUNTER — Other Ambulatory Visit: Payer: Self-pay | Admitting: *Deleted

## 2016-08-26 ENCOUNTER — Telehealth: Payer: Self-pay | Admitting: Internal Medicine

## 2016-08-26 DIAGNOSIS — I119 Hypertensive heart disease without heart failure: Secondary | ICD-10-CM

## 2016-08-26 MED ORDER — LISINOPRIL 20 MG PO TABS: 20.0000 mg | ORAL_TABLET | Freq: Two times a day (BID) | ORAL | 0 refills | Status: DC

## 2016-08-26 NOTE — Telephone Encounter (Signed)
Pt's wife requesting multaq 400mg  samples-and needs refill of Lisinopril at  [facility] call [phone]

## 2016-08-26 NOTE — Telephone Encounter (Signed)
I have sent a refill of lisinopril to patients pharmacy and wife has been made aware. I have also informed patients wife that I can leave two weeks of samples of multaq at the front desk and made her aware that we are not able to do this every month. She verbalized understanding and was appreciative of my call.

## 2016-11-30 ENCOUNTER — Other Ambulatory Visit: Payer: Self-pay | Admitting: Nurse Practitioner

## 2016-11-30 ENCOUNTER — Other Ambulatory Visit: Payer: Self-pay | Admitting: Internal Medicine

## 2016-11-30 DIAGNOSIS — I119 Hypertensive heart disease without heart failure: Secondary | ICD-10-CM

## 2016-12-05 ENCOUNTER — Other Ambulatory Visit: Payer: Self-pay

## 2016-12-05 ENCOUNTER — Telehealth: Payer: Self-pay | Admitting: Internal Medicine

## 2016-12-05 DIAGNOSIS — I119 Hypertensive heart disease without heart failure: Secondary | ICD-10-CM

## 2016-12-05 MED ORDER — LISINOPRIL 20 MG PO TABS: 20.0000 mg | ORAL_TABLET | Freq: Two times a day (BID) | ORAL | 0 refills | Status: DC

## 2016-12-05 NOTE — Addendum Note (Signed)
Addended by: Juventino Slovak on: 12/05/2016 02:20 PM   Modules accepted: Orders

## 2016-12-05 NOTE — Telephone Encounter (Signed)
Approved    Disp Refills Start End  lisinopril (PRINIVIL,ZESTRIL) 20 MG tablet 30 tablet 0 12/05/2016 12/05/2016  Sig - Route:  Take 1 tablet (20 mg total) by mouth 2 (two) times daily. - Oral  Class:  Phone In  DAW:  No  Comment:  Please call our office to schedule a yearly appointment with Dr. Rayann Heman for October before anymore refills. [phone]. Thank you 2nd attempt  Authorizing Provider:  Thompson Grayer, MD  Ordering User:  Mendel Ryder, CMA  lisinopril (PRINIVIL,ZESTRIL) 20 MG tablet 30 tablet 0 12/05/2016   Sig - Route:  Take 1 tablet (20 mg total) by mouth 2 (two) times daily. - Oral  Class:  Normal  DAW:  No  Comment:  Please call our office to schedule a yearly appointment with Dr. Rayann Heman for October before anymore refills. [phone]. Thank you 2nd attempt  Authorizing Provider:  Thompson Grayer, MD  Ordering User:  Juventino Slovak, [place], [place] by   Mendel Ryder, CMA on 12/05/2016 12:30 PM

## 2016-12-05 NOTE — Telephone Encounter (Signed)
New Message   *STAT* If patient is at the pharmacy, call can be transferred to refill team.   1. Which medications need to be refilled? (please list name of each medication and dose if known) Lisinopril 20mg    2. Which pharmacy/location (including street and city if local pharmacy) is medication to be sent to? [place]  3. Do they need a 30 day or 90 day supply? [place]

## 2016-12-20 ENCOUNTER — Telehealth: Payer: Self-pay | Admitting: Internal Medicine

## 2016-12-20 DIAGNOSIS — I119 Hypertensive heart disease without heart failure: Secondary | ICD-10-CM

## 2016-12-20 MED ORDER — PANTOPRAZOLE SODIUM 40 MG PO TBEC
40.0000 mg | DELAYED_RELEASE_TABLET | Freq: Two times a day (BID) | ORAL | 1 refills | Status: DC
Start: 2016-12-20 — End: 2017-03-31

## 2016-12-20 MED ORDER — LISINOPRIL 20 MG PO TABS: 20.0000 mg | ORAL_TABLET | Freq: Two times a day (BID) | ORAL | 1 refills | Status: DC

## 2016-12-20 NOTE — Telephone Encounter (Signed)
New message    Pt is calling.    *STAT* If patient is at the pharmacy, call can be transferred to refill team.   1. Which medications need to be refilled? (please list name of each medication and dose if known) lisinopril, pantoprazole   2. Which pharmacy/location (including street and city if local pharmacy) is medication to be sent to? [facility] in [place]   3. Do they need a 30 day or 90 day supply? 30 day

## 2016-12-20 NOTE — Telephone Encounter (Signed)
Spoke with patient and he stated that he is in the donut hole. I made him aware that I could place some samples at the front desk and also that we are unable to provide samples for the remainder of the year. I began to discuss patient assistance and he then stated that his phone was almost dead but his brother would come to the office to pick up the samples.

## 2016-12-20 NOTE — Telephone Encounter (Signed)
New MEssage  Patient calling the office for samples of medication:   1.  What medication and dosage are you requesting samples for? Multaq 400mg    2.  Are you currently out of this medication? yes

## 2017-01-06 ENCOUNTER — Other Ambulatory Visit: Payer: Self-pay | Admitting: Internal Medicine

## 2017-01-06 DIAGNOSIS — I119 Hypertensive heart disease without heart failure: Secondary | ICD-10-CM

## 2017-02-08 ENCOUNTER — Encounter: Payer: Self-pay | Admitting: Internal Medicine

## 2017-02-13 ENCOUNTER — Other Ambulatory Visit: Payer: Self-pay | Admitting: Internal Medicine

## 2017-02-13 DIAGNOSIS — I119 Hypertensive heart disease without heart failure: Secondary | ICD-10-CM

## 2017-02-15 ENCOUNTER — Encounter: Payer: Self-pay | Admitting: Internal Medicine

## 2017-02-15 ENCOUNTER — Ambulatory Visit (INDEPENDENT_AMBULATORY_CARE_PROVIDER_SITE_OTHER): Payer: Medicare Other | Admitting: Internal Medicine

## 2017-02-15 VITALS — BP 130/74 | HR 67 | Ht 67.5 in | Wt 181.0 lb

## 2017-02-15 DIAGNOSIS — I2510 Atherosclerotic heart disease of native coronary artery without angina pectoris: Secondary | ICD-10-CM | POA: Diagnosis not present

## 2017-02-15 DIAGNOSIS — F17200 Nicotine dependence, unspecified, uncomplicated: Secondary | ICD-10-CM | POA: Diagnosis not present

## 2017-02-15 DIAGNOSIS — I480 Paroxysmal atrial fibrillation: Secondary | ICD-10-CM | POA: Diagnosis not present

## 2017-02-15 DIAGNOSIS — I471 Supraventricular tachycardia: Secondary | ICD-10-CM | POA: Diagnosis not present

## 2017-02-15 NOTE — Patient Instructions (Signed)

## 2017-02-15 NOTE — Progress Notes (Signed)
PCP: Wyatt Mage, MD   Primary EP: Dr Rayann Heman  Jerry Gilbert is a 76 y.o. male who presents today for routine electrophysiology followup.  Since last being seen in our clinic, the patient reports doing very well.  Today, he denies symptoms of palpitations, chest pain, shortness of breath,  lower extremity edema, dizziness, presyncope, or syncope.  The patient is otherwise without complaint today.   Past Medical History:  Diagnosis Date  . AAA (abdominal aortic aneurysm) ([place]) 11/04/09   3.6 cmx 3.4 cm  . Anemia   . Atrial tachycardia ([place])   . CAD (coronary artery disease)    S/P PCI LAD  . Chaotic atrial rhythm   . Dysrhythmia   . H/O transfusion of packed red blood cells    16 units of blood in 2014 d/t GI bleed  . H/O: GI bleed   . Heart murmur    in 1961  . Hypertension   . Kidney stones   . Myocardial infarction ([place])   . Nocturia   . Personal history of unspecified circulatory disease    Transient Ischemic attacks  . Prostate cancer ([place])   . Shortness of breath dyspnea    'with minimal exertion'  . Stroke [facility])    "small TIA"  . Tobacco abuse    Past Surgical History:  Procedure Laterality Date  . ABDOMINAL AORTIC ANEURYSM REPAIR N/A 12/15/2014   Procedure: ABDOMINAL AORTIC ANEURYSM REPAIR;  Surgeon: Elam Dutch, MD;  Location: [place];  Service: Vascular;  Laterality: N/A;  . Bare-metal stent     Left circumflex about 10 years ago  . CARDIAC CATHETERIZATION     11/05/2008  . FEMORAL-POPLITEAL BYPASS GRAFT Left 05/20/2015   Procedure: LEFT COMMON FEMORAL ARTERY ANEURYSM REPAIR;  Surgeon: Elam Dutch, MD;  Location: [place];  Service: Vascular;  Laterality: Left;  . PERCUTANEOUS CORONARY STENT INTERVENTION (PCI-S)     LAD  . PROSTATE SURGERY    . TONSILLECTOMY      ROS- all systems are reviewed and negatives except as per HPI above  Current Outpatient Medications  Medication Sig Dispense Refill  . aspirin EC 81 MG tablet Take 1 tablet (81 mg total)  by mouth daily. 90 tablet 3  . cloNIDine (CATAPRES) 0.2 MG tablet TAKE 1 TABLET BY MOUTH TWICE DAILY 120 tablet 0  . lisinopril (PRINIVIL,ZESTRIL) 20 MG tablet TAKE 1 TABLET BY MOUTH TWICE DAILY *MUST ATTEND APPOINTMENT FOR FURTHER REFILLS* 60 tablet 0  . metoprolol tartrate (LOPRESSOR) 25 MG tablet Take 25 mg by mouth as directed. Take two to three times a day    . metoprolol tartrate (LOPRESSOR) 25 MG tablet TAKE ONE TABLET BY MOUTH THREE TIMES DAILY **GENERIC  FOR  LOPRESSOR** 180 tablet 0  . MULTAQ 400 MG tablet TAKE 1 TABLET BY MOUTH TWICE DAILY WITH MEALS 120 tablet 0  . Multiple Vitamins-Minerals (MULTIVITAMIN WITH MINERALS) tablet Take 1 tablet by mouth daily.    . nitroGLYCERIN (NITROSTAT) 0.4 MG SL tablet Place 1 tablet (0.4 mg total) under the tongue every 5 (five) minutes as needed for chest pain. 25 tablet 12  . pantoprazole (PROTONIX) 40 MG tablet Take 1 tablet (40 mg total) by mouth 2 (two) times daily. 60 tablet 1   No current facility-administered medications for this visit.     Physical Exam: Vitals:   02/15/17 1407  BP: 130/74  Pulse: 67  SpO2: 97%  Weight: 181 lb (82.1 kg)  Height: 5' 7.5" (1.715  m)    GEN- The patient is well appearing, alert and oriented x 3 today.   Head- normocephalic, atraumatic Eyes-  Sclera clear, conjunctiva pink Ears- hearing intact Oropharynx- clear Lungs- Clear to ausculation bilaterally, normal work of breathing Heart- Regular rate and rhythm, no murmurs, rubs or gallops, PMI not laterally displaced GI- soft, NT, ND, + BS Extremities- no clubbing, cyanosis, or edema  EKG tracing ordered today is personally reviewed and shows sinus rhythm 62 bpm, PR 122 msec, nonspecific ST/T changes, QTc 462 msec  Assessment and Plan:  1. Atrial fibrillation/ atrial tachycardia Well controlled with multaq Given prior GI bleeding, he declines anticoagulation He has also declined watchman  2. CAD No ischemic symptoms No changes  3. Ischemic  CM EF 40% by prior echo Continue medical mangement  4. Hypertensive cardiovascular disease BP is controlled No changes  5. Tobacco Cessation advised  6. Aortic aneurysm Repaired Followed by Dr Oneida Alar  Return to see me in a year  Thompson Grayer MD, [facility] 02/15/2017 2:16 PM

## 2017-03-08 ENCOUNTER — Telehealth: Payer: Self-pay | Admitting: Internal Medicine

## 2017-03-08 NOTE — Telephone Encounter (Signed)
New message    Patient wife calling to inform Dr Rayann Heman that patient is in [place] and going to have emergency  bypass surgery at [facility]. Patient wife requested I inform nurse.

## 2017-03-10 HISTORY — PX: CORONARY ARTERY BYPASS GRAFT: SHX141

## 2017-03-10 NOTE — Telephone Encounter (Signed)
Please schedule an appointment with general cardiology when he returns home from his surgery.

## 2017-03-15 ENCOUNTER — Telehealth: Payer: Self-pay | Admitting: Internal Medicine

## 2017-03-15 NOTE — Telephone Encounter (Signed)
Called wife and exchanged hellos and then phone call disconnected.  Attempted to call back x 2 and phone went straight to VM.  Will try again later.

## 2017-03-15 NOTE — Telephone Encounter (Signed)
Spoke with wife, DPR on file. Pt in [facility] for Christmas, experienced CP and went to the hospital with NSTEMI, per wife.  Pt ended up with CABG x 4.  Pt went into Afib after CABG.  Cardiology tried Multaq with no success.  Stopped Multaq and started amiodarone.  Pt went into NSR today.  They are questionable on starting pt on Eliquis d/t hx of GI bleeds.  Pt will be d/c'ed soon but they plan to stay in [facility] until pt sees surgeon at 2 week f/u appt and will then return to [place].  Advised wife to get records sent to our office.  Advised I will send message to Dr. Rayann Heman to update him on pt.  Wife appreciative for call.

## 2017-03-15 NOTE — Telephone Encounter (Signed)
Please follow-up with spouse. Could consider coumadin for 4 weeks.  Given prior GI bleeding however, it would also be reasonably to not anticoagulate at this time.

## 2017-03-15 NOTE — Telephone Encounter (Signed)
New message   [phone] if no answer on other phone  Per wife patient is admitted in the hospital in [facility] , he has had open heart surgery.  They would like to speak with you about his condition and what treatments they are giving him

## 2017-03-20 ENCOUNTER — Telehealth: Payer: Self-pay | Admitting: Internal Medicine

## 2017-03-20 NOTE — Telephone Encounter (Signed)
Patient c/o Palpitations:  High priority if patient c/o lightheadedness, shortness of breath, or chest pain  1) How long have you had palpitations/irregular HR/ Afib? Are you having the symptoms now? no  2) Are you currently experiencing lightheadedness, SOB or CP? No   3) Do you have a history of afib (atrial fibrillation) or irregular heart rhythm?   4) Have you checked your BP or HR? (document readings if available):   5) Are you experiencing any other symptoms? Elevated BP and low heart rate, patient just completed surgery. Patient has appts scheduled but wife would like to speak with clinical staff in the meantime.

## 2017-03-20 NOTE — Telephone Encounter (Signed)
Spoke with wife pt had cabg x4 last month in [place] and with the med changes heart rate has been running 52-56 Per Wife  Pt is on metoprolol  25 mg bid which was recently decreased from 50 mg bid and is also on Amiodarone 400 mg bid with decreasing in couple of weeks to 200 mg bid the patient has appt with Renee on 03-27-17 at 8:45 am Will forward to Dr Rayann Heman for review .[name]

## 2017-03-26 NOTE — Progress Notes (Deleted)
Cardiology Office Note Date:  03/26/2017  Patient ID:  Jerry Gilbert 11/11/1940, MRN [id number] PCP:  Jerry Mage, MD  Cardiologist:  Dr. Rayann Gilbert  ***refresh   Chief Complaint: post hospital f/u  History of Present Illness: Jerry Gilbert is a 77 y.o. male with history of PAFib/ATach, CAD, ICM, HTN, AO aneurysm (follows with Dr. Oneida Gilbert), smoker, TIA, HLD.  He comes in today to be seen for Dr. Rayann Gilbert, last seen by him in Dec, at that time was without complaints, no changes were made, planned for annual f/u.  While traveling he developed symptoms, and underwent CABG at [facility] in [facility].  While in the Hospital he had post-op AFib his Multaq was resumed though persistent AFib changed to amiodarone resulting on SR.  No a/c was initiated with hx of GIB  *** Meds, a/c, CAD, CM *** labs *** Symptoms *** needs [facility]ologist [facility] *** amio dose *** brady?? *** records *** fluid status   Past Medical History:  Diagnosis Date  . AAA (abdominal aortic aneurysm) ([place]) 11/04/09   3.6 cmx 3.4 cm  . Anemia   . Atrial tachycardia ([place])   . CAD (coronary artery disease)    S/P PCI LAD  . Chaotic atrial rhythm   . Dysrhythmia   . H/O transfusion of packed red blood cells    16 units of blood in 2014 d/t GI bleed  . H/O: GI bleed   . Heart murmur    in 1961  . Hypertension   . Kidney stones   . Myocardial infarction ([place])   . Nocturia   . Personal history of unspecified circulatory disease    Transient Ischemic attacks  . Prostate cancer ([place])   . Shortness of breath dyspnea    'with minimal exertion'  . Stroke [facility])    "small TIA"  . Tobacco abuse     Past Surgical History:  Procedure Laterality Date  . ABDOMINAL AORTIC ANEURYSM REPAIR N/A 12/15/2014   Procedure: ABDOMINAL AORTIC ANEURYSM REPAIR;  Surgeon: Jerry Dutch, MD;  Location: [place];  Service: Vascular;  Laterality: N/A;  . Bare-metal stent     Left circumflex about 10 years ago  .  CARDIAC CATHETERIZATION     11/05/2008  . FEMORAL-POPLITEAL BYPASS GRAFT Left 05/20/2015   Procedure: LEFT COMMON FEMORAL ARTERY ANEURYSM REPAIR;  Surgeon: Jerry Dutch, MD;  Location: [place];  Service: Vascular;  Laterality: Left;  . PERCUTANEOUS CORONARY STENT INTERVENTION (PCI-S)     LAD  . PROSTATE SURGERY    . TONSILLECTOMY      Current Outpatient Medications  Medication Sig Dispense Refill  . aspirin EC 81 MG tablet Take 1 tablet (81 mg total) by mouth daily. 90 tablet 3  . cloNIDine (CATAPRES) 0.2 MG tablet TAKE 1 TABLET BY MOUTH TWICE DAILY 120 tablet 0  . lisinopril (PRINIVIL,ZESTRIL) 20 MG tablet TAKE 1 TABLET BY MOUTH TWICE DAILY *MUST ATTEND APPOINTMENT FOR FURTHER REFILLS* 60 tablet 0  . metoprolol tartrate (LOPRESSOR) 25 MG tablet Take 25 mg by mouth as directed. Take two to three times a day    . metoprolol tartrate (LOPRESSOR) 25 MG tablet TAKE ONE TABLET BY MOUTH THREE TIMES DAILY **GENERIC  FOR  LOPRESSOR** 180 tablet 0  . MULTAQ 400 MG tablet TAKE 1 TABLET BY MOUTH TWICE DAILY WITH MEALS 120 tablet 0  . Multiple Vitamins-Minerals (MULTIVITAMIN WITH MINERALS) tablet Take 1 tablet by mouth daily.    [name]  nitroGLYCERIN (NITROSTAT) 0.4 MG SL tablet Place 1 tablet (0.4 mg total) under the tongue every 5 (five) minutes as needed for chest pain. 25 tablet 12  . pantoprazole (PROTONIX) 40 MG tablet Take 1 tablet (40 mg total) by mouth 2 (two) times daily. 60 tablet 1   No current facility-administered medications for this visit.     Allergies:   Diclofenac; Statins; Warfarin and related; Celecoxib; and Morphine and related   Social History:  The patient  reports that he has been smoking cigarettes.  He has a 80.00 pack-year smoking history. he has never used smokeless tobacco. He reports that he does not drink alcohol or use drugs.   Family History:  The patient's family history includes Arrhythmia in his brother; Coronary artery disease in his brother; Diabetes in his brother;  Heart attack (age of onset: 60) in his mother; Heart disease in his brother, father, and mother; Hyperlipidemia in his brother; Hypertension in his brother.  ROS:  Please see the history of present illness.  All other systems are reviewed and otherwise negative.   PHYSICAL EXAM: *** VS:  There were no vitals taken for this visit. BMI: There is no height or weight on file to calculate BMI. Well nourished, well developed, in no acute distress  HEENT: normocephalic, atraumatic  Neck: no JVD, carotid bruits or masses Cardiac:  *** RRR; no significant murmurs, no rubs, or gallops Lungs:  *** CTA b/l, no wheezing, rhonchi or rales  Abd: soft, nontender MS: no deformity or *** atrophy Ext: *** no edema  Skin: warm and dry, no rash Neuro:  No gross deficits appreciated Psych: euthymic mood, full affect   EKG:  Done today shows ***  Recent Labs: No results found for requested labs within last 8760 hours.  No results found for requested labs within last 8760 hours.   CrCl cannot be calculated (Patient's most recent lab result is older than the maximum 21 days allowed.).   Wt Readings from Last 3 Encounters:  02/15/17 181 lb (82.1 kg)  01/07/16 170 lb 12.8 oz (77.5 kg)  12/03/15 164 lb 9.6 oz (74.7 kg)     Other studies reviewed: Additional studies/records reviewed today include: summarized above  ASSESSMENT AND PLAN:  1. PAFib/ATach     CHA2DS2Vasc is 6     Historically patient had declined a/c and watchman     ***  2. CAD     ***  3. HTN     ***  4. HLD     ***  5. ICM     ***    Disposition: F/u with ***  Current medicines are reviewed at length with the patient today.  The patient did not have any concerns regarding medicines.***  Signed, Jerry Standard, PA-C 03/26/2017 12:11 PM     [facility] [address] [place] [address] [phone] (office)  [phone] (fax)

## 2017-03-27 ENCOUNTER — Ambulatory Visit: Payer: Medicare Other | Admitting: Physician Assistant

## 2017-03-27 ENCOUNTER — Telehealth: Payer: Self-pay | Admitting: Internal Medicine

## 2017-03-27 NOTE — Telephone Encounter (Signed)
Error

## 2017-03-27 NOTE — Telephone Encounter (Signed)
Returned call to patient's wife.  He was scheduled to see an extender and canceled due to not wanting to see that provider.  Was rescheduled to today and could not get here at appointment time due to "fog ice" on roads.  He was given an appointment with Dr Rayann Heman for 1/18 and wishes to keep that appointment. I have asked they bring all the hospital records to the appointment and also let them know that Dr Rayann Heman will be referring them to a genreal cardiologist within our group.  They are fine with this but want to see Dr Rayann Heman.as scheduled.    Both have quit smoking and I congratulated them on this milestone.

## 2017-03-27 NOTE — Telephone Encounter (Signed)
New Message     Please call patient wife wants to go over patients medication list since he had his procedure done.

## 2017-03-31 ENCOUNTER — Ambulatory Visit (INDEPENDENT_AMBULATORY_CARE_PROVIDER_SITE_OTHER): Payer: Medicare Other | Admitting: Internal Medicine

## 2017-03-31 ENCOUNTER — Encounter: Payer: Self-pay | Admitting: Internal Medicine

## 2017-03-31 VITALS — BP 116/70 | HR 52 | Ht 70.5 in | Wt 175.6 lb

## 2017-03-31 DIAGNOSIS — F17200 Nicotine dependence, unspecified, uncomplicated: Secondary | ICD-10-CM

## 2017-03-31 DIAGNOSIS — I119 Hypertensive heart disease without heart failure: Secondary | ICD-10-CM | POA: Diagnosis not present

## 2017-03-31 DIAGNOSIS — I480 Paroxysmal atrial fibrillation: Secondary | ICD-10-CM

## 2017-03-31 DIAGNOSIS — I2510 Atherosclerotic heart disease of native coronary artery without angina pectoris: Secondary | ICD-10-CM | POA: Diagnosis not present

## 2017-03-31 MED ORDER — PANTOPRAZOLE SODIUM 40 MG PO TBEC: 40.0000 mg | DELAYED_RELEASE_TABLET | Freq: Two times a day (BID) | ORAL | 3 refills | Status: DC

## 2017-03-31 MED ORDER — AMIODARONE HCL 200 MG PO TABS: 200.0000 mg | ORAL_TABLET | Freq: Every day | ORAL | 3 refills | Status: DC

## 2017-03-31 MED ORDER — LISINOPRIL 10 MG PO TABS: 10.0000 mg | ORAL_TABLET | Freq: Two times a day (BID) | ORAL | 3 refills | Status: DC

## 2017-03-31 MED ORDER — METOPROLOL TARTRATE 50 MG PO TABS: ORAL_TABLET | ORAL | 3 refills | Status: DC

## 2017-03-31 MED ORDER — CLONIDINE HCL 0.2 MG PO TABS: 0.2000 mg | ORAL_TABLET | Freq: Two times a day (BID) | ORAL | 3 refills | Status: DC

## 2017-03-31 NOTE — Progress Notes (Signed)
PCP: Wyatt Mage, MD   Primary EP: Dr Rayann Heman  Jerry Gilbert is a 77 y.o. male who presents today for routine electrophysiology followup. He underwent CABG while visiting his daughter in [place].  I have reviewed 20 pages of records from [facility] in [place].   He is doing well since his recent CABG.  He has quit smoking!  He is making good post operative recovery.  He did have post op afib for which he is on amiodarone now.  He has frequent elevations of his BP.   Today, he denies symptoms of palpitations, chest pain, shortness of breath,  lower extremity edema, dizziness, presyncope, or syncope.  The patient is otherwise without complaint today.   Past Medical History:  Diagnosis Date  . AAA (abdominal aortic aneurysm) ([place]) 11/04/09   3.6 cmx 3.4 cm  . Anemia   . Atrial tachycardia ([place])   . CAD (coronary artery disease)    S/P PCI LAD  . Chaotic atrial rhythm   . Dysrhythmia   . H/O transfusion of packed red blood cells    16 units of blood in 2014 d/t GI bleed  . H/O: GI bleed   . Heart murmur    in 1961  . Hypertension   . Kidney stones   . Myocardial infarction ([place])   . Nocturia   . Personal history of unspecified circulatory disease    Transient Ischemic attacks  . Prostate cancer ([place])   . Shortness of breath dyspnea    'with minimal exertion'  . Stroke [facility]    "small TIA"  . Tobacco abuse    Past Surgical History:  Procedure Laterality Date  . ABDOMINAL AORTIC ANEURYSM REPAIR N/A 12/15/2014   Procedure: ABDOMINAL AORTIC ANEURYSM REPAIR;  Surgeon: Elam Dutch, MD;  Location: [place];  Service: Vascular;  Laterality: N/A;  . Bare-metal stent     Left circumflex about 10 years ago  . CARDIAC CATHETERIZATION     11/05/2008  . FEMORAL-POPLITEAL BYPASS GRAFT Left 05/20/2015   Procedure: LEFT COMMON FEMORAL ARTERY ANEURYSM REPAIR;  Surgeon: Elam Dutch, MD;  Location: [place];  Service: Vascular;  Laterality: Left;  . PERCUTANEOUS CORONARY STENT  INTERVENTION (PCI-S)     LAD  . PROSTATE SURGERY    . TONSILLECTOMY      ROS- all systems are reviewed and negatives except as per HPI above  Current Outpatient Medications  Medication Sig Dispense Refill  . amiodarone (PACERONE) 200 MG tablet Take 1 tablet by mouth 2 (two) times daily.    [name] aspirin EC 81 MG tablet Take 1 tablet (81 mg total) by mouth daily. 90 tablet 3  . cloNIDine (CATAPRES) 0.2 MG tablet TAKE 1 TABLET BY MOUTH TWICE DAILY 120 tablet 0  . lisinopril (PRINIVIL,ZESTRIL) 10 MG tablet Take 1 tablet by mouth 2 (two) times daily.    [name] lisinopril (PRINIVIL,ZESTRIL) 20 MG tablet TAKE 1 TABLET BY MOUTH TWICE DAILY *MUST ATTEND APPOINTMENT FOR FURTHER REFILLS* 60 tablet 0  . metoprolol tartrate (LOPRESSOR) 25 MG tablet Take 25 mg by mouth as directed. Take two to three times a day    . metoprolol tartrate (LOPRESSOR) 25 MG tablet TAKE ONE TABLET BY MOUTH THREE TIMES DAILY **GENERIC  FOR  LOPRESSOR** 180 tablet 0  . metoprolol tartrate (LOPRESSOR) 50 MG tablet Take 50 mg by mouth as directed. 50 MG IN AM, 25 MG IN PM. SOMETIMES 25 MG IN AM. PER PATIENT    .  MULTAQ 400 MG tablet TAKE 1 TABLET BY MOUTH TWICE DAILY WITH MEALS 120 tablet 0  . Multiple Vitamins-Minerals (MULTIVITAMIN WITH MINERALS) tablet Take 1 tablet by mouth daily.    . nitroGLYCERIN (NITROSTAT) 0.4 MG SL tablet Place 1 tablet (0.4 mg total) under the tongue every 5 (five) minutes as needed for chest pain. 25 tablet 12  . pantoprazole (PROTONIX) 40 MG tablet Take 1 tablet (40 mg total) by mouth 2 (two) times daily. 60 tablet 1   No current facility-administered medications for this visit.     Physical Exam: Vitals:   03/31/17 1141  BP: 116/70  Pulse: (!) 52  SpO2: 97%  Weight: 175 lb 9.6 oz (79.7 kg)  Height: 5' 10.5" (1.791 m)    GEN- The patient is well appearing, alert and oriented x 3 today.   Head- normocephalic, atraumatic Eyes-  Sclera clear, conjunctiva pink Ears- hearing intact Oropharynx-  clear Lungs- Clear to ausculation bilaterally, normal work of breathing Heart- Regular rate and rhythm, no murmurs, rubs or gallops, PMI not laterally displaced GI- soft, NT, ND, + BS Extremities- no clubbing, cyanosis, or edema  EKG tracing ordered today is personally reviewed and shows sinus bradycardia 48 bpm, PR 164 msec, diffuse anterior t wave changes  Assessment and Plan:  1. CAD S/p recent CABG Stable Will refer to general cardiology for long term management Would benefit from cardiac rehab.  Can be discussed upon evaluation by general cardiology.  2. Post operative afib Continue amiodarone Reduce to 200mg  daily in 2 weeks Hopefully we can stop amiodarone in 2 months Given prior massive GI bleed, he is not a candidate for anticoagulation.  He has previously declined [facility].  3. Hypertension Appears stable today No changes  4. Tobacco He has quit!  5. Ischemic CM EF 40% Medical management Repeat echo once recovered from CABG  Thompson Grayer MD, [facility] 03/31/2017 12:02 PM

## 2017-03-31 NOTE — Patient Instructions (Addendum)
Medication Instructions:  Your physician has recommended you make the following change in your medication: 1) Decrease Amiodarone to 200 mg daily on 04/14/17   Labwork: None ordered   Testing/Procedures: None ordered   Follow-Up:  You have been referred to Dr End in 4 weeks for general cardiology    Your physician recommends that you schedule a follow-up appointment in: 8 weeks with Dr Rayann Heman   Any Other Special Instructions Will Be Listed Below (If Applicable).     If you need a refill on your cardiac medications before your next appointment, please call your pharmacy.

## 2017-04-04 ENCOUNTER — Ambulatory Visit: Payer: Medicare Other | Admitting: Nurse Practitioner

## 2017-04-04 ENCOUNTER — Telehealth: Payer: Self-pay | Admitting: Internal Medicine

## 2017-04-04 NOTE — Telephone Encounter (Signed)
Attempted to call back.  After two rings, phone disconnected.  Attempted to call back and phone went straight to VM.  Will try again later.

## 2017-04-04 NOTE — Telephone Encounter (Signed)
New message ° °Pt verbalized that she is returning call for RN °

## 2017-04-04 NOTE — Telephone Encounter (Signed)
New message  Patient wife calling. Please call    Pt c/o Shortness Of Breath: STAT if SOB developed within the last 24 hours or pt is noticeably SOB on the phone  1. Are you currently SOB (can you hear that pt is SOB on the phone)? NO  2. How long have you been experiencing SOB? 1 week, worse  last 3 days  3. Are you SOB when sitting or when up moving around? When laying down  4. Are you currently experiencing any other symptoms? NO

## 2017-04-04 NOTE — Telephone Encounter (Signed)
Left a message for the pts wife (on [place]) to call back and ask for a triage Nurse.

## 2017-04-05 NOTE — Telephone Encounter (Signed)
Follow up    Patient spouse returning call from 04/04/17. Patient still having issue with SOB when laying down. Also needs clarification on how patient should be taking the amiodarone (PACERONE) 200 MG tablet.  Please call

## 2017-04-06 MED ORDER — AMIODARONE HCL 200 MG PO TABS: 200.0000 mg | ORAL_TABLET | Freq: Two times a day (BID) | ORAL | 1 refills | Status: DC

## 2017-04-06 NOTE — Telephone Encounter (Signed)
S[poke with patient's wife and she states that his Amiodarone was only written for 2 weeks at discharge.  He is to decrease his dose to 200 mg daily in February.  I had sent in prescription for this but as he only had 2 weeks will run out of medications as he is taking the pills bid.  I sent over new prescription for bid today.  She also states he had episode where he was SOB during the night and could not sleep.  The next morning she gave him pain medication and he slept for 3 hours and has been better since.  I have discussed this with Dr Rayann Heman and he feels he needs to get in with general cardiology.  He is set to get established with Dr End in the next few weeks.  I let his wife know I would see about moving that appointment up for them.

## 2017-04-06 NOTE — Telephone Encounter (Signed)
New message  Pt wife verbalized that she is calling for the RN  She want the medication changed   Pt c/o medication issue:  1. Name of Medication: amiodarone (PACERONE) 200 MG tablet  2. How are you currently taking this medication (dosage and times per day)? Take 1 tablet (200 mg total) by mouth daily.  3. Are you having a reaction (difficulty breathing--STAT)? no  4. What is your medication issue?  Pt need to take 2 a day and not one, its making him run out of the medication sooner

## 2017-04-06 NOTE — Telephone Encounter (Addendum)
Left message for wife to call me back and have me paged

## 2017-04-11 ENCOUNTER — Telehealth: Payer: Self-pay | Admitting: Internal Medicine

## 2017-04-11 NOTE — Telephone Encounter (Signed)
New message   Patient wife calling with BP concerns.  Pt c/o BP issue: STAT if pt c/o blurred vision, one-sided weakness or slurred speech  1. What are your last 5 BP readings? 180/100  2. Are you having any other symptoms (ex. Dizziness, headache, blurred vision, passed out)? NO  3. What is your BP issue? BP too high

## 2017-04-11 NOTE — Telephone Encounter (Signed)
Patient's wife called to report high blood pressure. This morning, and for the last week or so, BP has run around 180/100. He has no symptoms. She states the blood pressure reading are taken prior to taking medications so she can assess HR before taking Lopressor. 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 Instructed her to palpate the patient's radial pulse prior to taking morning medications and to only check BP 1-2 hours after taking medications.  Per her request, rescheduled appointment with Dr. Saunders Revel to this Friday. She will bring blood pressure log to appointment and will call if BP readings remain elevated.

## 2017-04-14 ENCOUNTER — Encounter: Payer: Self-pay | Admitting: Internal Medicine

## 2017-04-14 ENCOUNTER — Ambulatory Visit (INDEPENDENT_AMBULATORY_CARE_PROVIDER_SITE_OTHER): Payer: Medicare Other | Admitting: Internal Medicine

## 2017-04-14 ENCOUNTER — Ambulatory Visit (INDEPENDENT_AMBULATORY_CARE_PROVIDER_SITE_OTHER)
Admission: RE | Admit: 2017-04-14 | Discharge: 2017-04-14 | Disposition: A | Discharge status: Home / Self Care | Payer: Medicare Other | Source: Ambulatory Visit | Attending: Internal Medicine | Admitting: Internal Medicine

## 2017-04-14 VITALS — BP 150/74 | HR 55 | Resp 16 | Ht 70.0 in | Wt 177.4 lb

## 2017-04-14 DIAGNOSIS — I2510 Atherosclerotic heart disease of native coronary artery without angina pectoris: Secondary | ICD-10-CM

## 2017-04-14 DIAGNOSIS — I255 Ischemic cardiomyopathy: Secondary | ICD-10-CM | POA: Diagnosis not present

## 2017-04-14 DIAGNOSIS — R0602 Shortness of breath: Secondary | ICD-10-CM

## 2017-04-14 DIAGNOSIS — E785 Hyperlipidemia, unspecified: Secondary | ICD-10-CM | POA: Diagnosis not present

## 2017-04-14 DIAGNOSIS — I480 Paroxysmal atrial fibrillation: Secondary | ICD-10-CM

## 2017-04-14 DIAGNOSIS — I10 Essential (primary) hypertension: Secondary | ICD-10-CM

## 2017-04-14 DIAGNOSIS — I5022 Chronic systolic (congestive) heart failure: Secondary | ICD-10-CM | POA: Diagnosis not present

## 2017-04-14 MED ORDER — LISINOPRIL 40 MG PO TABS: 40.0000 mg | ORAL_TABLET | Freq: Two times a day (BID) | ORAL | 3 refills | Status: DC

## 2017-04-14 MED ORDER — LISINOPRIL 20 MG PO TABS: 20.0000 mg | ORAL_TABLET | Freq: Two times a day (BID) | ORAL | 3 refills | Status: DC

## 2017-04-14 NOTE — Patient Instructions (Addendum)
Medication Instructions:   Start Lisinopril 40 mg BY MOUTH TWICE A DAY  -- If you need a refill on your cardiac medications before your next appointment, please call your pharmacy. --  Labwork: CBC; CMP; PRO BNP  Testing/Procedures: A chest x-ray takes a picture of the organs and structures inside the chest, including the heart, lungs, and blood vessels. This test can show several things, including, whether the heart is enlarges; whether fluid is building up in the lungs; and whether pacemaker / defibrillator leads are still in place. TODAY   Your physician has requested that you have an echocardiogram. Echocardiography is a painless test that uses sound waves to create images of your heart. It provides your doctor with information about the size and shape of your heart and how well your heart's chambers and valves are working. This procedure takes approximately one hour. There are no restrictions for this procedure.  PLEASE schedule to be done within 1 week  Follow-Up: Your physician wants you to follow-up in: 1 month with Dr. Saunders Revel.    You will receive a reminder letter in the mail two months in advance. If you don't receive a letter, please call our office to schedule the follow-up appointment.  Thank you for choosing [facility]!!    Any Other Special Instructions Will Be Listed Below (If Applicable).

## 2017-04-14 NOTE — Progress Notes (Signed)
New Outpatient Visit Date: 04/14/2017  Referring Provider: Thompson Grayer, MD [facility] Electrophysiology  Chief Complaint: Shortness of breath  HPI:  Mr. Bebout is a 77 y.o. male who is being seen today for the evaluation of shortness of breath following recent CABG at the request of Dr. Rayann Heman. He has a history of coronary artery disease status post remote PCI's and recent CABG in the setting of NSTEMI while visiting [place] in 02/2017, ischemic cardiomyopathy (LVEF 40% in 08/2015), AAA status post repair, paroxysmal atrial fibrillation, hypertension, hyperlipidemia, and COPD.  Today, Mr. Steinhart complains predominantly of shortness of breath and orthopnea.  This has improved since he left the hospital in [place] in early January, though he still has some days when his shortness of breath seems to be worse.  He has not had any further angina but notes just soreness along the chest wall, especially when coughing.  He denies palpitations, lightheadedness, and edema.  Mr. Baxendale brings blood pressure readings from home, which have been quite elevated (systolic blood pressure up to 190 mmHg).  He notes that he was previously on lisinopril 20 mg twice daily, though this was decreased to 10 mg twice daily at the time of his recent hospital discharge.  He was evaluated by Dr. Rayann Heman on 03/31/17, given that his post CABG course was complicated by atrial fibrillation.  At that visit, he was doing relatively well.  He is not on anticoagulation given history of massive GI bleed.  --------------------------------------------------------------------------------------------------  Cardiovascular History & Procedures: Cardiovascular Problems:  Coronary artery disease status post CABG and PCI  Paroxysmal atrial fibrillation  AAA status post repair  Risk Factors:  Known coronary artery disease, hypertension, hyperlipidemia, tobacco use, male gender, and age greater than  42  Cath/PCI:  None available  CV Surgery:  CABG (03/10/17, [place], [place], [place]): LIMA to LAD, SVG to diagonal, SVG to OM, and SVG to PDA  AAA repair  EP Procedures and Devices:  None  Non-Invasive Evaluation(s):  TTE (09/09/15, [facility]): LVEF 40%.  Moderate mitral regurgitation.  Mild tricuspid regurgitation.  Recent CV Pertinent Labs: Lab Results  Component Value Date   CHOL 199 09/01/2014   HDL 32.60 (L) 09/01/2014   LDLCALC 151 (H) 09/01/2014   LDLDIRECT 174.2 12/19/2011   TRIG 79.0 09/01/2014   CHOLHDL 6 09/01/2014   INR 0.96 05/19/2015   K 4.1 09/02/2015   MG 2.0 12/16/2014   BUN 17 09/02/2015   CREATININE 0.97 09/02/2015    --------------------------------------------------------------------------------------------------  Past Medical History:  Diagnosis Date  . AAA (abdominal aortic aneurysm) ([place]) 11/04/09   3.6 cmx 3.4 cm  . Anemia   . Atrial tachycardia ([place])   . CAD (coronary artery disease)    S/P PCI LAD  . Chaotic atrial rhythm   . Dysrhythmia   . H/O transfusion of packed red blood cells    16 units of blood in 2014 d/t GI bleed  . H/O: GI bleed   . Heart murmur    in 1961  . Hypertension   . Kidney stones   . Myocardial infarction ([place])   . Nocturia   . Personal history of unspecified circulatory disease    Transient Ischemic attacks  . Prostate cancer ([place])   . Shortness of breath dyspnea    'with minimal exertion'  . Stroke [facility])    "small TIA"  . Tobacco abuse     Past Surgical History:  Procedure Laterality Date  . ABDOMINAL AORTIC ANEURYSM  REPAIR N/A 12/15/2014   Procedure: ABDOMINAL AORTIC ANEURYSM REPAIR;  Surgeon: Elam Dutch, MD;  Location: [place];  Service: Vascular;  Laterality: N/A;  . Bare-metal stent     Left circumflex about 10 years ago  . CARDIAC CATHETERIZATION     11/05/2008  . FEMORAL-POPLITEAL BYPASS GRAFT Left 05/20/2015   Procedure: LEFT COMMON FEMORAL ARTERY ANEURYSM REPAIR;   Surgeon: Elam Dutch, MD;  Location: [place];  Service: Vascular;  Laterality: Left;  . PERCUTANEOUS CORONARY STENT INTERVENTION (PCI-S)     LAD  . PROSTATE SURGERY    . TONSILLECTOMY      Current Meds  Medication Sig  . amiodarone (PACERONE) 200 MG tablet Take 1 tablet (200 mg total) by mouth 2 (two) times daily. (Patient taking differently: Take 200 mg by mouth daily. )  . aspirin EC 81 MG tablet Take 1 tablet (81 mg total) by mouth daily.  . cloNIDine (CATAPRES) 0.2 MG tablet Take 1 tablet (0.2 mg total) by mouth 2 (two) times daily.  [name] lisinopril (PRINIVIL,ZESTRIL) 10 MG tablet Take 1 tablet (10 mg total) by mouth 2 (two) times daily.  . metoprolol tartrate (LOPRESSOR) 50 MG tablet TAKE 1 TAB (50 MG) BY MOUTH IN AM AND TAKE 1/2 TAB (25 MG) IN PM (Patient taking differently: TAKE 1 TAB (50 MG) BY MOUTH)  . Multiple Vitamins-Minerals (MULTIVITAMIN WITH MINERALS) tablet Take 1 tablet by mouth daily.  . nitroGLYCERIN (NITROSTAT) 0.4 MG SL tablet Place 1 tablet (0.4 mg total) under the tongue every 5 (five) minutes as needed for chest pain.  . pantoprazole (PROTONIX) 40 MG tablet Take 1 tablet (40 mg total) by mouth 2 (two) times daily.    Allergies: Diclofenac; Statins; Warfarin and related; Celecoxib; and Morphine and related  Social History   Socioeconomic History  . Marital status: Married    Spouse name: Not on file  . Number of children: Not on file  . Years of education: Not on file  . Highest education level: Not on file  Social Needs  . Financial resource strain: Not on file  . Food insecurity - worry: Not on file  . Food insecurity - inability: Not on file  . Transportation needs - medical: Not on file  . Transportation needs - non-medical: Not on file  Occupational History  . Occupation: Retired  Tobacco Use  . Smoking status: Former Smoker    Packs/day: 1.00    Years: 80.00    Pack years: 80.00    Types: Cigarettes    Last attempt to quit: 03/06/2017    Years  since quitting: 0.1  . Smokeless tobacco: Never Used  Substance and Sexual Activity  . Alcohol use: No    Alcohol/week: 0.0 oz    Comment: " No alcohol in over a year."  . Drug use: No    Comment: No illicit drug use and only takes multivitamin as far as herbal medication goes.  . Sexual activity: Not on file  Other Topics Concern  . Not on file  Social History Narrative   Lives in [place], [place] with his wife   Remains active, working around his house   Diet is regular     Family History  Problem Relation Age of Onset  . Heart attack Mother 72  . Heart disease Mother        < 102  . Coronary artery disease Brother   . Diabetes Brother   . Heart disease Brother  CABG  . Hyperlipidemia Brother   . Hypertension Brother   . Heart disease Father   . Arrhythmia Brother        S/P cardioversion    Review of Systems: A 12-system review of systems was performed and was negative except as noted in the HPI.  --------------------------------------------------------------------------------------------------  Physical Exam: BP (!) 150/74   Pulse (!) 55   Resp 16   Ht 5\' 10"  (1.778 m)   Wt 177 lb 6.4 oz (80.5 kg)   SpO2 97%   BMI 25.45 kg/m    General: Thin, chronically ill-appearing man, seated comfortably in the exam room.  He is accompanied by his wife. HEENT: Mild conjunctival pallor noted.  Moist mucous membranes. Neck: Supple without lymphadenopathy, thyromegaly, JVD, or HJR. No carotid bruit. Lungs: Normal work of breathing.  Mildly diminished breath sounds throughout without wheezes or crackles. Heart: Regular rate and rhythm without murmurs, rubs, or gallops. Non-displaced PMI. Abd: Bowel sounds present. Soft, NT/ND without hepatosplenomegaly Ext: No lower extremity edema. Radial, PT, and DP pulses are 2+ bilaterally Skin: Warm and dry without rash.  Median sternotomy incision well approximated with granulation tissue. Neuro: CNIII-XII intact. Strength  and fine-touch sensation intact in upper and lower extremities bilaterally. Psych: Normal mood and affect.  EKG (03/31/17):  Sinus bradycardia with left axis deviation, incomplete left bundle branch block, and anterolateral ST/T changes.  Lab Results  Component Value Date   WBC 8.2 09/02/2015   HGB 12.1 (L) 09/02/2015   HCT 38.0 (L) 09/02/2015   MCV 78.8 (L) 09/02/2015   PLT 215 09/02/2015    Lab Results  Component Value Date   NA 139 09/02/2015   K 4.1 09/02/2015   CL 104 09/02/2015   CO2 26 09/02/2015   BUN 17 09/02/2015   CREATININE 0.97 09/02/2015   GLUCOSE 74 09/02/2015   ALT 16 (L) 05/19/2015    Lab Results  Component Value Date   CHOL 199 09/01/2014   HDL 32.60 (L) 09/01/2014   LDLCALC 151 (H) 09/01/2014   LDLDIRECT 174.2 12/19/2011   TRIG 79.0 09/01/2014   CHOLHDL 6 09/01/2014    --------------------------------------------------------------------------------------------------  ASSESSMENT AND PLAN: Coronary artery disease without angina Mr. Vossler is gradually recovering from his CABG in the setting of NSTEMI in late 02/2017.  His shortness of breath is likely multifactorial.  We will work this up further, as detailed below.  We will continue with low-dose aspirin and metoprolol for secondary prevention.  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  Outside records reviewed today do not show what LVEF was around the time of recent CABG.  Mr. Murri appears euvolemic on exam, though certainly heart failure superimposed on COPD could be contributing to his shortness of breath and orthopnea.  I will check a proBNP today as well as a chest radiograph to help guide the need for diuresis.  We will plan to obtain an echocardiogram when Mr. Montini returns for follow-up.  Paroxysmal atrial fibrillation Heart rate is regular today.  Recent EKG at visit with Dr. Rayann Heman showed  sinus bradycardia.  We will continue current doses of amiodarone and metoprolol.  Anticoagulation is being deferred due to history of massive GI bleed.  Hypertension Blood pressure is poorly controlled at home and at least modestly elevated today.  I wonder if this could be contributing to Mr. Hellmer' shortness of breath.  We have agreed to increase lisinopril to  20 mg twice daily.  I will check a complete metabolic panel today and a basic metabolic panel in about 2 weeks.  Hyperlipidemia No recent labs in our system, though LDL has been elevated in the past.  Mr. Mcgarvey is not currently on any lipid therapy given history of intolerance to statins.  We will need to readdress retrial of statin versus PCSK9 inhibitor therapy when he returns for follow-up.  Follow-up: Return to clinic in 1 month.  Nelva Bush, MD 04/14/2017 3:20 PM

## 2017-04-15 ENCOUNTER — Encounter: Payer: Self-pay | Admitting: Internal Medicine

## 2017-04-15 DIAGNOSIS — I5022 Chronic systolic (congestive) heart failure: Secondary | ICD-10-CM | POA: Insufficient documentation

## 2017-04-15 DIAGNOSIS — I255 Ischemic cardiomyopathy: Secondary | ICD-10-CM | POA: Insufficient documentation

## 2017-04-15 LAB — COMPREHENSIVE METABOLIC PANEL
A/G RATIO: 1.6 (ref 1.2–2.2)
ALBUMIN: 4.2 g/dL (ref 3.5–4.8)
ALT: 22 IU/L (ref 0–44)
AST: 14 IU/L (ref 0–40)
Alkaline Phosphatase: 141 IU/L — ABNORMAL HIGH (ref 39–117)
BUN / CREAT RATIO: 15 (ref 10–24)
BUN: 17 mg/dL (ref 8–27)
Bilirubin Total: 0.2 mg/dL (ref 0.0–1.2)
CALCIUM: 9.2 mg/dL (ref 8.6–10.2)
CO2: 26 mmol/L (ref 20–29)
Chloride: 99 mmol/L (ref 96–106)
Creatinine, Ser: 1.14 mg/dL (ref 0.76–1.27)
GFR, EST AFRICAN AMERICAN: 72 mL/min/{1.73_m2} (ref >59)
GFR, EST NON AFRICAN AMERICAN: 63 mL/min/{1.73_m2} (ref >59)
Globulin, Total: 2.6 g/dL (ref 1.5–4.5)
Glucose: 81 mg/dL (ref 65–99)
Potassium: 4.2 mmol/L (ref 3.5–5.2)
Sodium: 139 mmol/L (ref 134–144)
TOTAL PROTEIN: 6.8 g/dL (ref 6.0–8.5)

## 2017-04-15 LAB — CBC WITH DIFFERENTIAL/PLATELET
Basophils Absolute: 0 10*3/uL (ref 0.0–0.2)
Basos: 0 %
EOS (ABSOLUTE): 0.2 10*3/uL (ref 0.0–0.4)
Eos: 2 %
HEMOGLOBIN: 11 g/dL — AB (ref 13.0–17.7)
Hematocrit: 35.4 % — ABNORMAL LOW (ref 37.5–51.0)
IMMATURE GRANS (ABS): 0 10*3/uL (ref 0.0–0.1)
IMMATURE GRANULOCYTES: 0 %
LYMPHS: 31 %
Lymphocytes Absolute: 2.9 10*3/uL (ref 0.7–3.1)
MCH: 25.8 pg — ABNORMAL LOW (ref 26.6–33.0)
MCHC: 31.1 g/dL — ABNORMAL LOW (ref 31.5–35.7)
MCV: 83 fL (ref 79–97)
MONOCYTES: 9 %
Monocytes Absolute: 0.8 10*3/uL (ref 0.1–0.9)
NEUTROS ABS: 5.4 10*3/uL (ref 1.4–7.0)
NEUTROS PCT: 58 %
PLATELETS: 303 10*3/uL (ref 150–379)
RBC: 4.27 x10E6/uL (ref 4.14–5.80)
RDW: 16.1 % — ABNORMAL HIGH (ref 12.3–15.4)
WBC: 9.3 10*3/uL (ref 3.4–10.8)

## 2017-04-15 LAB — PRO B NATRIURETIC PEPTIDE: NT-Pro BNP: 2007 pg/mL — ABNORMAL HIGH (ref 0–486)

## 2017-04-17 ENCOUNTER — Ambulatory Visit: Payer: Medicare Other | Admitting: Internal Medicine

## 2017-04-21 ENCOUNTER — Other Ambulatory Visit (HOSPITAL_COMMUNITY): Payer: Medicare Other

## 2017-05-02 ENCOUNTER — Telehealth: Payer: Self-pay | Admitting: Internal Medicine

## 2017-05-02 MED ORDER — POTASSIUM CHLORIDE CRYS ER 20 MEQ PO TBCR: 20.0000 meq | EXTENDED_RELEASE_TABLET | Freq: Every day | ORAL | 3 refills | Status: DC

## 2017-05-02 MED ORDER — FUROSEMIDE 20 MG PO TABS: 20.0000 mg | ORAL_TABLET | Freq: Every day | ORAL | 3 refills | Status: DC

## 2017-05-02 NOTE — Telephone Encounter (Signed)
Jerry Gilbert is calling to make sure that Dr. Saunders Revel received the lab work that he had done in [place]. Also to speak about his blood pressure . Please call back on either home or cell #

## 2017-05-02 NOTE — Addendum Note (Signed)
Addended by: Derl Barrow on: 05/02/2017 02:32 PM   Modules accepted: Orders

## 2017-05-02 NOTE — Telephone Encounter (Signed)
Pt called back and stated that he wanted his medication sent to a different pharmacy. I resent pt's medication to his pharmacy as requested. Confirmation received.

## 2017-05-02 NOTE — Telephone Encounter (Signed)
Notes recorded by Nelva Bush, MD on 05/02/2017 at 1:51 PM EST Please let Jerry Gilbert know that I have reviewed his labs from [facility]. His creatinine stable. His potassium is slightly lower but still normal. Given continued shortness of breath and orthopnea, I recommend that we started furosemide 20 mg PO daily and potassium chloride 20 mEq daily. He should continue the remainder of his medications. We will recheck a BMP when Jerry Gilbert returns to see me in the office on 3/7.     Spoke with wife and went over results and recommendations per Dr. Saunders Revel.  Advised if any issues after starting this medication, please contact the office.  Wife verbalized understanding and was in agreement with this plan.

## 2017-05-02 NOTE — Telephone Encounter (Signed)
We will review labs and contact the patient to discuss further medication changes. If his shortness of breath worsens, he should go to the ED for further evaluation.  Nelva Bush, MD [facility] Pager: [phone]

## 2017-05-02 NOTE — Telephone Encounter (Signed)
Spoke with representative from the hospital and she will fax labs now.

## 2017-05-02 NOTE — Telephone Encounter (Signed)
Spoke with wife, DPR on file.  Pt has labs drawn this past Friday at [facility] in [place], [place].  We have not received these yet.  Will reach out to them to see if I can obtain these.  Wife also mentioned that they have been monitoring pt's BP since Lisinopril was increased to 40mg  BID and it is still high.  Pt takes meds at 7AM and they check BP between 11-12.  Readings are as follows: 160/90, 170/100, 150/80, 140/80, 180/100, 180/90 and 160/80.  HR normally 42-56.  Pt still having trouble laying flat.  It has improved since 2 weeks ago but still feels like he can't breathe when he lays flat.  Advised I will send message to Dr. Saunders Revel for review.

## 2017-05-05 ENCOUNTER — Ambulatory Visit: Payer: Medicare Other | Admitting: Internal Medicine

## 2017-05-18 ENCOUNTER — Ambulatory Visit (HOSPITAL_COMMUNITY): Payer: Medicare Other | Attending: Cardiology

## 2017-05-18 ENCOUNTER — Ambulatory Visit (INDEPENDENT_AMBULATORY_CARE_PROVIDER_SITE_OTHER): Payer: Medicare Other | Admitting: Internal Medicine

## 2017-05-18 ENCOUNTER — Encounter: Payer: Self-pay | Admitting: Internal Medicine

## 2017-05-18 ENCOUNTER — Other Ambulatory Visit: Payer: Self-pay

## 2017-05-18 VITALS — BP 126/88 | HR 50 | Ht 70.0 in | Wt 171.1 lb

## 2017-05-18 DIAGNOSIS — R000 Tachycardia, unspecified: Secondary | ICD-10-CM | POA: Diagnosis not present

## 2017-05-18 DIAGNOSIS — I2510 Atherosclerotic heart disease of native coronary artery without angina pectoris: Secondary | ICD-10-CM

## 2017-05-18 DIAGNOSIS — Z87891 Personal history of nicotine dependence: Secondary | ICD-10-CM | POA: Insufficient documentation

## 2017-05-18 DIAGNOSIS — G459 Transient cerebral ischemic attack, unspecified: Secondary | ICD-10-CM | POA: Diagnosis not present

## 2017-05-18 DIAGNOSIS — I480 Paroxysmal atrial fibrillation: Secondary | ICD-10-CM

## 2017-05-18 DIAGNOSIS — I10 Essential (primary) hypertension: Secondary | ICD-10-CM

## 2017-05-18 DIAGNOSIS — I5022 Chronic systolic (congestive) heart failure: Secondary | ICD-10-CM

## 2017-05-18 DIAGNOSIS — R0602 Shortness of breath: Secondary | ICD-10-CM | POA: Diagnosis present

## 2017-05-18 DIAGNOSIS — I252 Old myocardial infarction: Secondary | ICD-10-CM | POA: Diagnosis not present

## 2017-05-18 DIAGNOSIS — Z8249 Family history of ischemic heart disease and other diseases of the circulatory system: Secondary | ICD-10-CM | POA: Diagnosis not present

## 2017-05-18 DIAGNOSIS — I255 Ischemic cardiomyopathy: Secondary | ICD-10-CM

## 2017-05-18 DIAGNOSIS — E785 Hyperlipidemia, unspecified: Secondary | ICD-10-CM | POA: Diagnosis not present

## 2017-05-18 DIAGNOSIS — I340 Nonrheumatic mitral (valve) insufficiency: Secondary | ICD-10-CM | POA: Insufficient documentation

## 2017-05-18 DIAGNOSIS — R011 Cardiac murmur, unspecified: Secondary | ICD-10-CM | POA: Insufficient documentation

## 2017-05-18 LAB — ECHOCARDIOGRAM COMPLETE
Height: 70 in
WEIGHTICAEL: 2737.92 [oz_av]

## 2017-05-18 MED ORDER — METOPROLOL TARTRATE 25 MG PO TABS: 12.5000 mg | ORAL_TABLET | Freq: Two times a day (BID) | ORAL | 3 refills | Status: DC

## 2017-05-18 NOTE — Patient Instructions (Addendum)
Medication Instructions:  START Metoprolol Tartrate 12.5 mg twice per day  -- If you need a refill on your cardiac medications before your next appointment, please call your pharmacy. --  Labwork: BMET/LIPID PANEL  Testing/Procedures: None ordered  Follow-Up: Your physician wants you to follow-up in: 2 MONTHS with Dr. Saunders Revel   Thank you for choosing [facility]!!    Any Other Special Instructions Will Be Listed Below (If Applicable).

## 2017-05-18 NOTE — Progress Notes (Signed)
Follow-up Outpatient Visit Date: 05/18/2017  Primary Care Provider: Wyatt Mage, MD [facility] [address] [place] [address]  Chief Complaint: Cough  HPI:  Jerry Gilbert is a 77 y.o. year-old male with history of coronary artery disease status post CABG (02/2017), ischemic cardiomyopathy (LVEF 40% in 08/2015), AAA status post repair, paroxysmal atrial fibrillation, hypertension, hyperlipidemia, and COPD, who presents for follow-up of heart failure. I met Jerry Gilbert on 04/14/17, which time he complained of shortness of breath and orthopnea as well as uncontrolled hypertension. We decided to increase lisinopril and subsequently added furosemide.  Today, Jerry Gilbert reports that his orthopnea and cough have improved, though they are still present. Overall, he is feeling much better. He notes some soreness along his median sternotomy. He denies anginal chest pain, palpitations, lightheadedness, and edema. He is urinating frequently since starting furosemide. His wife continues to check Jerry Gilbert' blood pressure daily. It remains elevated (ranging from 720-947 systolic), though the overall trend is getting better. Some low heart rates notes at home. Some days, he will cut his morning dose of metoprolol in half.  --------------------------------------------------------------------------------------------------  Cardiovascular History & Procedures: Cardiovascular Problems:  Coronary artery disease status post CABG and PCI  Paroxysmal atrial fibrillation  AAA status post repair  Risk Factors:  Known coronary artery disease, hypertension, hyperlipidemia, tobacco use, male gender, and age greater than 34  Cath/PCI:  None available  CV Surgery:  CABG (03/10/17, [facility] [place], [place]): LIMA to LAD, SVG to diagonal, SVG to OM, and SVG to PDA  AAA repair  EP Procedures and Devices:  None  Non-Invasive Evaluation(s):  TTE (09/09/15, [facility]): LVEF 40%.  Moderate mitral  regurgitation.  Mild tricuspid regurgitation.  Recent CV Pertinent Labs: Lab Results  Component Value Date   CHOL 199 09/01/2014   HDL 32.60 (L) 09/01/2014   LDLCALC 151 (H) 09/01/2014   LDLDIRECT 174.2 12/19/2011   TRIG 79.0 09/01/2014   CHOLHDL 6 09/01/2014   INR 0.96 05/19/2015   K 4.2 04/14/2017   MG 2.0 12/16/2014   BUN 17 04/14/2017   CREATININE 1.14 04/14/2017   CREATININE 0.97 09/02/2015    Past medical and surgical history were reviewed and updated in EPIC.  Current Meds  Medication Sig  . amiodarone (PACERONE) 200 MG tablet Take 200 mg by mouth daily.  [name] aspirin EC 81 MG tablet Take 1 tablet (81 mg total) by mouth daily.  . cloNIDine (CATAPRES) 0.2 MG tablet Take 1 tablet (0.2 mg total) by mouth 2 (two) times daily.  . furosemide (LASIX) 20 MG tablet Take 1 tablet (20 mg total) by mouth daily.  [name] HYDROcodone-acetaminophen (NORCO/VICODIN) 5-325 MG tablet Take 1 tablet by mouth every 6 (six) hours as needed for pain.  [name] lisinopril (PRINIVIL,ZESTRIL) 20 MG tablet Take 1 tablet (20 mg total) by mouth 2 (two) times daily.  . metoprolol tartrate (LOPRESSOR) 50 MG tablet Take 50 mg by mouth 2 (two) times daily. Take one tablet (50 mg) by mouth in the am bid and 1/2 tablet (25 mg) in pm.  . Multiple Vitamins-Minerals (MULTIVITAMIN WITH MINERALS) tablet Take 1 tablet by mouth daily.  . nitroGLYCERIN (NITROSTAT) 0.4 MG SL tablet Place 1 tablet (0.4 mg total) under the tongue every 5 (five) minutes as needed for chest pain.  . pantoprazole (PROTONIX) 40 MG tablet Take 1 tablet (40 mg total) by mouth 2 (two) times daily.  . potassium chloride SA (K-DUR,KLOR-CON) 20 MEQ tablet Take 1 tablet (20 mEq total)  by mouth daily.  . traMADol (ULTRAM) 50 MG tablet Take 50 mg by mouth as needed for pain.    Allergies: Diclofenac; Statins; Warfarin and related; Celecoxib; and Morphine and related  Social History   Socioeconomic History  . Marital status: Married    Spouse name: Not on  file  . Number of children: Not on file  . Years of education: Not on file  . Highest education level: Not on file  Social Needs  . Financial resource strain: Not on file  . Food insecurity - worry: Not on file  . Food insecurity - inability: Not on file  . Transportation needs - medical: Not on file  . Transportation needs - non-medical: Not on file  Occupational History  . Occupation: Retired  Tobacco Use  . Smoking status: Former Smoker    Packs/day: 1.00    Years: 80.00    Pack years: 80.00    Types: Cigarettes    Last attempt to quit: 03/06/2017    Years since quitting: 0.2  . Smokeless tobacco: Never Used  Substance and Sexual Activity  . Alcohol use: No    Alcohol/week: 0.0 oz    Comment: " No alcohol in over a year."  . Drug use: No    Comment: No illicit drug use and only takes multivitamin as far as herbal medication goes.  . Sexual activity: Not on file  Other Topics Concern  . Not on file  Social History Narrative   Lives in [place], [place] with his wife   Remains active, working around his house   Diet is regular     Family History  Problem Relation Age of Onset  . Heart attack Mother 24  . Heart disease Mother        < 47  . Coronary artery disease Brother   . Diabetes Brother   . Heart disease Brother        CABG  . Hyperlipidemia Brother   . Hypertension Brother   . Heart disease Father   . Arrhythmia Brother        S/P cardioversion    Review of Systems: A 12-system review of systems was performed and was negative except as noted in the HPI.  --------------------------------------------------------------------------------------------------  Physical Exam: BP 126/88   Pulse (!) 50   Ht _0  (1.778 m)   Wt 171 lb 1.9 oz (77.6 kg)   SpO2 99%   BMI 24.55 kg/m   General:  NAD HEENT: No conjunctival pallor or scleral icterus. Moist mucous membranes.  OP clear. Neck: Supple without lymphadenopathy, thyromegaly, JVD, or HJR. No  carotid bruit. Lungs: Normal work of breathing. Clear to auscultation bilaterally without wheezes or crackles. Heart: Bradycardic but regular rate and rhythm with 2/6 systolic murmur at LLSB. No rubs or gallops. Non-displaced PMI. Abd: Bowel sounds present. Soft, NT/ND without hepatosplenomegaly Ext: Trace pretibial edema bilaterally. Skin: Warm and dry without rash.  EKG:  Sinus bradycardia with LAFB and anterolateral ST/T changes. No significant change from prior tracing on 03/31/17.  Lab Results  Component Value Date   WBC 9.3 04/14/2017   HGB 11.0 (L) 04/14/2017   HCT 35.4 (L) 04/14/2017   MCV 83 04/14/2017   PLT 303 04/14/2017    Lab Results  Component Value Date   NA 139 04/14/2017   K 4.2 04/14/2017   CL 99 04/14/2017   CO2 26 04/14/2017   BUN 17 04/14/2017   CREATININE 1.14 04/14/2017   GLUCOSE 81 04/14/2017  ALT 22 04/14/2017    Lab Results  Component Value Date   CHOL 199 09/01/2014   HDL 32.60 (L) 09/01/2014   LDLCALC 151 (H) 09/01/2014   LDLDIRECT 174.2 12/19/2011   TRIG 79.0 09/01/2014   CHOLHDL 6 09/01/2014    --------------------------------------------------------------------------------------------------  ASSESSMENT AND PLAN: Coronary artery disease without angina Jerry Gilbert continues to improve following CABG in 02/2017. We will continue with ASA, metoprolol, and rosuvastatin, though I will have him cut down on his metoprolol to 12.5 mg BID due to persistent bradycardia. We will refer him to cardiac rehab near his home in [place].  Chronic systolic heart failure due to ischemic cardiomyopathy Volume status appears improved on exam today. Orthopnea is also better. He continues to have NYHA class III symptoms. We will continue furosemide 20 mg daily and recheck a BMP today. Echo will also be performed later today. If LVEF is severely reduced, we will need to consider adding spironolactone and/or transitioning to [facility]. ICD would also need to be  considered for primary prevention of sudden cardiac death.  Paroxysmal atrial fibrillation Sinus bradycardia again noted today. I will decrease metoprolol tartrate to 12.5 mg BID and continue amiodarone 200 mg daily. Jerry Gilbert is scheduled for follow-up with Dr. Rayann Heman in a few weeks.  Hypertension Blood pressure is well-controlled in the office today, though Jerry Gilbert' wife has been getting much higher readings at home. I have asked him to continue to monitor his blood pressure closely and to let us know if it remains consistently elevated. No medication changes today.  Hyperlipidemia Jerry Gilbert has been intolerant of several statins. However, he needs aggressive lipid therapy in light of his CAD with recent CABG and AAA status post repair. I will recheck a lipid panel today, and if his LDL remains above 70, rechallenge him with rosuvastatin 5 mg on Monday, Wednesday, and Friday.  Follow-up: Return to clinic in 2 months.  Jerry Bush, MD 05/18/2017 11:37 AM

## 2017-05-19 ENCOUNTER — Other Ambulatory Visit: Payer: Self-pay

## 2017-05-19 ENCOUNTER — Encounter: Payer: Self-pay | Admitting: Internal Medicine

## 2017-05-19 DIAGNOSIS — E785 Hyperlipidemia, unspecified: Secondary | ICD-10-CM

## 2017-05-19 LAB — LIPID PANEL
CHOLESTEROL TOTAL: 262 mg/dL — AB (ref 100–199)
Chol/HDL Ratio: 6.4 ratio — ABNORMAL HIGH (ref 0.0–5.0)
HDL: 41 mg/dL (ref >39)
LDL CALC: 183 mg/dL — AB (ref 0–99)
TRIGLYCERIDES: 191 mg/dL — AB (ref 0–149)
VLDL Cholesterol Cal: 38 mg/dL (ref 5–40)

## 2017-05-19 LAB — BASIC METABOLIC PANEL
BUN/Creatinine Ratio: 13 (ref 10–24)
BUN: 16 mg/dL (ref 8–27)
CALCIUM: 9.1 mg/dL (ref 8.6–10.2)
CO2: 26 mmol/L (ref 20–29)
Chloride: 96 mmol/L (ref 96–106)
Creatinine, Ser: 1.22 mg/dL (ref 0.76–1.27)
GFR calc Af Amer: 67 mL/min/{1.73_m2} (ref >59)
GFR calc non Af Amer: 58 mL/min/{1.73_m2} — ABNORMAL LOW (ref >59)
GLUCOSE: 100 mg/dL — AB (ref 65–99)
Potassium: 4.5 mmol/L (ref 3.5–5.2)
SODIUM: 137 mmol/L (ref 134–144)

## 2017-05-19 MED ORDER — ROSUVASTATIN CALCIUM 5 MG PO TABS: ORAL_TABLET | ORAL | 3 refills | Status: DC

## 2017-05-22 ENCOUNTER — Other Ambulatory Visit: Payer: Self-pay | Admitting: Internal Medicine

## 2017-05-22 MED ORDER — NITROGLYCERIN 0.4 MG SL SUBL: 0.4000 mg | SUBLINGUAL_TABLET | SUBLINGUAL | 6 refills | Status: DC | PRN

## 2017-05-31 ENCOUNTER — Encounter: Payer: Self-pay | Admitting: Internal Medicine

## 2017-05-31 ENCOUNTER — Ambulatory Visit (INDEPENDENT_AMBULATORY_CARE_PROVIDER_SITE_OTHER): Payer: Medicare Other | Admitting: Internal Medicine

## 2017-05-31 VITALS — BP 126/78 | HR 81 | Ht 69.0 in | Wt 175.0 lb

## 2017-05-31 DIAGNOSIS — I10 Essential (primary) hypertension: Secondary | ICD-10-CM | POA: Diagnosis not present

## 2017-05-31 DIAGNOSIS — I255 Ischemic cardiomyopathy: Secondary | ICD-10-CM

## 2017-05-31 DIAGNOSIS — I480 Paroxysmal atrial fibrillation: Secondary | ICD-10-CM | POA: Diagnosis not present

## 2017-05-31 DIAGNOSIS — I25118 Atherosclerotic heart disease of native coronary artery with other forms of angina pectoris: Secondary | ICD-10-CM

## 2017-05-31 DIAGNOSIS — Z951 Presence of aortocoronary bypass graft: Secondary | ICD-10-CM | POA: Diagnosis not present

## 2017-05-31 MED ORDER — NITROGLYCERIN 0.4 MG SL SUBL: 0.4000 mg | SUBLINGUAL_TABLET | SUBLINGUAL | 6 refills | Status: DC | PRN

## 2017-05-31 MED ORDER — CARVEDILOL 6.25 MG PO TABS: 6.2500 mg | ORAL_TABLET | Freq: Two times a day (BID) | ORAL | 3 refills | Status: DC

## 2017-05-31 NOTE — Patient Instructions (Addendum)
Medication Instructions:  Your physician has recommended you make the following change in your medication:  1.  Stop taking metoprolol 2.  Start taking carvedilol 6.25 mg one tablet by mouth twice a day.  Please do some research on Entresto.  Labwork: None ordered.  Testing/Procedures: None ordered.  Follow-Up: Your physician wants you to follow-up in: 6 months with Dr. Rayann Heman.   You will receive a reminder letter in the mail two months in advance. If you don't receive a letter, please call our office to schedule the follow-up appointment.  Any Other Special Instructions Will Be Listed Below (If Applicable).  If you need a refill on your cardiac medications before your next appointment, please call your pharmacy.  Carvedilol tablets What is this medicine? CARVEDILOL (KAR ve dil ol) is a beta-blocker. Beta-blockers reduce the workload on the heart and help it to beat more regularly. This medicine is used to treat high blood pressure and heart failure. This medicine may be used for other purposes; ask your health care provider or pharmacist if you have questions. COMMON BRAND NAME(S): Coreg What should I tell my health care provider before I take this medicine? They need to know if you have any of these conditions: -circulation problems -diabetes -history of heart attack or heart disease -liver disease -lung or breathing disease, like asthma or emphysema -pheochromocytoma -slow or irregular heartbeat -thyroid disease -an unusual or allergic reaction to carvedilol, other beta-blockers, medicines, foods, dyes, or preservatives -pregnant or trying to get pregnant -breast-feeding How should I use this medicine? Take this medicine by mouth with a glass of water. Follow the directions on the prescription label. It is best to take the tablets with food. Take your doses at regular intervals. Do not take your medicine more often than directed. Do not stop taking except on the advice of  your doctor or health care professional. Talk to your pediatrician regarding the use of this medicine in children. Special care may be needed. Overdosage: If you think you have taken too much of this medicine contact a poison control center or emergency room at once. NOTE: This medicine is only for you. Do not share this medicine with others. What if I miss a dose? If you miss a dose, take it as soon as you can. If it is almost time for your next dose, take only that dose. Do not take double or extra doses. What may interact with this medicine? This medicine may interact with the following medications: -certain medicines for blood pressure, heart disease, irregular heart beat -certain medicines for depression, like fluoxetine or paroxetine -certain medicines for diabetes, like glipizide or glyburide -cimetidine -clonidine -cyclosporine -digoxin -MAOIs like Carbex, Eldepryl, Marplan, Nardil, and Parnate -reserpine -rifampin This list may not describe all possible interactions. Give your health care provider a list of all the medicines, herbs, non-prescription drugs, or dietary supplements you use. Also tell them if you smoke, drink alcohol, or use illegal drugs. Some items may interact with your medicine. What should I watch for while using this medicine? Check your heart rate and blood pressure regularly while you are taking this medicine. Ask your doctor or health care professional what your heart rate and blood pressure should be, and when you should contact him or her. Do not stop taking this medicine suddenly. This could lead to serious heart-related effects. Contact your doctor or health care professional if you have difficulty breathing while taking this drug. Check your weight daily. Ask your doctor or health care  professional when you should notify him/her of any weight gain. You may get drowsy or dizzy. Do not drive, use machinery, or do anything that requires mental alertness until  you know how this medicine affects you. To reduce the risk of dizzy or fainting spells, do not sit or stand up quickly. Alcohol can make you more drowsy, and increase flushing and rapid heartbeats. Avoid alcoholic drinks. If you have diabetes, check your blood sugar as directed. Tell your doctor if you have changes in your blood sugar while you are taking this medicine. If you are going to have surgery, tell your doctor or health care professional that you are taking this medicine. What side effects may I notice from receiving this medicine? Side effects that you should report to your doctor or health care professional as soon as possible: -allergic reactions like skin rash, itching or hives, swelling of the face, lips, or tongue -breathing problems -dark urine -irregular heartbeat -swollen legs or ankles -vomiting -yellowing of the eyes or skin Side effects that usually do not require medical attention (report to your doctor or health care professional if they continue or are bothersome): -change in sex drive or performance -diarrhea -dry eyes (especially if wearing contact lenses) -dry, itching skin -headache -nausea -unusually tired This list may not describe all possible side effects. Call your doctor for medical advice about side effects. You may report side effects to FDA at 1-800-FDA-1088. Where should I keep my medicine? Keep out of the reach of children. Store at room temperature below 30 degrees C (86 degrees F). Protect from moisture. Keep container tightly closed. Throw away any unused medicine after the expiration date. NOTE: This sheet is a summary. It may not cover all possible information. If you have questions about this medicine, talk to your doctor, pharmacist, or health care provider.  2018 Elsevier/Gold Standard (2012-11-04 14:12:02)   Sacubitril; Valsartan oral tablet What is this medicine? SACUBITRIL; VALSARTAN (sak UE bi tril; val SAR tan) is a combination of 2  drugs used to reduce the risk of death and hospitalizations in people with long-lasting heart failure. It is usually used with other medicines to treat heart failure. This medicine may be used for other purposes; ask your health care provider or pharmacist if you have questions. COMMON BRAND NAME(S): Entresto What should I tell my health care provider before I take this medicine? They need to know if you have any of these conditions: -diabetes and take a medicine that contains aliskiren -kidney disease -liver disease -an unusual or allergic reaction to sacubitril; valsartan, drugs called angiotensin converting enzyme (ACE) inhibitors, angiotensin II receptor blockers (ARBs), other medicines, foods, dyes, or preservatives -pregnant or trying to get pregnant -breast-feeding How should I use this medicine? Take this medicine by mouth with a glass of water. Follow the directions on the prescription label. You can take it with or without food. If it upsets your stomach, take it with food. Take your medicine at regular intervals. Do not take it more often than directed. Do not stop taking except on your doctor's advice. Do not take this medicine for at least 36 hours before or after you take an ACE inhibitor medicine. Talk to your health care provider if you are not sure if you take an ACE inhibitor. Talk to your pediatrician regarding the use of this medicine in children. Special care may be needed. Overdosage: If you think you have taken too much of this medicine contact a poison control center or emergency room  at once. NOTE: This medicine is only for you. Do not share this medicine with others. What if I miss a dose? If you miss a dose, take it as soon as you can. If it is almost time for next dose, take only that dose. Do not take double or extra doses. What may interact with this medicine? Do not take this medicine with any of the following medicines: -aliskiren if you have  diabetes -angiotensin-converting enzyme (ACE) inhibitors, like benazepril, captopril, enalapril, fosinopril, lisinopril, or ramipril This medicine may also interact with the following medicines: -angiotensin II receptor blockers (ARBs) like azilsartan, candesartan, eprosartan, irbesartan, losartan, olmesartan, telmisartan, or valsartan -lithium -NSAIDS, medicines for pain and inflammation, like ibuprofen or naproxen -potassium-sparing diuretics like amiloride, spironolactone, and triamterene -potassium supplements This list may not describe all possible interactions. Give your health care provider a list of all the medicines, herbs, non-prescription drugs, or dietary supplements you use. Also tell them if you smoke, drink alcohol, or use illegal drugs. Some items may interact with your medicine. What should I watch for while using this medicine? Tell your doctor or healthcare professional if your symptoms do not start to get better or if they get worse. Do not become pregnant while taking this medicine. Women should inform their doctor if they wish to become pregnant or think they might be pregnant. There is a potential for serious side effects to an unborn child. Talk to your health care professional or pharmacist for more information. You may get dizzy. Do not drive, use machinery, or do anything that needs mental alertness until you know how this medicine affects you. Do not stand or sit up quickly, especially if you are an older patient. This reduces the risk of dizzy or fainting spells. Avoid alcoholic drinks; they can make you more dizzy. What side effects may I notice from receiving this medicine? Side effects that you should report to your doctor or health care professional as soon as possible: -allergic reactions like skin rash, itching or hives, swelling of the face, lips, or tongue -signs and symptoms of increased potassium like muscle weakness; chest pain; or fast, irregular  heartbeat -signs and symptoms of kidney injury like trouble passing urine or change in the amount of urine -signs and symptoms of low blood pressure like feeling dizzy or lightheaded, or if you develop extreme fatigue Side effects that usually do not require medical attention (report to your doctor or health care professional if they continue or are bothersome): -cough This list may not describe all possible side effects. Call your doctor for medical advice about side effects. You may report side effects to FDA at 1-800-FDA-1088. Where should I keep my medicine? Keep out of the reach of children. Store at room temperature between 15 and 30 degrees C (59 and 86 degrees F). Throw away any unused medicine after the expiration date. NOTE: This sheet is a summary. It may not cover all possible information. If you have questions about this medicine, talk to your doctor, pharmacist, or health care provider.  2018 Elsevier/Gold Standard (2015-04-15 13:54:19)

## 2017-05-31 NOTE — Progress Notes (Signed)
PCP: Wyatt Mage, MD Primary Cardiologist: Dr End Primary EP: Dr Rayann Heman  Jerry Gilbert is a 77 y.o. male who presents today for routine electrophysiology followup.  Since last being seen in our clinic, the patient reports doing very well.  SOB is improving.  Still has a cough.  Today, he denies symptoms of palpitations, chest pain, shortness of breath,  lower extremity edema, dizziness, presyncope, or syncope.  The patient is otherwise without complaint today.   Past Medical History:  Diagnosis Date  . AAA (abdominal aortic aneurysm) ([place]) 11/04/09   3.6 cmx 3.4 cm  . Anemia   . Atrial tachycardia ([place])   . CAD (coronary artery disease)    S/P PCI LAD  . Chaotic atrial rhythm   . Dysrhythmia   . H/O transfusion of packed red blood cells    16 units of blood in 2014 d/t GI bleed  . H/O: GI bleed   . Heart murmur    in 1961  . Hypertension   . Kidney stones   . Myocardial infarction ([place])   . Nocturia   . Personal history of unspecified circulatory disease    Transient Ischemic attacks  . Prostate cancer ([place])   . Shortness of breath dyspnea    'with minimal exertion'  . Stroke [facility])    "small TIA"  . Tobacco abuse    Past Surgical History:  Procedure Laterality Date  . ABDOMINAL AORTIC ANEURYSM REPAIR N/A 12/15/2014   Procedure: ABDOMINAL AORTIC ANEURYSM REPAIR;  Surgeon: Elam Dutch, MD;  Location: [place];  Service: Vascular;  Laterality: N/A;  . Bare-metal stent     Left circumflex about 10 years ago  . CARDIAC CATHETERIZATION     11/05/2008  . FEMORAL-POPLITEAL BYPASS GRAFT Left 05/20/2015   Procedure: LEFT COMMON FEMORAL ARTERY ANEURYSM REPAIR;  Surgeon: Elam Dutch, MD;  Location: [place];  Service: Vascular;  Laterality: Left;  . PERCUTANEOUS CORONARY STENT INTERVENTION (PCI-S)     LAD  . PROSTATE SURGERY    . TONSILLECTOMY      ROS- all systems are reviewed and negatives except as per HPI above  Current Outpatient Medications  Medication Sig  Dispense Refill  . amiodarone (PACERONE) 200 MG tablet Take 200 mg by mouth daily.    [name] aspirin EC 81 MG tablet Take 1 tablet (81 mg total) by mouth daily. 90 tablet 3  . cloNIDine (CATAPRES) 0.2 MG tablet Take 1 tablet (0.2 mg total) by mouth 2 (two) times daily. 180 tablet 3  . furosemide (LASIX) 20 MG tablet Take 1 tablet (20 mg total) by mouth daily. 90 tablet 3  . lisinopril (PRINIVIL,ZESTRIL) 20 MG tablet Take 1 tablet (20 mg total) by mouth 2 (two) times daily. 180 tablet 3  . Multiple Vitamins-Minerals (MULTIVITAMIN WITH MINERALS) tablet Take 1 tablet by mouth daily.    . nitroGLYCERIN (NITROSTAT) 0.4 MG SL tablet Place 1 tablet (0.4 mg total) under the tongue every 5 (five) minutes as needed for chest pain. 25 tablet 6  . pantoprazole (PROTONIX) 40 MG tablet Take 1 tablet (40 mg total) by mouth 2 (two) times daily. 180 tablet 3  . potassium chloride SA (K-DUR,KLOR-CON) 20 MEQ tablet Take 1 tablet (20 mEq total) by mouth daily. 90 tablet 3  . rosuvastatin (CRESTOR) 5 MG tablet Take 5 mg M-W-F by mouth 90 tablet 3   No current facility-administered medications for this visit.     Physical Exam: Vitals:   05/31/17 1124  BP: 126/78  Pulse: 81  SpO2: 99%  Weight: 175 lb (79.4 kg)  Height: 5\' 9"  (1.753 m)    GEN- The patient is well appearing, alert and oriented x 3 today.   Head- normocephalic, atraumatic Eyes-  Sclera clear, conjunctiva pink Ears- hearing intact Oropharynx- clear Lungs- Clear to ausculation bilaterally, normal work of breathing Heart- Regular rate and rhythm, no murmurs, rubs or gallops, PMI not laterally displaced GI- soft, NT, ND, + BS Extremities- no clubbing, cyanosis, or edema  EKG tracing ordered today is personally reviewed and shows sinus rhythm 60 bpm, incomplete LBBB, nonspecific St/T changes  Assessment and Plan:  1. Atrial fibrillation Maintaining sinus rhythm with amiodarone Not on anticoagulation due to prior GI bleeding  2. CAD S/p  recent CABG Doing well   3. HTN Stable No change required today  4. Tobacco He remains quite!   5. 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 Will stop metoprolol and start on coreg I discussed entresto today however he is concerned about costs of this medicine.  We will look into assistance of this for him.   QRS < 130 msec, does not meet criteria for CRT. Would defer ICD until medicines are optimized.  He would prefer to avoid this if possible  Follow-up with Dr End as scheduled in June I will see again in 6 months  Thompson Grayer MD, [facility] 05/31/2017 11:40 AM

## 2017-06-02 ENCOUNTER — Telehealth: Payer: Self-pay | Admitting: Internal Medicine

## 2017-06-02 NOTE — Telephone Encounter (Signed)
Patient and wife called concerned about stopping Lopressor and starting Carvedilol 6.25 mg bid per Dr Rayann Heman 3/20.[name]   Their concern is that the patient's HR runs between high 40s and low 50s and wanted Dr End to be aware of the medication change and their concern not to start it. Please advise, thank you

## 2017-06-02 NOTE — Telephone Encounter (Signed)
I am fine with switching to carvedilol. We will just have to keep an eye on his heart rate. They should let us know if it is consistent less than 50 and/or Mr. Holtzer has symptomatic bradycardia.  Nelva Bush, MD [facility] Pager: [phone]

## 2017-06-02 NOTE — Telephone Encounter (Signed)
Spoke with family and gave the recommendations... They verbalized understanding.Marland KitchenMarland Kitchen

## 2017-06-02 NOTE — Telephone Encounter (Signed)
New Message    Pt c/o medication issue:  1. Name of Medication:  carvedilol (COREG) 6.25 MG tablet Take 1 tablet (6.25 mg total) by mouth 2 (two) times daily        2. How are you currently taking this medication (dosage and times per day)?  No starting it yet til he talks to someone  3. Are you having a reaction (difficulty breathing--STAT)?  no  4. What is your medication issue? Patient hr has been 48 for the last couple days    STAT if HR is under 50 or over 120 (normal HR is 60-100 beats per minute)  1) What is your heart rate?  48 the last couple day   2) Do you have a log of your heart rate readings (document readings)? no  3) Do you have any other symptoms?  No

## 2017-08-14 ENCOUNTER — Other Ambulatory Visit: Payer: Medicare Other | Admitting: *Deleted

## 2017-08-14 ENCOUNTER — Encounter: Payer: Self-pay | Admitting: Internal Medicine

## 2017-08-14 ENCOUNTER — Ambulatory Visit (INDEPENDENT_AMBULATORY_CARE_PROVIDER_SITE_OTHER): Payer: Medicare Other | Admitting: Internal Medicine

## 2017-08-14 VITALS — BP 112/76 | HR 69 | Ht 69.0 in | Wt 179.0 lb

## 2017-08-14 DIAGNOSIS — R0602 Shortness of breath: Secondary | ICD-10-CM

## 2017-08-14 DIAGNOSIS — I255 Ischemic cardiomyopathy: Secondary | ICD-10-CM

## 2017-08-14 DIAGNOSIS — I480 Paroxysmal atrial fibrillation: Secondary | ICD-10-CM | POA: Diagnosis not present

## 2017-08-14 DIAGNOSIS — I10 Essential (primary) hypertension: Secondary | ICD-10-CM

## 2017-08-14 DIAGNOSIS — E785 Hyperlipidemia, unspecified: Secondary | ICD-10-CM | POA: Diagnosis not present

## 2017-08-14 DIAGNOSIS — I5022 Chronic systolic (congestive) heart failure: Secondary | ICD-10-CM

## 2017-08-14 DIAGNOSIS — I2510 Atherosclerotic heart disease of native coronary artery without angina pectoris: Secondary | ICD-10-CM | POA: Diagnosis not present

## 2017-08-14 LAB — LIPID PANEL
CHOL/HDL RATIO: 4.8 ratio (ref 0.0–5.0)
Cholesterol, Total: 221 mg/dL — ABNORMAL HIGH (ref 100–199)
HDL: 46 mg/dL (ref >39)
LDL Calculated: 143 mg/dL — ABNORMAL HIGH (ref 0–99)
Triglycerides: 158 mg/dL — ABNORMAL HIGH (ref 0–149)
VLDL CHOLESTEROL CAL: 32 mg/dL (ref 5–40)

## 2017-08-14 LAB — CK: Total CK: 45 U/L (ref 24–204)

## 2017-08-14 LAB — BASIC METABOLIC PANEL
BUN / CREAT RATIO: 10 (ref 10–24)
BUN: 12 mg/dL (ref 8–27)
CO2: 26 mmol/L (ref 20–29)
CREATININE: 1.21 mg/dL (ref 0.76–1.27)
Calcium: 9.5 mg/dL (ref 8.6–10.2)
Chloride: 96 mmol/L (ref 96–106)
GFR calc Af Amer: 67 mL/min/{1.73_m2} (ref >59)
GFR, EST NON AFRICAN AMERICAN: 58 mL/min/{1.73_m2} — AB (ref >59)
Glucose: 115 mg/dL — ABNORMAL HIGH (ref 65–99)
Potassium: 4.5 mmol/L (ref 3.5–5.2)
SODIUM: 137 mmol/L (ref 134–144)

## 2017-08-14 LAB — ALT: ALT: 29 IU/L (ref 0–44)

## 2017-08-14 NOTE — Progress Notes (Signed)
Follow-up Outpatient Visit Date: 08/14/2017  Primary Care Provider: Wyatt Mage, MD [facility] [address] [place] [address]  Chief Complaint: Follow-up coronary artery disease and systolic heart failure  HPI:  Mr. Coon is a 77 y.o. year-old male with history of coronary artery disease status post CABG (02/2017), ischemic cardiomyopathy (LVEF 40% in 08/2015), AAA status post repair, paroxysmal atrial fibrillation, hypertension, hyperlipidemia, and COPD, who presents for follow-up of coronary artery disease and heart failure.  I last saw Mr. Lembcke in March, at which time his breathing/cough were slowly improving following CABG in 12/18.  Echo at that time showed a LVEF of 30%.  He was subsequently seen by Dr. Rayann Heman on 05/31/17, at which time metoprolol was switched to carvedilol.  Today, Mr. Curt reports feeling quite well.  He still has some exertional dyspnea, but better than at prior visits.  He is now able to lie flat with only minimal cough.  He denies orthopnea and PND as well as angina.  He still has some chest soreness not at the sternotomy site but more laterally along the chest wall.  He does not have any instability when he palpates on his chest wall.  He is tolerating his medications well.  He reports that his home blood pressure is still little bit high but overall improved with systolic readings usually around 140 mmHg.  His resting heart rate is typically 50 bpm.  He was switched from metoprolol to carvedilol at his last visit by Dr. Rayann Heman, which he seems to be tolerating well.  He is not participating in cardiac rehab.  He denies palpitations, lightheadedness, and dizziness.  --------------------------------------------------------------------------------------------------  Cardiovascular History & Procedures: Cardiovascular Problems:  Coronary artery disease status post CABG and PCI  Paroxysmal atrial fibrillation  AAA status post repair  Risk Factors:  Known coronary  artery disease, hypertension, hyperlipidemia, tobacco use, male gender, and age greater than 44  Cath/PCI:  None available  CV Surgery:  CABG (03/10/17, [place], [place], [place]): LIMA to LAD, SVG to diagonal, SVG to OM, and SVG to PDA  AAA repair  EP Procedures and Devices:  None  Non-Invasive Evaluation(s):  TTE (05/18/17): Normal LV size with LVEF 25-30% and diffuse hypokinesis.  Grade 2 diastolic dysfunction.  Mildly thickened/calcified aortic valve without stenosis or regurgitation.  Mildly thickened mitral valve with mild MR.  Mildly dilated RV with moderately reduced contraction.  TTE (09/09/15, [facility]): LVEF 40%. Moderate mitral regurgitation. Mild tricuspid regurgitation.   Recent CV Pertinent Labs: Lab Results  Component Value Date   CHOL 262 (H) 05/18/2017   HDL 41 05/18/2017   LDLCALC 183 (H) 05/18/2017   LDLDIRECT 174.2 12/19/2011   TRIG 191 (H) 05/18/2017   CHOLHDL 6.4 (H) 05/18/2017   CHOLHDL 6 09/01/2014   INR 0.96 05/19/2015   K 4.5 05/18/2017   MG 2.0 12/16/2014   BUN 16 05/18/2017   CREATININE 1.22 05/18/2017   CREATININE 0.97 09/02/2015    Past medical and surgical history were reviewed and updated in EPIC.  Current Meds  Medication Sig  . amiodarone (PACERONE) 200 MG tablet Take 200 mg by mouth daily.  [name] aspirin EC 81 MG tablet Take 1 tablet (81 mg total) by mouth daily.  . carvedilol (COREG) 6.25 MG tablet Take 1 tablet (6.25 mg total) by mouth 2 (two) times daily.  . cloNIDine (CATAPRES) 0.2 MG tablet Take 1 tablet (0.2 mg total) by mouth 2 (two) times daily.  . furosemide (LASIX) 20 MG  tablet Take 1 tablet (20 mg total) by mouth daily.  . Multiple Vitamins-Minerals (MULTIVITAMIN WITH MINERALS) tablet Take 1 tablet by mouth daily.  . nitroGLYCERIN (NITROSTAT) 0.4 MG SL tablet Place 1 tablet (0.4 mg total) under the tongue every 5 (five) minutes as needed for chest pain.  . pantoprazole (PROTONIX) 40 MG tablet Take 1  tablet (40 mg total) by mouth 2 (two) times daily.  . potassium chloride SA (K-DUR,KLOR-CON) 20 MEQ tablet Take 1 tablet (20 mEq total) by mouth daily.  . rosuvastatin (CRESTOR) 5 MG tablet Take 5 mg M-W-F by mouth    Allergies: Celecoxib; Diclofenac; Statins; Warfarin and related; and Morphine and related  Social History   Tobacco Use  . Smoking status: Former Smoker    Packs/day: 1.00    Years: 80.00    Pack years: 80.00    Types: Cigarettes    Last attempt to quit: 03/06/2017    Years since quitting: 0.4  . Smokeless tobacco: Never Used  Substance Use Topics  . Alcohol use: No    Alcohol/week: 0.0 oz    Comment: " No alcohol in over a year."  . Drug use: No    Comment: No illicit drug use and only takes multivitamin as far as herbal medication goes.    Family History  Problem Relation Age of Onset  . Heart attack Mother 58  . Heart disease Mother        < 27  . Coronary artery disease Brother   . Diabetes Brother   . Heart disease Brother        CABG  . Hyperlipidemia Brother   . Hypertension Brother   . Heart disease Father   . Arrhythmia Brother        S/P cardioversion    Review of Systems: A 12-system review of systems was performed and was negative except as noted in the HPI.  --------------------------------------------------------------------------------------------------  Physical Exam: BP 112/76   Pulse 69   Ht 5\' 9"  (1.753 m)   Wt 179 lb (81.2 kg)   SpO2 96%   BMI 26.43 kg/m   General: NAD. HEENT: No conjunctival pallor or scleral icterus. Moist mucous membranes.  OP clear. Neck: Supple without lymphadenopathy, thyromegaly, JVD, or HJR. No carotid bruit. Lungs: Normal work of breathing. Clear to auscultation bilaterally without wheezes or crackles. Heart: Regular rate and rhythm without murmurs, rubs, or gallops. Non-displaced PMI.  No mobility of sternum.  No tenderness over median sternotomy. Abd: Bowel sounds present. Soft, NT/ND without  hepatosplenomegaly Ext: No lower extremity edema. Radial, PT, and DP pulses are 2+ bilaterally. Skin: Warm and dry without rash.  EKG: Normal sinus rhythm with left anterior fascicular block and nonspecific ST/T changes.  Lab Results  Component Value Date   WBC 9.3 04/14/2017   HGB 11.0 (L) 04/14/2017   HCT 35.4 (L) 04/14/2017   MCV 83 04/14/2017   PLT 303 04/14/2017    Lab Results  Component Value Date   NA 137 05/18/2017   K 4.5 05/18/2017   CL 96 05/18/2017   CO2 26 05/18/2017   BUN 16 05/18/2017   CREATININE 1.22 05/18/2017   GLUCOSE 100 (H) 05/18/2017   ALT 22 04/14/2017    Lab Results  Component Value Date   CHOL 262 (H) 05/18/2017   HDL 41 05/18/2017   LDLCALC 183 (H) 05/18/2017   LDLDIRECT 174.2 12/19/2011   TRIG 191 (H) 05/18/2017   CHOLHDL 6.4 (H) 05/18/2017    --------------------------------------------------------------------------------------------------  ASSESSMENT AND  PLAN: Coronary artery disease without angina No symptoms to suggest worsening coronary insufficiency.  Chest pain to palpation along the chest wall may reflect some residual soreness from his CABG, though he is coming up on 6 months.  I do not feel any instability along the sternotomy at this time.  If it does not improve by her next visit or worsen significantly, we may need to obtain a CT of the chest to evaluate for malunion/nonunion of the sternum and consider referral to cardiac surgery.  Continue current medications for secondary prevention.  Chronic systolic heart failure secondary to ischemic cardiomyopathy Mr. Mcnatt appears euvolemic and well compensated with NYHA class II symptoms.  We will continue current doses of carvedilol and lisinopril.  We discussed switching to [facility], but he is reluctant to do so due to cost.  We will plan to repeat an echocardiogram when he returns for follow-up in 3 months.  Basic metabolic panel is pending today.  If renal function and potassium allow,  we will consider adding spironolactone.  Paroxysmal atrial fibrillation EKG today demonstrates sinus rhythm.  No symptoms to suggest recurrence.  Continue amiodarone and follow-up with Dr. Rayann Heman.  Hypertension Blood pressure well controlled today, though home readings still seem to be a little bit more elevated.  No medication changes at this time, though we will consider adding Spironolactone, as outlined above.  Hyperlipidemia Mr. [place] is tolerating rosuvastatin 5 mg on Mondays, Wednesdays, and Fridays well.  Lipid panel is pending at this time.  Based on results, we will consider up titration of the medication.  Follow-up: Return to clinic in 3 months with echocardiogram at that time.  Nelva Bush, MD 08/14/2017 2:06 PM

## 2017-08-14 NOTE — Patient Instructions (Addendum)
Medication Instructions:  Your physician recommends that you continue on your current medications as directed. Please refer to the Current Medication list given to you today.  -- If you need a refill on your cardiac medications before your next appointment, please call your pharmacy. --  Labwork: None ordered  Testing/Procedures: Schedule to be done same day as office visit   Your physician has requested that you have an echocardiogram. Echocardiography is a painless test that uses sound waves to create images of your heart. It provides your doctor with information about the size and shape of your heart and how well your heart's chambers and valves are working. This procedure takes approximately one hour. There are no restrictions for this procedure.   Follow-Up:  Same Day As ECHO..   Your physician wants you to follow-up in: 3 months with Dr. Saunders Revel.    Thank you for choosing [facility]!!    Any Other Special Instructions Will Be Listed Below (If Applicable).

## 2017-08-15 ENCOUNTER — Encounter: Payer: Self-pay | Admitting: Internal Medicine

## 2017-08-16 ENCOUNTER — Telehealth: Payer: Self-pay | Admitting: Internal Medicine

## 2017-08-16 ENCOUNTER — Other Ambulatory Visit: Payer: Self-pay

## 2017-08-16 DIAGNOSIS — I10 Essential (primary) hypertension: Secondary | ICD-10-CM

## 2017-08-16 MED ORDER — ROSUVASTATIN CALCIUM 5 MG PO TABS: 5.0000 mg | ORAL_TABLET | Freq: Every day | ORAL | 3 refills | Status: DC

## 2017-08-16 MED ORDER — SPIRONOLACTONE 25 MG PO TABS: 25.0000 mg | ORAL_TABLET | Freq: Every day | ORAL | 3 refills | Status: DC

## 2017-08-16 NOTE — Telephone Encounter (Signed)
Pt's wife calling    Returning call to nurse because she has question about medication. Please call pt's wife at [phone]

## 2017-08-16 NOTE — Telephone Encounter (Signed)
New message      Pt c/o medication issue:  1. Name of Medication: lisinopril, spironolactone and potassium 2. How are you currently taking this medication (dosage and times per day)?    3. Are you having a reaction (difficulty breathing--STAT)? no 4. What is your medication issue?  Pt is at pharmacy to pick up meds and pharmacist need to talk to someone about a possible drug interaction causing irr heartbeat.

## 2017-08-16 NOTE — Telephone Encounter (Signed)
Spoke with pharmacist who indicated that Lisinopril, Potassium and Spironolactone combined could cause dysrhythmia for the patient.  I informed her that we had discontinued the Potassium. She verbalized that would solve the problem.

## 2017-08-17 ENCOUNTER — Other Ambulatory Visit: Payer: Self-pay

## 2017-08-17 DIAGNOSIS — E785 Hyperlipidemia, unspecified: Secondary | ICD-10-CM

## 2017-08-21 ENCOUNTER — Other Ambulatory Visit: Payer: Medicare Other | Admitting: *Deleted

## 2017-08-21 DIAGNOSIS — I10 Essential (primary) hypertension: Secondary | ICD-10-CM

## 2017-08-21 LAB — BASIC METABOLIC PANEL
BUN/Creatinine Ratio: 13 (ref 10–24)
BUN: 16 mg/dL (ref 8–27)
CALCIUM: 9.3 mg/dL (ref 8.6–10.2)
CHLORIDE: 96 mmol/L (ref 96–106)
CO2: 25 mmol/L (ref 20–29)
Creatinine, Ser: 1.24 mg/dL (ref 0.76–1.27)
GFR calc Af Amer: 65 mL/min/{1.73_m2} (ref >59)
GFR calc non Af Amer: 57 mL/min/{1.73_m2} — ABNORMAL LOW (ref >59)
GLUCOSE: 113 mg/dL — AB (ref 65–99)
Potassium: 4.2 mmol/L (ref 3.5–5.2)
Sodium: 138 mmol/L (ref 134–144)

## 2017-08-22 ENCOUNTER — Other Ambulatory Visit: Payer: Self-pay | Admitting: Nurse Practitioner

## 2017-08-22 DIAGNOSIS — I5022 Chronic systolic (congestive) heart failure: Secondary | ICD-10-CM

## 2017-08-22 DIAGNOSIS — I10 Essential (primary) hypertension: Secondary | ICD-10-CM

## 2017-08-27 IMAGING — CT CT CTA ABD/PEL W/CM AND/OR W/O CM
2 of 8 series · 14 of 46 positions shown, 16 images · IV contrast (omnipaque)
Comparison: None.

CLINICAL DATA: 74-year-old with current history of abdominal aortic
aneurysm, measured at approximately 5 cm in the office earlier
today. Preoperative evaluation. Current history of coronary artery
disease post left circumflex coronary stenting, hypertension and
atrial tachycardia. Personal history of prostate cancer.

EXAM:
CTA ABDOMEN AND PELVIS WITHOUT AND WITH CONTRAST
TECHNIQUE: Multidetector CT imaging of the abdomen and pelvis was performed
using the standard protocol during bolus administration of
intravenous contrast. Multiplanar reconstructed images and MIPs were
obtained and reviewed to evaluate the vascular anatomy.
CONTRAST:  100mL OMNIPAQUE IOHEXOL 350 MG/ML IV.

[Series 4: dissection 2.0 i30f 1 · axial · 0.90mm/px · z∈[-733,-295]mm · 11 of 243 slices shown, 13 images]
[im 12/243  soft-tissue]
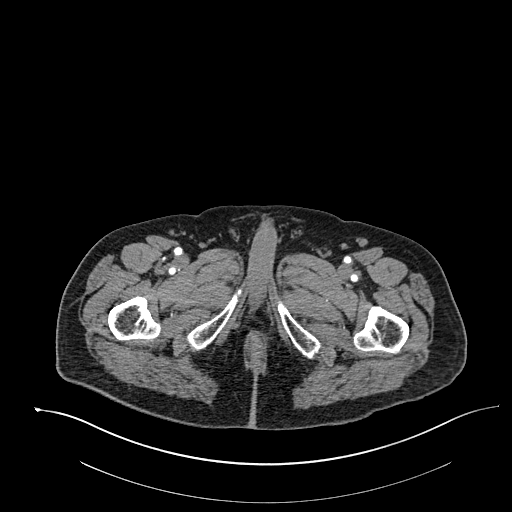
[im 12/243  bone]
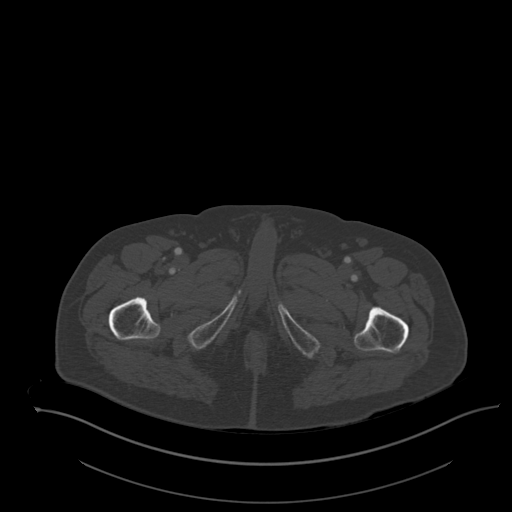
[im 35/243  soft-tissue]
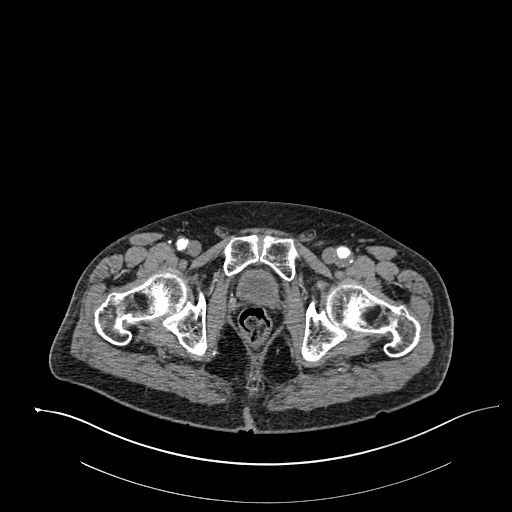
[im 58/243  soft-tissue]
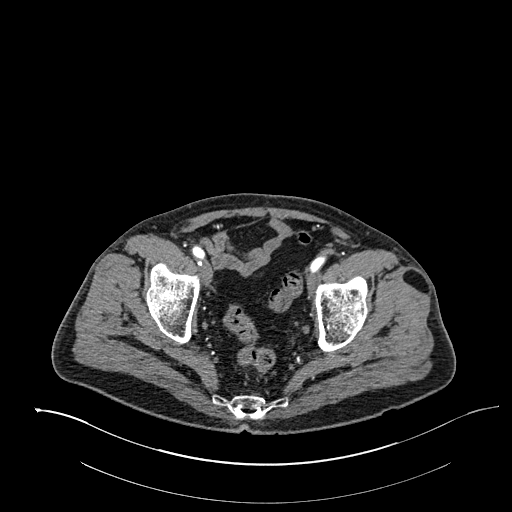
[im 81/243  soft-tissue]
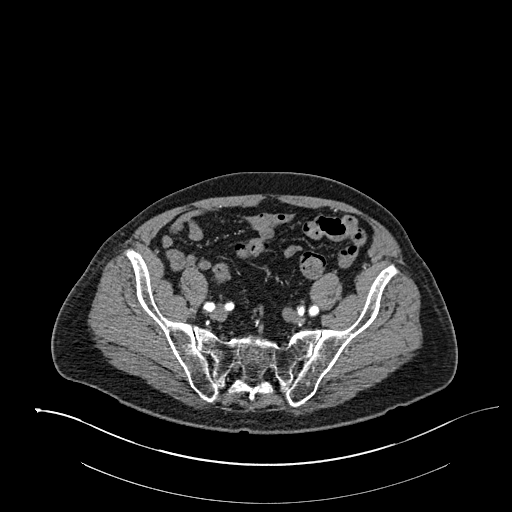
[im 104/243  soft-tissue]
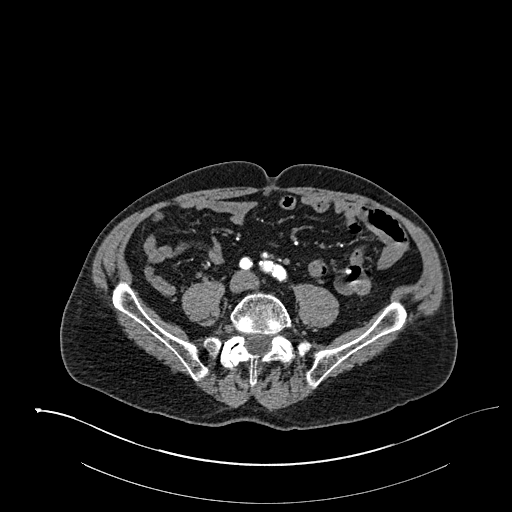
[im 127/243  soft-tissue]
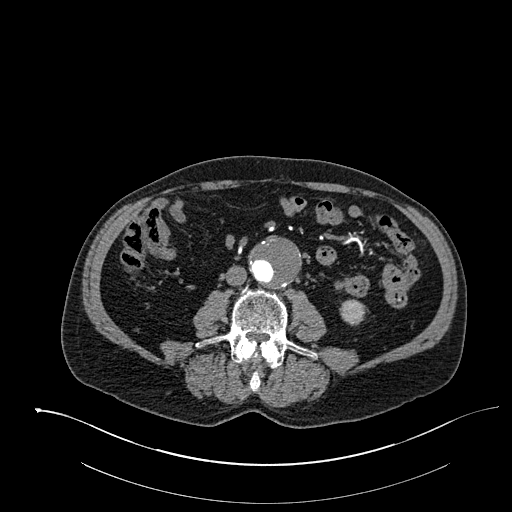
[im 139/243  soft-tissue]
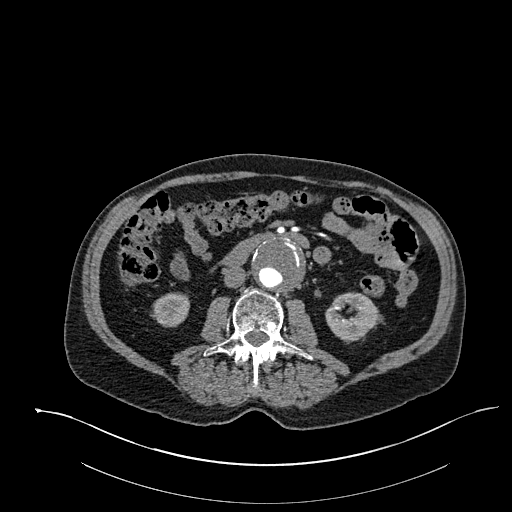
[im 162/243  soft-tissue]
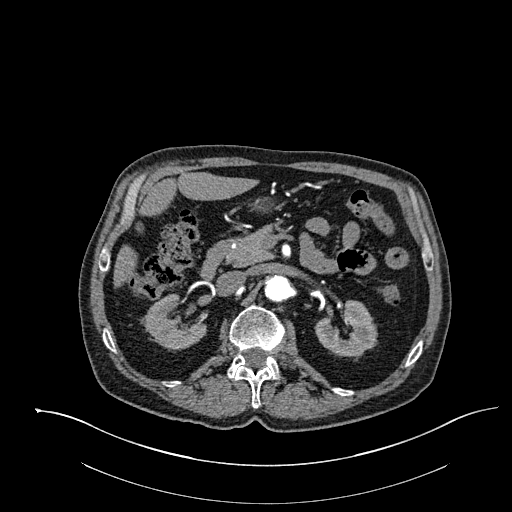
[im 185/243  soft-tissue]
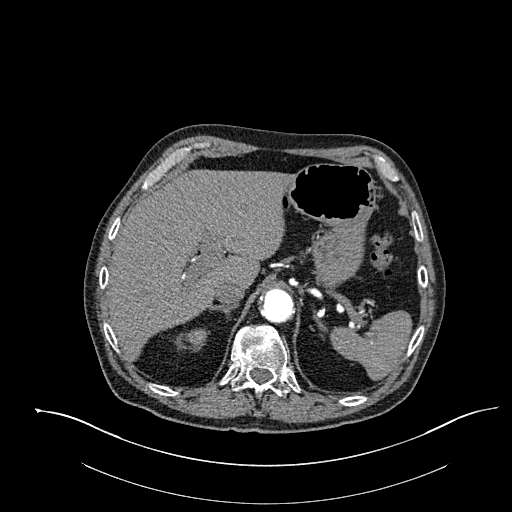
[im 185/243  bone]
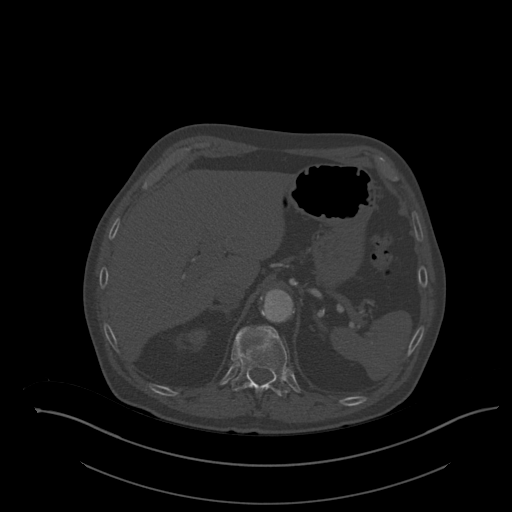
[im 208/243  soft-tissue]
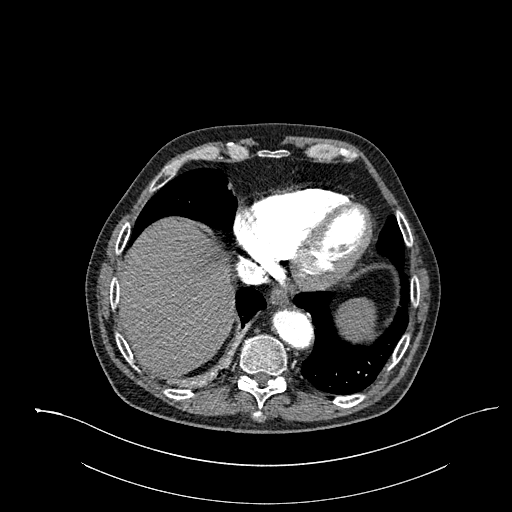
[im 231/243  soft-tissue]
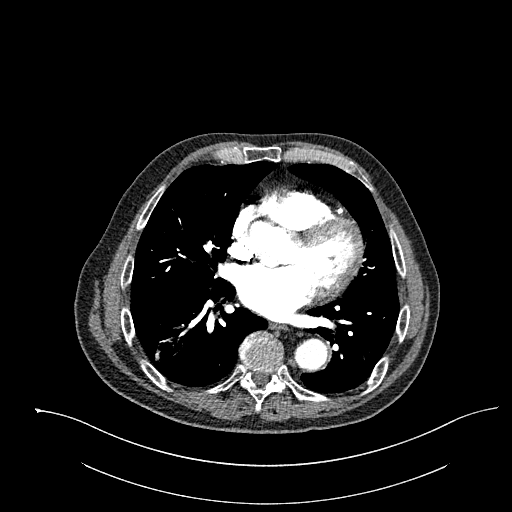

[Series 6: coronal mpr · coronal · 0.71mm/px · 3 of 149 slices shown]
[im 38/149  soft-tissue]
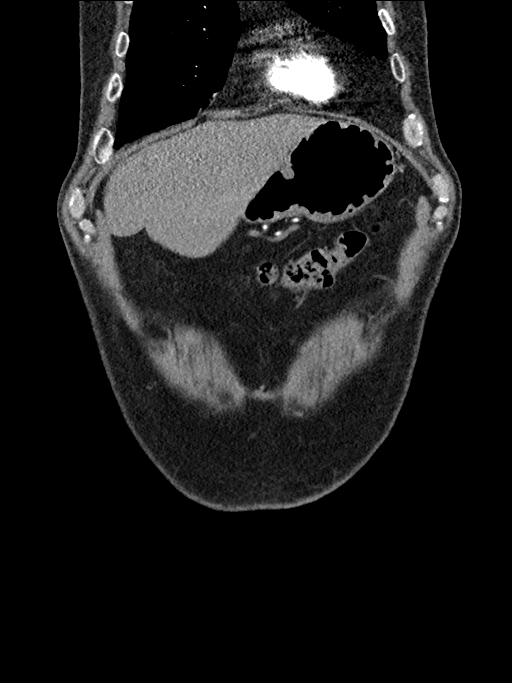
[im 75/149  soft-tissue]
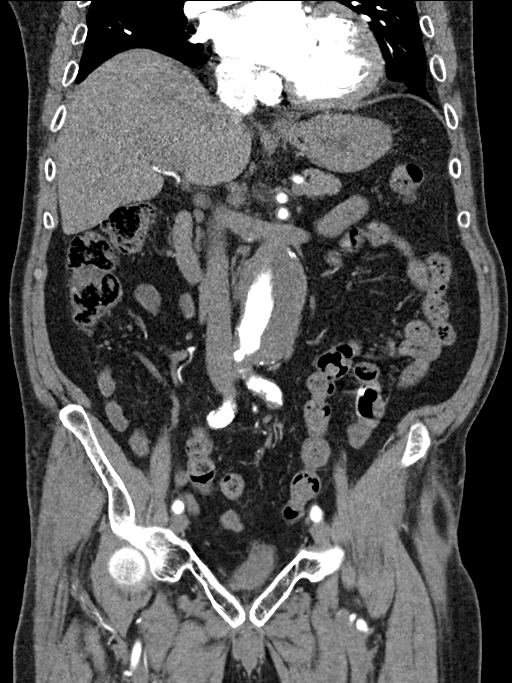
[im 112/149  soft-tissue]
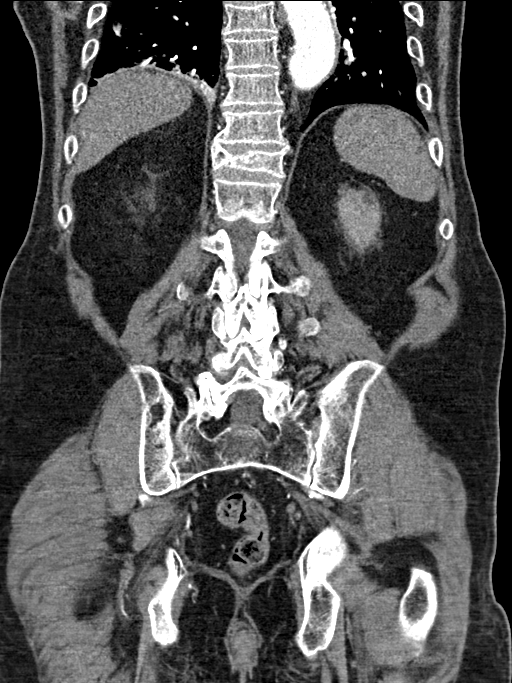

[14 of 46 positions shown; findings below may reference images not displayed]

FINDINGS: Infrarenal abdominal aortic aneurysm with maximum measurements of
5.0 cm (AP) x 5.1 cm (transverse), extending over an approximate 9
cm length. Extensive mural thrombus is present within the aneurysm,
the mural thrombus beginning just below the origins of the renal
arteries along the left lateral wall, with ultimate involvement of
the anterior and right lateral walls. The inferior mesenteric artery
remains patent, as flow is present within the mural thrombus
anteriorly to the origin of the IMA. Celiac artery and SMA are
widely patent. Single renal arteries supply each kidney, with
proximal atherosclerosis, though no visible hemodynamically
significant stenosis.

Severe atherosclerosis involving the bilateral iliofemoral arteries.
Aneurysm involving the proximal left common iliac artery up to
approximately 1.7 cm diameter. Aneurysm involving the left common
femoral artery up to approximately 2.2 cm diameter, with mural
thrombus.

Allowing for the early arterial phase of enhancement,
normal-appearing liver, pancreas, and adrenal glands. Calcified
granuloma involving the lower pole of the otherwise normal-appearing
spleen. Small cortical cyst arising from the upper pole of the
otherwise normal-appearing left kidney. Normal-appearing right
kidney. No pathologic lymphadenopathy involving the abdomen or
pelvis.

Stomach normal in appearance; opaque ingested material is present in
the distal body of the stomach. Normal-appearing small bowel. Normal
appearing colon with expected stool burden. Cecum positioned in the
right upper quadrant of the abdomen. Normal decompressed appendix in
the right upper pelvis. No ascites.

Urinary bladder decompressed and unremarkable. Prior prostatectomy.
No evidence of tumor recurrence in the prostate bed.

Bone window images demonstrate osseous demineralization, severe
compression fracture of the T12 vertebral body on the order of 80%
or so which does not appear acute, and facet degenerative changes
involving the lower lumbar spine.

Heart moderately enlarged. Scar/atelectasis in the deep posterior
right lower lobe and in the mid right lower lobe. Visualized lung
bases otherwise clear.

Review of the MIP images confirms the above findings.
IMPRESSION: 1. Approximate 5.1 cm infrarenal abdominal aortic aneurysm extending
to the aortic bifurcation with extensive mural thrombus along the
lateral and anterior walls.
2. Patent visceral arteries. The IMA is patent, as flow extends from
the aortic lumen anteriorly within the mural thrombus to the origin
of the IMA.
3. Severe atherosclerosis involving the iliofemoral arteries with a
left common iliac artery aneurysm (1.7 cm diameter) and a left
common femoral artery aneurysm (2.1 cm diameter).
4. No acute abnormalities involving the abdomen or pelvis.

## 2017-09-29 ENCOUNTER — Telehealth: Payer: Self-pay

## 2017-09-29 NOTE — Telephone Encounter (Signed)
Notes recorded by Frederik Schmidt, RN on 09/29/2017 at 9:40 AM EDT Spoke with the patient and gave him results/recommendations. He verbalized understanding. ------

## 2017-09-29 NOTE — Telephone Encounter (Signed)
-----   Message from Nelva Bush, MD sent at 09/29/2017  9:29 AM EDT ----- Labs checked 08/31/17 showed stable renal function and potassium (report received yesterday).  Mr. Kentner should continue his current medications and follow-up as previously discussed.

## 2017-11-18 ENCOUNTER — Telehealth: Payer: Self-pay | Admitting: Cardiology

## 2017-11-18 NOTE — Telephone Encounter (Signed)
Pt's BP was down to 80/40 last night, he had been working outside so today wife held lasix and aldactone.  BP is 160/80.  No edema and no SOB.  Will resume aldactone if BP stays up and hold lasix --she will call if any further issues to see Dr. Saunders Revel the 27th.  Will notify Dr. Saunders Revel

## 2017-11-20 ENCOUNTER — Telehealth: Payer: Self-pay | Admitting: Internal Medicine

## 2017-11-20 NOTE — Telephone Encounter (Signed)
LPMTCB 9/9

## 2017-11-20 NOTE — Telephone Encounter (Signed)
If BP is back to normal and patient feels well, I agree with restarting spironolactone and using furosemide on an as needed basis for weight gain/edema.  We will f/u as planned later this month unless new symptoms develop of blood pressure remains low.  Nelva Bush, MD [facility] Pager: [phone]

## 2017-11-20 NOTE — Telephone Encounter (Signed)
Follow Up:      Patient returning a call

## 2017-11-20 NOTE — Telephone Encounter (Signed)
Spoke to patient's wife who said that the patient is feeling better and the BP is stabilizing.  She reiterated that he had been out working in the sun over the weekend, which may have contributed to this.  I shared Dr Darnelle Bos recommendation and they will monitor BP and keep [place] updated throughout the week.

## 2017-12-08 ENCOUNTER — Ambulatory Visit (HOSPITAL_COMMUNITY): Payer: Medicare Other | Attending: Internal Medicine

## 2017-12-08 ENCOUNTER — Other Ambulatory Visit: Payer: Self-pay

## 2017-12-08 ENCOUNTER — Ambulatory Visit (INDEPENDENT_AMBULATORY_CARE_PROVIDER_SITE_OTHER): Payer: Medicare Other | Admitting: Internal Medicine

## 2017-12-08 ENCOUNTER — Encounter: Payer: Self-pay | Admitting: Internal Medicine

## 2017-12-08 VITALS — BP 142/68 | HR 58 | Ht 70.0 in | Wt 172.0 lb

## 2017-12-08 DIAGNOSIS — E785 Hyperlipidemia, unspecified: Secondary | ICD-10-CM

## 2017-12-08 DIAGNOSIS — I2510 Atherosclerotic heart disease of native coronary artery without angina pectoris: Secondary | ICD-10-CM

## 2017-12-08 DIAGNOSIS — I5022 Chronic systolic (congestive) heart failure: Secondary | ICD-10-CM

## 2017-12-08 DIAGNOSIS — I480 Paroxysmal atrial fibrillation: Secondary | ICD-10-CM

## 2017-12-08 DIAGNOSIS — I080 Rheumatic disorders of both mitral and aortic valves: Secondary | ICD-10-CM | POA: Diagnosis not present

## 2017-12-08 DIAGNOSIS — I255 Ischemic cardiomyopathy: Secondary | ICD-10-CM

## 2017-12-08 DIAGNOSIS — J449 Chronic obstructive pulmonary disease, unspecified: Secondary | ICD-10-CM | POA: Insufficient documentation

## 2017-12-08 DIAGNOSIS — I10 Essential (primary) hypertension: Secondary | ICD-10-CM

## 2017-12-08 DIAGNOSIS — Z951 Presence of aortocoronary bypass graft: Secondary | ICD-10-CM | POA: Diagnosis not present

## 2017-12-08 DIAGNOSIS — I252 Old myocardial infarction: Secondary | ICD-10-CM | POA: Diagnosis not present

## 2017-12-08 NOTE — Progress Notes (Signed)
Follow-up Outpatient Visit Date: 12/08/2017  Primary Care Provider: Wyatt Mage, MD [facility] [address] [place] [address]  Chief Complaint: Left heart failure and coronary artery disease  HPI:  Jerry Gilbert is a 77 y.o. year-old male with history of coronary artery disease status post CABG (02/2017), ischemic cardiomyopathy (LVEF 40% in 08/2015), AAA status post repair, paroxysmal atrial fibrillation, hypertension, hyperlipidemia, and COPD, who presents for follow-up of CAD and heart failure.  I last saw Mr. Quest in early June, which time he was doing well.  He continued to have some exertional dyspnea but noted that it was better than at prior visits.  He contacted our office earlier this month due to an episode of low blood pressure after having worked outside and becoming dehydrated.  Furosemide and spironolactone were held for a day, after which spironolactone was restarted and furosemide used on an as-needed basis.  Today, Mr. Crymes reports that he has been doing quite well.  Other than the aforementioned episode of hypotension, his blood pressure has been normal to mildly elevated.  He denies chest pain, shortness of breath, palpitations, lightheadedness, and edema.  He is taking his medications as prescribed without side effects, including rosuvastatin.  --------------------------------------------------------------------------------------------------  Cardiovascular History & Procedures: Cardiovascular Problems:  Coronary artery disease status post CABG and PCI  Paroxysmal atrial fibrillation  AAA status post repair  Risk Factors:  Known coronary artery disease, hypertension, hyperlipidemia, tobacco use, male gender, and age greater than 43  Cath/PCI:  None available  CV Surgery:  CABG (03/10/17, [place], [place], [place] LIMA to LAD, SVG to diagonal, SVG to OM, and SVG to PDA  AAA repair  EP Procedures and Devices:  None  Non-Invasive  Evaluation(s):  TTE (12/08/17): Normal LV size with moderate LVH.  LVEF 40-45% with inferior hypokinesis.  Calcified aortic valve with mild regurgitation.  Mild mitral regurgitation.  Mildly dilated RV with mildly reduced contraction.  Normal pulmonary artery and central venous pressure.  TTE (05/18/17): Normal LV size with LVEF 25-30% and diffuse hypokinesis.  Grade 2 diastolic dysfunction.  Mildly thickened/calcified aortic valve without stenosis or regurgitation.  Mildly thickened mitral valve with mild MR.  Mildly dilated RV with moderately reduced contraction.  TTE (09/09/15, [facility]): LVEF 40%. Moderate mitral regurgitation. Mild tricuspid regurgitation.  Recent CV Pertinent Labs: Lab Results  Component Value Date   CHOL 221 (H) 08/14/2017   HDL 46 08/14/2017   LDLCALC 143 (H) 08/14/2017   LDLDIRECT 174.2 12/19/2011   TRIG 158 (H) 08/14/2017   CHOLHDL 4.8 08/14/2017   CHOLHDL 6 09/01/2014   INR 0.96 05/19/2015   K 4.2 08/21/2017   MG 2.0 12/16/2014   BUN 16 08/21/2017   CREATININE 1.24 08/21/2017   CREATININE 0.97 09/02/2015    Past medical and surgical history were reviewed and updated in EPIC.  Current Meds  Medication Sig  . amiodarone (PACERONE) 200 MG tablet Take 200 mg by mouth daily.  [name] aspirin EC 81 MG tablet Take 1 tablet (81 mg total) by mouth daily.  . carvedilol (COREG) 6.25 MG tablet Take 1 tablet (6.25 mg total) by mouth 2 (two) times daily.  . cloNIDine (CATAPRES) 0.2 MG tablet Take 1 tablet (0.2 mg total) by mouth 2 (two) times daily.  . furosemide (LASIX) 20 MG tablet Take 1 tablet (20 mg total) by mouth daily.  . Multiple Vitamins-Minerals (MULTIVITAMIN WITH MINERALS) tablet Take 1 tablet by mouth daily.  . nitroGLYCERIN (NITROSTAT) 0.4 MG SL tablet  Place 1 tablet (0.4 mg total) under the tongue every 5 (five) minutes as needed for chest pain.  . pantoprazole (PROTONIX) 40 MG tablet Take 1 tablet (40 mg total) by mouth 2 (two) times daily.  .  rosuvastatin (CRESTOR) 5 MG tablet Take 1 tablet (5 mg total) by mouth daily.  [name] spironolactone (ALDACTONE) 25 MG tablet Take 1 tablet (25 mg total) by mouth daily.    Allergies: Celecoxib; Diclofenac; Statins; Warfarin and related; and Morphine and related  Social History   Tobacco Use  . Smoking status: Former Smoker    Packs/day: 1.00    Years: 80.00    Pack years: 80.00    Types: Cigarettes    Last attempt to quit: 03/06/2017    Years since quitting: 0.7  . Smokeless tobacco: Never Used  Substance Use Topics  . Alcohol use: No    Alcohol/week: 0.0 standard drinks    Comment: " No alcohol in over a year."  . Drug use: No    Comment: No illicit drug use and only takes multivitamin as far as herbal medication goes.    Family History  Problem Relation Age of Onset  . Heart attack Mother 64  . Heart disease Mother        < 37  . Coronary artery disease Brother   . Diabetes Brother   . Heart disease Brother        CABG  . Hyperlipidemia Brother   . Hypertension Brother   . Heart disease Father   . Arrhythmia Brother        S/P cardioversion    Review of Systems: A 12-system review of systems was performed and was negative except as noted in the HPI.  --------------------------------------------------------------------------------------------------  Physical Exam: BP (!) 142/68   Pulse (!) 58   Ht 5\' 10"  (1.778 m)   Wt 172 lb (78 kg)   SpO2 92%   BMI 24.68 kg/m   General: NAD. HEENT: No conjunctival pallor or scleral icterus. Moist mucous membranes.  OP clear. Neck: Supple without lymphadenopathy, thyromegaly, JVD, or HJR. Lungs: Normal work of breathing. Clear to auscultation bilaterally without wheezes or crackles. Heart: Bradycardic but regular without murmurs, rubs, or gallops. Non-displaced PMI. Abd: Bowel sounds present. Soft, NT/ND without hepatosplenomegaly Ext: No lower extremity edema. Skin: Warm and dry without rash.  EKG: Sinus bradycardia  (heart rate 56 bpm) with left anterior fascicular block and anterolateral ST/T changes.  ST/T changes are more pronounced than on the most recent EKG but unchanged from the tracing dated 05/18/2017.[name]  Echocardiogram (12/08/2017 - personally reviewed): Normal LV size with moderate LVH.  LVEF 40-45% with inferior hypokinesis.  Calcified aortic valve with mild regurgitation.  Mild mitral regurgitation.  Mildly dilated RV with mildly reduced contraction.  Normal pulmonary artery and central venous pressure.  Lab Results  Component Value Date   WBC 9.3 04/14/2017   HGB 11.0 (L) 04/14/2017   HCT 35.4 (L) 04/14/2017   MCV 83 04/14/2017   PLT 303 04/14/2017    Lab Results  Component Value Date   NA 138 08/21/2017   K 4.2 08/21/2017   CL 96 08/21/2017   CO2 25 08/21/2017   BUN 16 08/21/2017   CREATININE 1.24 08/21/2017   GLUCOSE 113 (H) 08/21/2017   ALT 29 08/14/2017    Lab Results  Component Value Date   CHOL 221 (H) 08/14/2017   HDL 46 08/14/2017   LDLCALC 143 (H) 08/14/2017   LDLDIRECT 174.2 12/19/2011   TRIG  158 (H) 08/14/2017   CHOLHDL 4.8 08/14/2017    --------------------------------------------------------------------------------------------------  ASSESSMENT AND PLAN: Coronary artery disease without angina No symptoms to suggest worsening coronary insufficiency.  Continue current medications for secondary prevention.  Chronic systolic heart failure due to ischemic cardiomyopathy Mr. Iles appears euvolemic and well compensated with NYHA class II heart failure symptoms.  Echocardiogram today shows interval improvement in LV systolic function, which is now 40-45%.  We will continue current regimen of carvedilol and lisinopril, and spironolactone.  Escalation of carvedilol was preclude it by resting bradycardia.  He is reluctant to try Entresto due to cost.  I will check renal function and potassium today.  Paroxysmal atrial fibrillation Continue amiodarone and follow-up with  Dr. Rayann Heman.  Anticoagulation deferred given history of prior GI bleeding.  Hypertension Blood pressure upper normal to poorly elevated.  Continue current medications and will continue weaning clonidine in the future, as blood pressure allows.  Sodium restriction encouraged.  Hyperlipidemia Mr. Decuir is tolerating low-dose rosuvastatin.  I will recheck a lipid panel today.  Escalation of rosuvastatin may be needed if LDL is not at goal (less than 70).  Follow-up: Given my transition to the [place] office, I will have Mr. Cozart follow-up with Dr. Marlou Porch in 6 months.  Nelva Bush, MD 12/08/2017 7:50 PM

## 2017-12-08 NOTE — Patient Instructions (Addendum)
Medication Instructions:  Your physician recommends that you continue on your current medications as directed. Please refer to the Current Medication list given to you today.  -- If you need a refill on your cardiac medications before your next appointment, please call your pharmacy. --  Labwork:TODAY CMP LIPID DIRECT LDL  Testing/Procedures: None ordered  Follow-Up: Your physician wants you to follow-up in: 42 MONTHS with Dr Dawna Part will receive a reminder letter in the mail two months in advance. If you don't receive a letter, please call our office to schedule the follow-up appointment.  Thank you for choosing [facility]!!    Any Other Special Instructions Will Be Listed Below (If Applicable).

## 2017-12-09 LAB — COMPREHENSIVE METABOLIC PANEL
ALBUMIN: 4 g/dL (ref 3.5–4.8)
ALT: 19 IU/L (ref 0–44)
AST: 16 IU/L (ref 0–40)
Albumin/Globulin Ratio: 1.6 (ref 1.2–2.2)
Alkaline Phosphatase: 114 IU/L (ref 39–117)
BILIRUBIN TOTAL: 0.4 mg/dL (ref 0.0–1.2)
BUN / CREAT RATIO: 14 (ref 10–24)
BUN: 17 mg/dL (ref 8–27)
CALCIUM: 9 mg/dL (ref 8.6–10.2)
CHLORIDE: 96 mmol/L (ref 96–106)
CO2: 24 mmol/L (ref 20–29)
CREATININE: 1.22 mg/dL (ref 0.76–1.27)
GFR, EST AFRICAN AMERICAN: 66 mL/min/{1.73_m2} (ref >59)
GFR, EST NON AFRICAN AMERICAN: 57 mL/min/{1.73_m2} — AB (ref >59)
GLUCOSE: 145 mg/dL — AB (ref 65–99)
Globulin, Total: 2.5 g/dL (ref 1.5–4.5)
Potassium: 4 mmol/L (ref 3.5–5.2)
Sodium: 137 mmol/L (ref 134–144)
TOTAL PROTEIN: 6.5 g/dL (ref 6.0–8.5)

## 2017-12-09 LAB — LIPID PANEL
Chol/HDL Ratio: 4 ratio (ref 0.0–5.0)
Cholesterol, Total: 174 mg/dL (ref 100–199)
HDL: 44 mg/dL (ref >39)
LDL CALC: 99 mg/dL (ref 0–99)
Triglycerides: 156 mg/dL — ABNORMAL HIGH (ref 0–149)
VLDL CHOLESTEROL CAL: 31 mg/dL (ref 5–40)

## 2017-12-09 LAB — LDL CHOLESTEROL, DIRECT: LDL DIRECT: 106 mg/dL — AB (ref 0–99)

## 2017-12-11 ENCOUNTER — Telehealth: Payer: Self-pay

## 2017-12-11 DIAGNOSIS — E785 Hyperlipidemia, unspecified: Secondary | ICD-10-CM

## 2017-12-11 MED ORDER — ROSUVASTATIN CALCIUM 5 MG PO TABS: 7.5000 mg | ORAL_TABLET | Freq: Every day | ORAL | 3 refills | Status: DC

## 2017-12-11 NOTE — Telephone Encounter (Signed)
Notes recorded by Frederik Schmidt, RN on 12/11/2017 at 12:27 PM EDT Informed patient and his wife of lab results/recommendations. They verbalized understanding.

## 2017-12-11 NOTE — Telephone Encounter (Signed)
-----   Message from Nelva Bush, MD sent at 12/10/2017 12:37 PM EDT ----- Please let Mr. Jerry Gilbert know that his kidney function, liver function, and electrolytes are stable.  His LDL has improved to 99 but is still above our goal of <70.  I recommend that we try increasing rosuvastatin to 7.5 mg daily with repeat lipid panel and ALT in ~3 months.

## 2017-12-11 NOTE — Telephone Encounter (Signed)
-----   Message from Nelva Bush, MD sent at 12/10/2017 12:37 PM EDT ----- Please let Jerry Gilbert know that his kidney function, liver function, and electrolytes are stable.  His LDL has improved to 99 but is still above our goal of <70.  I recommend that we try increasing rosuvastatin to 7.5 mg daily with repeat lipid panel and ALT in ~3 months.

## 2017-12-11 NOTE — Telephone Encounter (Signed)
Notes recorded by Frederik Schmidt, RN on 12/11/2017 at 9:53 AM EDT [id number] 9/30 ------

## 2017-12-11 NOTE — Telephone Encounter (Signed)
-----   Message from Nelva Bush, MD sent at 12/10/2017 12:37 PM EDT ----- Please let Mr. Thoman know that his kidney function, liver function, and electrolytes are stable.  His LDL has improved to 99 but is still above our goal of <70.  I recommend that we try increasing rosuvastatin to 7.5 mg daily with repeat lipid panel and ALT in ~3 months.

## 2017-12-22 ENCOUNTER — Telehealth: Payer: Self-pay

## 2017-12-22 NOTE — Telephone Encounter (Signed)
**Note De-Identified Jerry Gilbert Obfuscation** The pts insurance does not want to pay for more than 1 tablet of Rosuvastatin daily. I have done a PA on Rosuvastatin 7.5 mg (1 and 1/2 tablet) daily.  Key: [id number]

## 2017-12-25 NOTE — Telephone Encounter (Signed)
**Note De-Identified Jachelle Fluty Obfuscation** Letter received from [facility] Celestino Ackerman fax stating that they have approved the pts Rosuvastatin PA. Approval good from 12/22/2017 until 12/23/2018.  Event# [id number]  I have notified the pts pharmacy.

## 2017-12-26 MED ORDER — VECURONIUM BROMIDE 10 MG IV SOLR
INTRAVENOUS | Status: AC
  Filled 2017-12-26: qty 20

## 2017-12-26 MED ORDER — ROCURONIUM BROMIDE 50 MG/5ML IV SOSY
PREFILLED_SYRINGE | INTRAVENOUS | Status: AC
  Filled 2017-12-26: qty 10

## 2018-02-12 ENCOUNTER — Encounter: Payer: Self-pay | Admitting: Internal Medicine

## 2018-02-12 ENCOUNTER — Other Ambulatory Visit: Payer: Medicare Other | Admitting: *Deleted

## 2018-02-12 ENCOUNTER — Ambulatory Visit
Admission: RE | Admit: 2018-02-12 | Discharge: 2018-02-12 | Disposition: A | Discharge status: Home / Self Care | Payer: Medicare Other | Source: Ambulatory Visit | Attending: Internal Medicine | Admitting: Internal Medicine

## 2018-02-12 ENCOUNTER — Ambulatory Visit (INDEPENDENT_AMBULATORY_CARE_PROVIDER_SITE_OTHER): Payer: Medicare Other | Admitting: Internal Medicine

## 2018-02-12 VITALS — BP 134/82 | HR 53 | Ht 67.0 in | Wt 172.4 lb

## 2018-02-12 DIAGNOSIS — Z79899 Other long term (current) drug therapy: Secondary | ICD-10-CM | POA: Diagnosis not present

## 2018-02-12 DIAGNOSIS — I480 Paroxysmal atrial fibrillation: Secondary | ICD-10-CM | POA: Diagnosis not present

## 2018-02-12 DIAGNOSIS — I255 Ischemic cardiomyopathy: Secondary | ICD-10-CM | POA: Diagnosis not present

## 2018-02-12 DIAGNOSIS — E785 Hyperlipidemia, unspecified: Secondary | ICD-10-CM

## 2018-02-12 LAB — ALT: ALT: 40 IU/L (ref 0–44)

## 2018-02-12 LAB — T4: T4 TOTAL: 8.2 ug/dL (ref 4.5–12.0)

## 2018-02-12 LAB — LIPID PANEL
CHOLESTEROL TOTAL: 192 mg/dL (ref 100–199)
Chol/HDL Ratio: 4.3 ratio (ref 0.0–5.0)
HDL: 45 mg/dL (ref >39)
LDL Calculated: 111 mg/dL — ABNORMAL HIGH (ref 0–99)
Triglycerides: 178 mg/dL — ABNORMAL HIGH (ref 0–149)
VLDL CHOLESTEROL CAL: 36 mg/dL (ref 5–40)

## 2018-02-12 LAB — TSH: TSH: 5.25 u[IU]/mL — ABNORMAL HIGH (ref 0.450–4.500)

## 2018-02-12 NOTE — Patient Instructions (Addendum)
Medication Instructions:  Your physician recommends that you continue on your current medications as directed. Please refer to the Current Medication list given to you today.  * If you need a refill on your cardiac medications before your next appointment, please call your pharmacy.   Labwork: Today: TSH, T4 *We will only notify you of abnormal results, otherwise continue current treatment plan.  Testing/Procedures: A chest x-ray takes a picture of the organs and structures inside the chest, including the heart, lungs, and blood vessels. This test can show several things, including, whether the heart is enlarges; whether fluid is building up in the lungs; and whether pacemaker / defibrillator leads are still in place.  Follow-Up: Your physician recommends that you schedule a follow-up appointment in: March with Dr. Marlou Porch.  Your physician wants you to follow-up in: 1 year with Dr. Rayann Heman. You will receive a reminder letter in the mail two months in advance. If you don't receive a letter, please call our office to schedule the follow-up appointment.    Thank you for choosing [facility]!!   [phone]

## 2018-02-12 NOTE — Progress Notes (Signed)
PCP: Wyatt Mage, MD Primary Cardiologist: Dr Marlou Porch (previously Dr End) Primary EP: Dr Rayann Heman  Jerry Gilbert is a 77 y.o. male who presents today for routine electrophysiology followup.  Since last being seen in our clinic, the patient reports doing very well.  Today, he denies symptoms of palpitations, chest pain, shortness of breath,  lower extremity edema, dizziness, presyncope, or syncope.  The patient is otherwise without complaint today.   Past Medical History:  Diagnosis Date  . AAA (abdominal aortic aneurysm) ([place]) 11/04/09   3.6 cmx 3.4 cm  . Anemia   . Atrial tachycardia ([place])   . CAD (coronary artery disease)    S/P PCI LAD and subsequent CABG  . Chaotic atrial rhythm   . Dysrhythmia   . H/O transfusion of packed red blood cells    16 units of blood in 2014 d/t GI bleed  . H/O: GI bleed   . Heart murmur    in 1961  . Hypertension   . Ischemic cardiomyopathy   . Kidney stones   . Myocardial infarction ([place])   . Nocturia   . Personal history of unspecified circulatory disease    Transient Ischemic attacks  . Prostate cancer ([place])   . Shortness of breath dyspnea    'with minimal exertion'  . Stroke [facility])    "small TIA"  . Tobacco abuse    Past Surgical History:  Procedure Laterality Date  . ABDOMINAL AORTIC ANEURYSM REPAIR N/A 12/15/2014   Procedure: ABDOMINAL AORTIC ANEURYSM REPAIR;  Surgeon: Elam Dutch, MD;  Location: [facility];  Service: Vascular;  Laterality: N/A;  . Bare-metal stent     Left circumflex about 10 years ago  . CARDIAC CATHETERIZATION     11/05/2008  . CORONARY ARTERY BYPASS GRAFT  03/10/2017   LIMA-LAD, SVG-D, SVG-OM, SVG-PDA  . FEMORAL-POPLITEAL BYPASS GRAFT Left 05/20/2015   Procedure: LEFT COMMON FEMORAL ARTERY ANEURYSM REPAIR;  Surgeon: Elam Dutch, MD;  Location: [facility];  Service: Vascular;  Laterality: Left;  . PERCUTANEOUS CORONARY STENT INTERVENTION (PCI-S)     LAD  . PROSTATE SURGERY    . TONSILLECTOMY      ROS- all  systems are reviewed and negatives except as per HPI above  Current Outpatient Medications  Medication Sig Dispense Refill  . amiodarone (PACERONE) 200 MG tablet Take 200 mg by mouth daily.    [name] aspirin EC 81 MG tablet Take 1 tablet (81 mg total) by mouth daily. 90 tablet 3  . carvedilol (COREG) 6.25 MG tablet Take 1 tablet (6.25 mg total) by mouth 2 (two) times daily. 180 tablet 3  . cloNIDine (CATAPRES) 0.2 MG tablet Take 1 tablet (0.2 mg total) by mouth 2 (two) times daily. 180 tablet 3  . furosemide (LASIX) 20 MG tablet Take 1 tablet (20 mg total) by mouth daily. 90 tablet 3  . Multiple Vitamins-Minerals (MULTIVITAMIN WITH MINERALS) tablet Take 1 tablet by mouth daily.    . nitroGLYCERIN (NITROSTAT) 0.4 MG SL tablet Place 1 tablet (0.4 mg total) under the tongue every 5 (five) minutes as needed for chest pain. 25 tablet 6  . pantoprazole (PROTONIX) 40 MG tablet Take 1 tablet (40 mg total) by mouth 2 (two) times daily. 180 tablet 3  . rosuvastatin (CRESTOR) 5 MG tablet Take 1.5 tablets (7.5 mg total) by mouth daily. 90 tablet 3  . spironolactone (ALDACTONE) 25 MG tablet Take 1 tablet (25 mg total) by mouth daily. 90 tablet 3  . lisinopril (  PRINIVIL,ZESTRIL) 20 MG tablet Take 1 tablet (20 mg total) by mouth 2 (two) times daily. 180 tablet 3   No current facility-administered medications for this visit.     Physical Exam: Vitals:   02/12/18 1052  BP: 134/82  Pulse: (!) 53  SpO2: 99%  Weight: 172 lb 6.4 oz (78.2 kg)  Height: 5\' 7"  (1.702 m)    GEN- The patient is well appearing, alert and oriented x 3 today.   Head- normocephalic, atraumatic Eyes-  Sclera clear, conjunctiva pink Ears- hearing intact Oropharynx- clear Lungs- Clear to ausculation bilaterally, normal work of breathing Heart- Regular rate and rhythm, no murmurs, rubs or gallops, PMI not laterally displaced GI- soft, NT, ND, + BS Extremities- no clubbing, cyanosis, or edema  Wt Readings from Last 3 Encounters:    02/12/18 172 lb 6.4 oz (78.2 kg)  12/08/17 172 lb (78 kg)  08/14/17 179 lb (81.2 kg)    EKG tracing ordered today is personally reviewed and shows sinus rhythm 53 bpm, PR 148 msec, QRS 116 msec, QTc 420 msec, LAHB  Assessment and Plan:  1.  Atrial fibrillation Doing well with amiodarone LFTs reviewed Will need to check TFTs and CXR Not on anticoagulation due to prior GI Bleed  2. CAD/ ischemic CM S/p CABG Doing well No ischemic symptoms euvolemic EF has improved to 40-45% with medical therapy (echo 12/08/17 reviewed)  3. HTN Stable No change required today  4. Tobacco Remains quit  Follow-up with DR Marlou Porch in 3-4 months I will see in a year  Thompson Grayer MD, [facility] 02/12/2018 10:59 AM

## 2018-02-13 ENCOUNTER — Telehealth: Payer: Self-pay

## 2018-02-13 DIAGNOSIS — Z79899 Other long term (current) drug therapy: Secondary | ICD-10-CM

## 2018-02-13 DIAGNOSIS — I25118 Atherosclerotic heart disease of native coronary artery with other forms of angina pectoris: Secondary | ICD-10-CM

## 2018-02-13 DIAGNOSIS — E785 Hyperlipidemia, unspecified: Secondary | ICD-10-CM

## 2018-02-13 DIAGNOSIS — I2510 Atherosclerotic heart disease of native coronary artery without angina pectoris: Secondary | ICD-10-CM

## 2018-02-13 MED ORDER — ROSUVASTATIN CALCIUM 10 MG PO TABS: 10.0000 mg | ORAL_TABLET | Freq: Every day | ORAL | 3 refills | Status: DC

## 2018-02-13 NOTE — Telephone Encounter (Signed)
Pt given Lipid results and Dr. Darnelle Bos recommendation to increase the Rosuvastatin to 10mg  a day.. Pt will call if he develops any problems he reports that he has been doing well on the 7.5mg .... Will have labs drawn when he sees Dr. Marlou Porch in 05/2018.[name] Pt will try to make early morning appt and come fasting. Advised pt wife that we will call back after Dr. Rayann Heman reviews his thyroid labs.

## 2018-02-13 NOTE — Telephone Encounter (Signed)
-----   Message from Nelva Bush, MD sent at 02/13/2018  6:51 AM EST ----- Please let Mr. Janczak know that his LDL has actually gone up and remains elevated (goal < 70).  If he is tolerating the increased dose of rosuvastatin well, I recommend that we increase it further to 10 mg daily and repeat a lipid panel and ALT in ~3 months.

## 2018-04-06 ENCOUNTER — Other Ambulatory Visit: Payer: Self-pay | Admitting: Internal Medicine

## 2018-05-04 ENCOUNTER — Other Ambulatory Visit: Payer: Self-pay | Admitting: Internal Medicine

## 2018-05-29 ENCOUNTER — Other Ambulatory Visit: Payer: Self-pay | Admitting: Internal Medicine

## 2018-05-30 ENCOUNTER — Other Ambulatory Visit: Payer: Self-pay | Admitting: Internal Medicine

## 2018-05-30 NOTE — Telephone Encounter (Signed)
Please review for refill.  

## 2018-06-20 ENCOUNTER — Telehealth: Payer: Self-pay | Admitting: *Deleted

## 2018-06-20 NOTE — Telephone Encounter (Signed)
Maren Beach, Adair Laundry, CMA  Shellia Cleverly, RN        I was able to speak to him and his wife. They are going to try to download Webex onto their computer. I rescheduled his appt for April 16 at 3:00pm but he has needs lab to be done. Please advise. also his wife said something about giving him additional lasix to help him with some fluid or mucus but if you can please give them a call either today or tomorrow, that would help. Thank you.    Pt is to have fasting lipid and hepatic panel prior to appt.  OK to hold off on this for now if necessary.

## 2018-06-20 NOTE — Telephone Encounter (Signed)
Spoke with wife who is an Therapist, sports.  Recently pt has been having an increased cough with sputum production.  She listened to his lungs and heard some crackles in the left upper lobe.  She gave him Furosemide 20 mg X 2 days and for the last month she has been giving him 20 mg every 3rd day.  This seems to have helped but she feels he my need more.  According to his medication list Furosemide 20 mg daily is ordered.  Advised wife of this.  She is going to increase this to every other day to see if there is any improvement.  She will increase foods high in potassium on days he take Furosemide.  She will watch for signs of dehydration as well.  She denies patient has any edema in feet/legs.  Unable to determine about his abdomen but he does become SOB with limited activities.  Pt is due for lipid and liver panel.  A BMP will be added to this when he has his lab work drawn.  They live in [place], 2 hours away and would like to have his blood work drawn there.  Wife will call pt's PCP to see if he can have it drawn in their office.  Advised I will send an order with DX codes and our fax # for them to send results once she finds out if they will draw it.  She will c/b with their fax number.  Wife very appreciative of the call and information.  Will forward to Dr Marlou Porch for his information and review.

## 2018-06-21 NOTE — Telephone Encounter (Signed)
Follow Up:    Wife says she needs to talk to somebody about his e-visit on 06-28-18.

## 2018-06-21 NOTE — Telephone Encounter (Signed)
See pt advice request.  Pt would like to reschedule for summer if not urgent and labs don't need to be done right now.

## 2018-06-26 NOTE — Telephone Encounter (Signed)
Called and spoke with both patient and his wife.  Pt would like to cancel his appt for now and reschedule for later in the summer.  Appt scheduled for 7/20.  He is having his Lipid and Liver panel checked at his PCP office and will have them seen the results to Dr Marlou Porch.  Pt will c/b prior to his appt if any questions or concerns arise.

## 2018-06-28 ENCOUNTER — Telehealth: Payer: Medicare Other | Admitting: Cardiology

## 2018-06-29 ENCOUNTER — Ambulatory Visit: Payer: Medicare Other | Admitting: Cardiology

## 2018-06-29 ENCOUNTER — Other Ambulatory Visit: Payer: Medicare Other

## 2018-07-03 ENCOUNTER — Other Ambulatory Visit: Payer: Self-pay | Admitting: Internal Medicine

## 2018-07-13 ENCOUNTER — Telehealth: Payer: Self-pay

## 2018-07-13 ENCOUNTER — Telehealth: Payer: Self-pay | Admitting: Cardiology

## 2018-07-13 NOTE — Telephone Encounter (Signed)
New Message             Patient's wife is calling to discuss his lab work also patienst kidney function is worsening.per his wife. Pls call to advise.

## 2018-07-13 NOTE — Telephone Encounter (Signed)
Since his medications and labs are currently being managed by his PCP, I would advise that they call their PCP for medication guidance and for symptoms of fatigue.  Please reschedule their virtual visit with Dr. Marlou Porch.

## 2018-07-13 NOTE — Telephone Encounter (Signed)
Spoke with pts wife, she stated that they canceled their recent virtual visit and decided to have the necessary lab work done in [place] which is where they live.   He saw Dr. Lurlean Nanny, PCP this week and was told his creatinine had gone up to 2.1 and Dr. Lurlean Nanny wants to do a renal US upcoming Monday and re check labs on upcoming Wednesday. PCP MD advised pt to stop Lasix and only take half dose of Lisinopril.   Spouse reports pt BP of 80/40 this afternoon, pt feels tired but no other symptoms. Denies CP, diaphoresis or dizziness. Reports difficulties performing ADL's without extreme fatigue. Pt has been eating and drinking about half as much as usual. Advised spouse to retake pt BP on the other arm which was 130/80 but pt just had a BM.   Will route to [facility] for advisement. Pt has been seen by [facility] and was supposed to be seen by Dr. Marlou Porch for the first time recently but the appt was canceled by pt.

## 2018-07-13 NOTE — Telephone Encounter (Signed)
Called [name] back regarding [name] advisement to reach out to PCP about med changes. [name] is agreeable and understands to give [place] a call back if pt is symptomatic.

## 2018-07-17 NOTE — Telephone Encounter (Signed)
Wilma Flavin, RN       07/13/18 4:21 PM  Note    Called Mrs. Bari back regarding [name] advisement to reach out to PCP about med changes. Mrs. Mastrangelo is agreeable and understands to give [place] a call back if pt is symptomatic.

## 2018-07-27 NOTE — Telephone Encounter (Signed)
I spoke with the patient and his wife, who report intermittent hypotension as well as anorexia over the last several weeks.  Mr. Nylund also had an episode of lightheadedness and shortness of breath about 3 weeks ago while in the shower.  He was subsequently evaluated by his PCP at [facility] and noted to have acute kidney injury with a creatinine of 2.3.  Lisinopril was decreased with improvement in creatinine to 1.6.  However, due to continued intermittent low blood pressures, patient's wife has actually stopped lisinopril for her husband altogether yesterday.  He also off furosemide out of concern for dehydration.  He notes a cough with chest congestion over the last several months, though this seems to have improved with discontinuation of lisinopril.  He still has some low blood pressures, down to 70/40 last night.  He denies chest pain or further episodes of shortness of breath.  I have recommended discontinuation of spironolactone and decrease dose of clonidine to 0.1 mg twice daily pending further evaluation.  We will proceed with virtual visit on Monday to reassess how Mr. Haydu is doing and determine if he needs to be seen in the office or referred for additional testing.  Nelva Bush, MD [facility] Pager: [phone]

## 2018-07-30 ENCOUNTER — Telehealth: Payer: Self-pay | Admitting: Internal Medicine

## 2018-07-30 ENCOUNTER — Telehealth: Payer: Self-pay | Admitting: *Deleted

## 2018-07-30 ENCOUNTER — Other Ambulatory Visit: Payer: Self-pay

## 2018-07-30 ENCOUNTER — Telehealth: Payer: Self-pay | Admitting: Cardiology

## 2018-07-30 ENCOUNTER — Encounter: Payer: Self-pay | Admitting: Internal Medicine

## 2018-07-30 ENCOUNTER — Telehealth (INDEPENDENT_AMBULATORY_CARE_PROVIDER_SITE_OTHER): Payer: Medicare Other | Admitting: Internal Medicine

## 2018-07-30 VITALS — BP 160/80 | HR 52 | Ht 69.0 in | Wt 175.0 lb

## 2018-07-30 DIAGNOSIS — I5022 Chronic systolic (congestive) heart failure: Secondary | ICD-10-CM | POA: Diagnosis not present

## 2018-07-30 DIAGNOSIS — N179 Acute kidney failure, unspecified: Secondary | ICD-10-CM

## 2018-07-30 DIAGNOSIS — I10 Essential (primary) hypertension: Secondary | ICD-10-CM

## 2018-07-30 DIAGNOSIS — E785 Hyperlipidemia, unspecified: Secondary | ICD-10-CM

## 2018-07-30 DIAGNOSIS — I480 Paroxysmal atrial fibrillation: Secondary | ICD-10-CM | POA: Diagnosis not present

## 2018-07-30 DIAGNOSIS — I255 Ischemic cardiomyopathy: Secondary | ICD-10-CM | POA: Diagnosis not present

## 2018-07-30 DIAGNOSIS — I2510 Atherosclerotic heart disease of native coronary artery without angina pectoris: Secondary | ICD-10-CM

## 2018-07-30 MED ORDER — CLONIDINE HCL 0.1 MG PO TABS: 0.1000 mg | ORAL_TABLET | Freq: Two times a day (BID) | ORAL | 3 refills | Status: DC

## 2018-07-30 NOTE — Telephone Encounter (Signed)
LMOV to schedule a virtual visit this afternoon with Dr Saunders Revel

## 2018-07-30 NOTE — Telephone Encounter (Signed)
  Patient saw Dr End today and he is setting Jerry Gilbert up to wear a holter monitor for two weeks. Dr End said after that two weeks he wanted the patient to be seen by Dr Marlou Porch in the office to go over the results in about six weeks. Patient does have an appt with Dr Gillian Shields on 10/01/18 has a regular office visit. Patient wants to make sure this is not going to be changed to virtual visit. Please advise.

## 2018-07-30 NOTE — Telephone Encounter (Signed)
Dr Marlou Porch is DOD that day and scheduled to be in the office.  Pt aware we are in the office that day however I have no control over what the future holds r/t corona-virus or schedule changes.  He states understanding

## 2018-07-30 NOTE — Patient Instructions (Signed)
Medication Instructions:  Your physician has recommended you make the following change in your medication:  TAKE Clonidine 0.1 mg by mouth two times a day. If Blood pressure is greater than 150/90, then take clonidine 0.2 mg by mouth two times a day.  If you need a refill on your cardiac medications before your next appointment, please call your pharmacy.   Lab work: none If you have labs (blood work) drawn today and your tests are completely normal, you will receive your results only by: [name] MyChart Message (if you have MyChart) OR . A paper copy in the mail If you have any lab test that is abnormal or we need to change your treatment, we will call you to review the results.  Testing/Procedures: Your physician has recommended that you wear an 14 DAY ZIO event monitor. Event monitors are medical devices that record the heart's electrical activity. Doctors most often [place] these monitors to diagnose arrhythmias. Arrhythmias are problems with the speed or rhythm of the heartbeat. The monitor is a small, portable device. You can wear one while you do your normal daily activities. This is usually used to diagnose what is causing palpitations/syncope (passing out).  This will be directly shipped to you from [facility]- they will call you just prior to receipt of the monitor or when you actually receive the monitor.  If you get a call from an 800# or a 224 area code in the next 2-5 business days, then please answer the call.    Follow-Up: At [facility], you and your health needs are our priority.  As part of our continuing mission to provide you with exceptional heart care, we have created designated Provider Care Teams.  These Care Teams include your primary Cardiologist (physician) and Advanced Practice Providers (APPs -  Physician Assistants and Nurse Practitioners) who all work together to provide you with the care you need, when you need it. You will need a follow up appointment in 5-6 weeks at the  [facility] office.  Please call our office 2 months in advance to schedule this appointment.  You may see Dr Marlou Porch or one of the following Advanced Practice Providers on your designated Care Team:   Truitt Merle, NP Cecilie Kicks, NP . Kathyrn Drown, NP

## 2018-07-30 NOTE — Telephone Encounter (Signed)
-----   Message from Lamar Laundry, RN sent at 07/30/2018  7:42 AM EDT ----- Regarding: pt neds a virtual visit with Dr. Saunders Revel today Per Dr. Saunders Revel Please call this pt to schedule a virtual visit with him today.

## 2018-07-30 NOTE — Telephone Encounter (Signed)
Spoke with patient's wife, ok per DPR. She verbalized understanding of AVS instructions as listed from today's virtual visit with Dr End. She is aware of the process for receiving the ZIO monitor. They have a PO box and is aware it cannot be delivered there. They're physical address is [address] [place] [address].  Registered on ZIO.

## 2018-07-30 NOTE — Telephone Encounter (Signed)
Virtual Visit Pre-Appointment Phone Call  "(Name), I am calling you today to discuss your upcoming appointment. We are currently trying to limit exposure to the virus that causes COVID-19 by seeing patients at home rather than in the office."  1. "What is the BEST phone number to call the day of the visit?" - include this in appointment notes  2. Do you have or have access to (through a family member/friend) a smartphone with video capability that we can use for your visit?" a. If yes - list this number in appt notes as cell (if different from BEST phone #) and list the appointment type as a VIDEO visit in appointment notes b. If no - list the appointment type as a PHONE visit in appointment notes  3. Confirm consent - "In the setting of the current Covid19 crisis, you are scheduled for a (phone or video) visit with your provider on (date) at (time).  Just as we do with many in-office visits, in order for you to participate in this visit, we must obtain consent.  If you'd like, I can send this to your mychart (if signed up) or email for you to review.  Otherwise, I can obtain your verbal consent now.  All virtual visits are billed to your insurance company just like a normal visit would be.  By agreeing to a virtual visit, we'd like you to understand that the technology does not allow for your provider to perform an examination, and thus may limit your provider's ability to fully assess your condition. If your provider identifies any concerns that need to be evaluated in person, we will make arrangements to do so.  Finally, though the technology is pretty good, we cannot assure that it will always work on either your or our end, and in the setting of a video visit, we may have to convert it to a phone-only visit.  In either situation, we cannot ensure that we have a secure connection.  Are you willing to proceed?" STAFF: Did the patient verbally acknowledge consent to telehealth visit? Document  YES/NO here: YES  4. Advise patient to be prepared - "Two hours prior to your appointment, go ahead and check your blood pressure, pulse, oxygen saturation, and your weight (if you have the equipment to check those) and write them all down. When your visit starts, your provider will ask you for this information. If you have an Apple Watch or Kardia device, please plan to have heart rate information ready on the day of your appointment. Please have a pen and paper handy nearby the day of the visit as well."  5. Give patient instructions for MyChart download to smartphone OR Doximity/Doxy.me as below if video visit (depending on what platform provider is using)  6. Inform patient they will receive a phone call 15 minutes prior to their appointment time (may be from unknown caller ID) so they should be prepared to answer    TELEPHONE CALL NOTE  Tanav L Mau has been deemed a candidate for a follow-up tele-health visit to limit community exposure during the Covid-19 pandemic. I spoke with the patient via phone to ensure availability of phone/video source, confirm preferred email & phone number, and discuss instructions and expectations.  I reminded Jerry Gilbert to be prepared with any vital sign and/or heart rhythm information that could potentially be obtained via home monitoring, at the time of his visit. I reminded Jerry Gilbert to expect a phone call prior to  his visit.  Caryl Pina Gerringer 07/30/2018 1:14 PM   INSTRUCTIONS FOR DOWNLOADING THE MYCHART APP TO SMARTPHONE  - The patient must first make sure to have activated MyChart and know their login information - If Apple, go to [employer] and type in MyChart in the search bar and download the app. If Android, ask patient to go to [employer] and type in [place] in the search bar and download the app. The app is free but as with any other app downloads, their phone may require them to verify saved payment information or Apple/Android  password.  - The patient will need to then log into the app with their MyChart username and password, and select [facility] as their healthcare provider to link the account. When it is time for your visit, go to the MyChart app, find appointments, and click Begin Video Visit. Be sure to Select Allow for your device to access the Microphone and Camera for your visit. You will then be connected, and your provider will be with you shortly.  **If they have any issues connecting, or need assistance please contact MyChart service desk [phone])**  **If using a computer, in order to ensure the best quality for their visit they will need to use either of the following Internet Browsers: [employer], or Google Chrome**  IF USING DOXIMITY or DOXY.ME - The patient will receive a link just prior to their visit by text.     FULL LENGTH CONSENT FOR TELE-HEALTH VISIT   I hereby voluntarily request, consent and authorize [facility] and its employed or contracted physicians, physician assistants, nurse practitioners or other licensed health care professionals (the Practitioner), to provide me with telemedicine health care services (the Services") as deemed necessary by the treating Practitioner. I acknowledge and consent to receive the Services by the Practitioner via telemedicine. I understand that the telemedicine visit will involve communicating with the Practitioner through live audiovisual communication technology and the disclosure of certain medical information by electronic transmission. I acknowledge that I have been given the opportunity to request an in-person assessment or other available alternative prior to the telemedicine visit and am voluntarily participating in the telemedicine visit.  I understand that I have the right to withhold or withdraw my consent to the use of telemedicine in the course of my care at any time, without affecting my right to future care or treatment,  and that the Practitioner or I may terminate the telemedicine visit at any time. I understand that I have the right to inspect all information obtained and/or recorded in the course of the telemedicine visit and may receive copies of available information for a reasonable fee.  I understand that some of the potential risks of receiving the Services via telemedicine include:   Delay or interruption in medical evaluation due to technological equipment failure or disruption;  Information transmitted may not be sufficient (e.g. poor resolution of images) to allow for appropriate medical decision making by the Practitioner; and/or   In rare instances, security protocols could fail, causing a breach of personal health information.  Furthermore, I acknowledge that it is my responsibility to provide information about my medical history, conditions and care that is complete and accurate to the best of my ability. I acknowledge that Practitioner's advice, recommendations, and/or decision may be based on factors not within their control, such as incomplete or inaccurate data provided by me or distortions of diagnostic images or specimens that may result from electronic transmissions. I understand  that the practice of medicine is not an exact science and that Practitioner makes no warranties or guarantees regarding treatment outcomes. I acknowledge that I will receive a copy of this consent concurrently upon execution via email to the email address I last provided but may also request a printed copy by calling the office of [facility].    I understand that my insurance will be billed for this visit.   I have read or had this consent read to me.  I understand the contents of this consent, which adequately explains the benefits and risks of the Services being provided via telemedicine.   I have been provided ample opportunity to ask questions regarding this consent and the Services and have had my questions  answered to my satisfaction.  I give my informed consent for the services to be provided through the use of telemedicine in my medical care  By participating in this telemedicine visit I agree to the above.

## 2018-07-30 NOTE — Progress Notes (Signed)
Virtual Visit via Video Note   This visit type was conducted due to national recommendations for restrictions regarding the COVID-19 Pandemic (e.g. social distancing) in an effort to limit this patient's exposure and mitigate transmission in our community.  Due to his co-morbid illnesses, this patient is at least at moderate risk for complications without adequate follow up.  This format is felt to be most appropriate for this patient at this time.  All issues noted in this document were discussed and addressed.  A limited physical exam was performed with this format.  Please refer to the patient's chart for his consent to telehealth for [facility].   Date:  07/30/2018   ID:  Jerry Gilbert, DOB 11/26/1940, MRN [id number]  Patient Location: Home Provider Location: Home  PCP:  Wyatt Mage, MD  Cardiologist:  Nelva Bush, MD  Electrophysiologist:  None   Evaluation Performed:  Follow-Up Visit  Chief Complaint:  Shortness of breath, lightheadedness, palpitations, and blood pressure problems  History of Present Illness:    Jerry Gilbert is a 78 y.o. male with history of coronary artery disease status post CABG (02/2017), ischemic cardiomyopathy (LVEF 40% in 08/2015), AAA status post repair, paroxysmal atrial fibrillation, hypertension, hyperlipidemia, and COPD.  We are speaking today for further evaluation of lightheadedness, fatigue, and blood pressure changes.  I spoke with the patient and his wife 3 days ago by phone regarding several concerns.  He reported an episode of shortness of breath and lightheadedness that occurred in the shower about 3 weeks ago.  He was also noted to have acute kidney injury in late April by his PCP, prompting de-escalation and ultimate discontinuation of lisinopril.  Blood pressures have also been somewhat low, which prompted me to recommend discontinuation of spironolactone and dose reduction of clonidine to 0.1 mg twice daily.  Today, [name] reports  that he is feeling well, though his wife feels like he is still not back to baseline.  [name] notes at least 2-3 episodes of shortness of breath and lightheadedness over the last month, which have happened with random activities (e.g. showering and sitting on a riding mower).  During the most recent episode, [name] felt as though his heart was pounding though he was not doing anything particularly strenuous.  [name] denies chest pain, orthopnea, and edema.  His weight has been stable.  He is not taking any furosemide at this time, given recent AKI.  BP has trended up since we spoke 3 days ago (with SBP up to 160 mmHg earlier today).  The patient does not have symptoms concerning for COVID-19 infection (fever, chills, cough, or new shortness of breath).    Past Medical History:  Diagnosis Date  . AAA (abdominal aortic aneurysm) ([place]) 11/04/09   3.6 cmx 3.4 cm  . Anemia   . Atrial tachycardia ([place])   . CAD (coronary artery disease)    S/P PCI LAD and subsequent CABG  . Chaotic atrial rhythm   . Dysrhythmia   . H/O transfusion of packed red blood cells    16 units of blood in 2014 d/t GI bleed  . H/O: GI bleed   . Heart murmur    in 1961  . Hypertension   . Ischemic cardiomyopathy   . Kidney stones   . Myocardial infarction ([place])   . Nocturia   . Personal history of unspecified circulatory disease    Transient Ischemic attacks  . Prostate cancer ([place])   . Shortness  of breath dyspnea    'with minimal exertion'  . Stroke [facility])    "small TIA"  . Tobacco abuse    Past Surgical History:  Procedure Laterality Date  . ABDOMINAL AORTIC ANEURYSM REPAIR N/A 12/15/2014   Procedure: ABDOMINAL AORTIC ANEURYSM REPAIR;  Surgeon: Elam Dutch, MD;  Location: [place];  Service: Vascular;  Laterality: N/A;  . Bare-metal stent     Left circumflex about 10 years ago  . CARDIAC CATHETERIZATION     11/05/2008  . CORONARY ARTERY BYPASS GRAFT  03/10/2017   LIMA-LAD, SVG-D, SVG-OM, SVG-PDA   . FEMORAL-POPLITEAL BYPASS GRAFT Left 05/20/2015   Procedure: LEFT COMMON FEMORAL ARTERY ANEURYSM REPAIR;  Surgeon: Elam Dutch, MD;  Location: [place];  Service: Vascular;  Laterality: Left;  . PERCUTANEOUS CORONARY STENT INTERVENTION (PCI-S)     LAD  . PROSTATE SURGERY    . TONSILLECTOMY       Current Meds  Medication Sig  . amiodarone (PACERONE) 200 MG tablet Take 1 tablet by mouth once daily  . aspirin EC 81 MG tablet Take 1 tablet (81 mg total) by mouth daily.  . carvedilol (COREG) 6.25 MG tablet Take 1 tablet by mouth twice daily  . cloNIDine (CATAPRES) 0.2 MG tablet TAKE 1 TABLET BY MOUTH TWICE DAILY (Patient taking differently: Take 0.1 mg by mouth 2 (two) times daily. )  . Multiple Vitamins-Minerals (MULTIVITAMIN WITH MINERALS) tablet Take 1 tablet by mouth daily.  . pantoprazole (PROTONIX) 40 MG tablet TAKE 1 TABLET BY MOUTH TWICE DAILY  . rosuvastatin (CRESTOR) 10 MG tablet Take 1 tablet (10 mg total) by mouth daily.     Allergies:   Celecoxib; Diclofenac; Statins; Warfarin and related; and Morphine and related   Social History   Tobacco Use  . Smoking status: Former Smoker    Packs/day: 1.00    Years: 80.00    Pack years: 80.00    Types: Cigarettes    Last attempt to quit: 03/06/2017    Years since quitting: 1.4  . Smokeless tobacco: Never Used  Substance Use Topics  . Alcohol use: No    Alcohol/week: 0.0 standard drinks    Comment: " No alcohol in over a year."  . Drug use: No    Comment: No illicit drug use and only takes multivitamin as far as herbal medication goes.     Family Hx: The patient's family history includes Arrhythmia in his brother; Coronary artery disease in his brother; Diabetes in his brother; Heart attack (age of onset: 3) in his mother; Heart disease in his brother, father, and mother; Hyperlipidemia in his brother; Hypertension in his brother.  ROS:   Please see the history of present illness.  All other systems reviewed and are  negative.   Prior CV studies:   The following studies were reviewed today:  Echo (12/08/2017): Normal LV size with moderate LVH.  LVEF 40-45% with inferior hypokinesis.  Aortic sclerosis with mild regurgitation.  Degenerative mitral valve disease with mild regurgitation.  Mildly dilated right ventricle with mildly reduced systolic function.  Mildly dilated left atrium.  Hypermobile intra-atrial septum.  Labs/Other Tests and Data Reviewed:    EKG:  No ECG reviewed.  Recent Labs: 12/08/2017: BUN 17; Creatinine, Ser 1.22; Potassium 4.0; Sodium 137 02/12/2018: ALT 40; TSH 5.250   Outside labs (07/09/2018): Sodium 134, potassium 5.0, chloride 96, CO2 27, BUN 33, creatinine 2.3  Outside labs (07/18/2018): Sodium 133, potassium 4.6, chloride 97, CO2 29, BUN 22, creatinine 1.6  Recent Lipid Panel Lab Results  Component Value Date/Time   CHOL 192 02/12/2018 09:46 AM   TRIG 178 (H) 02/12/2018 09:46 AM   HDL 45 02/12/2018 09:46 AM   CHOLHDL 4.3 02/12/2018 09:46 AM   CHOLHDL 6 09/01/2014 11:00 AM   LDLCALC 111 (H) 02/12/2018 09:46 AM   LDLDIRECT 106 (H) 12/08/2017 12:44 PM   LDLDIRECT 174.2 12/19/2011 09:08 AM    Wt Readings from Last 3 Encounters:  07/30/18 175 lb (79.4 kg)  02/12/18 172 lb 6.4 oz (78.2 kg)  12/08/17 172 lb (78 kg)     Objective:    Vital Signs:  BP (!) 160/80 (BP Location: Left Arm, Patient Position: Sitting, Cuff Size: Normal)   Pulse (!) 52   Ht 5\' 9"  (1.753 m)   Wt 175 lb (79.4 kg)   BMI 25.84 kg/m    VITAL SIGNS:  reviewed GEN:  no acute distress  ASSESSMENT & PLAN:    Coronary artery disease: Though Mr. Rafter denies chest pain, his episodic dyspnea and generalized fatigue could be manifestations of worsening coronary insufficiency and/or heart failure.  However, we will proceed with ambulatory cardiac monitoring to excluded PAF or other arrhythmia contributing to his symptoms.  If monitor is unrevealing, ischemia evaluation will need to be considered.   Chronic systolic heart failure due to ischemic cardiomyopathy: Mr. Boivin reports stable weight and no significant edema.  A month or two ago, Mr. Mapps' wife had her husband begin taking furosemide 3 days a week due to cough and chest congestions that began in December.  In retrospect, they are now concerned that diuresis may have precipitated recent AKI.  I think it is reasonable to use furosemide only for documented weight gain or obvious edema on a prn basis.  Due to cough and recent AKI, we will continue to hold lisinopril and spironolactone.  BMP should be repeated when Mr. Kerney follows up.  If renal function is near baseline, addition of an ARB should be considered.  Paroxysmal atrial fibrillation: Recent episodic dyspnea, lightheadedness, and palpitations could represent recurrent PAF.  We will obtain a 14-day event monitor for further assessment.  Amiodarone and carvedilol will be continued.  Mr. Eleazer is not on anticoagulation due to history of GI bleed while on warfarin.  Hypertension: Blood pressure has been labile over the last several weeks.  It is modestly elevated today.  I have recommended taking clonidine BID: 0.2 mg if BP > 150/90, otherwise 0.1 mg.  Carvedilol 6.25 mg BID will be continued.  If BP remains normal/elevated, addition of an ARB should be considered if renal function allows when Mr. Schack follows up.  Acute kidney injury: Significant bump in creatinine noted in late April on labs obtained by PCP.  This may have been brought on by self-titration of furosemide by Mr. Huckaba and his wife.  Repeat creatinine was better on 5/6, though still above baseline.  This prompted [place] to hold lisinopril and spironolactone last week.  A repeat BMP should be checked when Mr. Bartnick is seen for follow-up.  His wife is concerned that dose escalation of rosuvastatin may have precipitated AKI.  I have counseled Mr. Hodsdon and his wife that this is very unlikely and that continued lipid control will  be important for secondary prevention of CAD.  Hyperlipidemia: Continue rosuvastatin 10 mg daily, given history of intolerance to other statin regimens in the past.  Most recent LDL was still above goal on 07/09/2018 at [facility] (LDL 114).  Further  dose escalation versus initiation of PCSK9 inhibitor will need to be discussed at follow-up.  COVID-19 Education: The signs and symptoms of COVID-19 were discussed with the patient and how to seek care for testing (follow up with PCP or arrange E-visit).  The importance of social distancing was discussed today.  Time:   Today, I have spent 26 minutes with the patient with telehealth technology discussing the above problems.  An additional 10 minutes were spent reviewing the patient's chart and documenting today's encounter.    Medication Adjustments/Labs and Tests Ordered: Current medicines are reviewed at length with the patient today.  Concerns regarding medicines are outlined above.   Tests Ordered: 14-day Ziopatch event monitor.  Medication Changes: Clondine 0.2 mg BID for BP > 150/90, otherwise 0.1 mg BID.  Disposition:  Follow up in 6 week(s) with Dr. Marlou Porch or APP at [facility]. office, given my transition to [place].  Signed, Nelva Bush, MD  07/30/2018 3:12 PM    [place]

## 2018-08-07 ENCOUNTER — Ambulatory Visit (INDEPENDENT_AMBULATORY_CARE_PROVIDER_SITE_OTHER): Payer: Medicare Other

## 2018-08-07 DIAGNOSIS — I480 Paroxysmal atrial fibrillation: Secondary | ICD-10-CM | POA: Diagnosis not present

## 2018-08-12 ENCOUNTER — Other Ambulatory Visit: Payer: Self-pay | Admitting: Internal Medicine

## 2018-09-04 ENCOUNTER — Other Ambulatory Visit: Payer: Self-pay | Admitting: *Deleted

## 2018-09-04 ENCOUNTER — Other Ambulatory Visit: Payer: Self-pay

## 2018-09-04 DIAGNOSIS — I480 Paroxysmal atrial fibrillation: Secondary | ICD-10-CM

## 2018-09-28 ENCOUNTER — Telehealth: Payer: Self-pay | Admitting: Cardiology

## 2018-09-28 NOTE — Telephone Encounter (Signed)
New Message ° ° ° °Left message to confirm appt and answer covid questions  °

## 2018-09-28 NOTE — Telephone Encounter (Signed)
Follow up         COVID-19 Pre-Screening Questions:   In the past 7 to 10 days have you had a cough,  shortness of breath, headache, congestion, fever (100 or greater) body aches, chills, sore throat, or sudden loss of taste or sense of smell? NO  Have you been around anyone with known Covid 19. NO  Have you been around anyone who is awaiting Covid 19 test results in the past 7 to 10 days? NO  Have you been around anyone who has been exposed to Covid 19, or has mentioned symptoms of Covid 19 within the past 7 to 10 days? NO Pts wife says she needs to be with him for assistance and she answered NO to all screening questions  If you have any concerns/questions about symptoms patients report during screening (either on the phone or at threshold). Contact the provider seeing the patient or DOD for further guidance.  If neither are available contact a member of the leadership team.

## 2018-10-01 ENCOUNTER — Encounter: Payer: Self-pay | Admitting: Cardiology

## 2018-10-01 ENCOUNTER — Other Ambulatory Visit: Payer: Self-pay

## 2018-10-01 ENCOUNTER — Ambulatory Visit (INDEPENDENT_AMBULATORY_CARE_PROVIDER_SITE_OTHER): Payer: Medicare Other | Admitting: Cardiology

## 2018-10-01 VITALS — BP 170/90 | HR 58 | Ht 69.0 in | Wt 180.0 lb

## 2018-10-01 DIAGNOSIS — Z79899 Other long term (current) drug therapy: Secondary | ICD-10-CM

## 2018-10-01 DIAGNOSIS — I2510 Atherosclerotic heart disease of native coronary artery without angina pectoris: Secondary | ICD-10-CM | POA: Diagnosis not present

## 2018-10-01 DIAGNOSIS — I10 Essential (primary) hypertension: Secondary | ICD-10-CM

## 2018-10-01 DIAGNOSIS — I5022 Chronic systolic (congestive) heart failure: Secondary | ICD-10-CM | POA: Diagnosis not present

## 2018-10-01 DIAGNOSIS — I255 Ischemic cardiomyopathy: Secondary | ICD-10-CM | POA: Diagnosis not present

## 2018-10-01 LAB — BASIC METABOLIC PANEL
BUN/Creatinine Ratio: 16 (ref 10–24)
BUN: 20 mg/dL (ref 8–27)
CO2: 23 mmol/L (ref 20–29)
Calcium: 9.1 mg/dL (ref 8.6–10.2)
Chloride: 97 mmol/L (ref 96–106)
Creatinine, Ser: 1.25 mg/dL (ref 0.76–1.27)
GFR calc Af Amer: 64 mL/min/{1.73_m2} (ref >59)
GFR calc non Af Amer: 56 mL/min/{1.73_m2} — ABNORMAL LOW (ref >59)
Glucose: 112 mg/dL — ABNORMAL HIGH (ref 65–99)
Potassium: 4.4 mmol/L (ref 3.5–5.2)
Sodium: 137 mmol/L (ref 134–144)

## 2018-10-01 NOTE — Progress Notes (Signed)
Cardiology Office Note:    Date:  10/01/2018   ID:  Jerry Gilbert, DOB 08/18/1940, MRN [id number]  PCP:  Wyatt Mage, MD  Cardiologist:  Candee Furbish, MD  Electrophysiologist:  None   Referring MD: Wyatt Mage, MD   No chief complaint on file. Monitor follow-up  History of Present Illness:    Jerry Gilbert is a 78 y.o. male with coronary artery disease post CABG in December 2018 with ischemic cardiomyopathy EF 40% in 2017, AAA repair paroxysmal atrial fibrillation, hypertension hyperlipidemia COPD.  Last time Dr. Saunders Revel saw, 07/30/2018, was having some episodic lightheadedness in the shower.  He was concerned about paroxysmal atrial fibrillation.  A 14-day event monitor was performed.  Amiodarone and carvedilol was continued.  No anticoagulation was utilized because of history of GI bleed while on warfarin.  Off lisinopril 2.3 creat. Off 1.8. Cough better off lisinopril.  Still producing mucus.  His wife used to work in the MICU at [employer].  Retired in 2006.  Amlodipine dropped his BP quickly.  Past Medical History:  Diagnosis Date  . AAA (abdominal aortic aneurysm) ([place]) 11/04/09   3.6 cmx 3.4 cm  . Anemia   . Atrial tachycardia ([place])   . CAD (coronary artery disease)    S/P PCI LAD and subsequent CABG  . Chaotic atrial rhythm   . Dysrhythmia   . H/O transfusion of packed red blood cells    16 units of blood in 2014 d/t GI bleed  . H/O: GI bleed   . Heart murmur    in 1961  . Hypertension   . Ischemic cardiomyopathy   . Kidney stones   . Myocardial infarction ([place])   . Nocturia   . Personal history of unspecified circulatory disease    Transient Ischemic attacks  . Prostate cancer ([place])   . Shortness of breath dyspnea    'with minimal exertion'  . Stroke [facility])    "small TIA"  . Tobacco abuse     Past Surgical History:  Procedure Laterality Date  . ABDOMINAL AORTIC ANEURYSM REPAIR N/A 12/15/2014   Procedure: ABDOMINAL AORTIC ANEURYSM REPAIR;  Surgeon: Elam Dutch, MD;  Location: [place];  Service: Vascular;  Laterality: N/A;  . Bare-metal stent     Left circumflex about 10 years ago  . CARDIAC CATHETERIZATION     11/05/2008  . CORONARY ARTERY BYPASS GRAFT  03/10/2017   LIMA-LAD, SVG-D, SVG-OM, SVG-PDA  . FEMORAL-POPLITEAL BYPASS GRAFT Left 05/20/2015   Procedure: LEFT COMMON FEMORAL ARTERY ANEURYSM REPAIR;  Surgeon: Elam Dutch, MD;  Location: [place];  Service: Vascular;  Laterality: Left;  . PERCUTANEOUS CORONARY STENT INTERVENTION (PCI-S)     LAD  . PROSTATE SURGERY    . TONSILLECTOMY      Current Medications: Current Meds  Medication Sig  . amiodarone (PACERONE) 200 MG tablet Take 1 tablet by mouth once daily  . aspirin EC 81 MG tablet Take 1 tablet (81 mg total) by mouth daily.  . carvedilol (COREG) 6.25 MG tablet Take 1 tablet by mouth twice daily  . cloNIDine (CATAPRES) 0.2 MG tablet Take 0.2 mg by mouth 2 (two) times daily.  . Multiple Vitamins-Minerals (MULTIVITAMIN WITH MINERALS) tablet Take 1 tablet by mouth daily.  . nitroGLYCERIN (NITROSTAT) 0.4 MG SL tablet Place 1 tablet (0.4 mg total) under the tongue every 5 (five) minutes as needed for chest pain.  . pantoprazole (PROTONIX) 40 MG tablet TAKE 1 TABLET BY MOUTH TWICE DAILY  .  rosuvastatin (CRESTOR) 10 MG tablet Take 1 tablet (10 mg total) by mouth daily.     Allergies:   Celecoxib, Diclofenac, Statins, Warfarin and related, Lisinopril, and Morphine and related   Social History   Socioeconomic History  . Marital status: Married    Spouse name: Not on file  . Number of children: Not on file  . Years of education: Not on file  . Highest education level: Not on file  Occupational History  . Occupation: Retired  Scientific laboratory technician  . Financial resource strain: Not on file  . Food insecurity    Worry: Not on file    Inability: Not on file  . Transportation needs    Medical: Not on file    Non-medical: Not on file  Tobacco Use  . Smoking status: Former Smoker     Packs/day: 1.00    Years: 80.00    Pack years: 80.00    Types: Cigarettes    Quit date: 03/06/2017    Years since quitting: 1.5  . Smokeless tobacco: Never Used  Substance and Sexual Activity  . Alcohol use: No    Alcohol/week: 0.0 standard drinks    Comment: " No alcohol in over a year."  . Drug use: No    Comment: No illicit drug use and only takes multivitamin as far as herbal medication goes.  . Sexual activity: Not on file  Lifestyle  . Physical activity    Days per week: Not on file    Minutes per session: Not on file  . Stress: Not on file  Relationships  . Social Herbalist on phone: Not on file    Gets together: Not on file    Attends religious service: Not on file    Active member of club or organization: Not on file    Attends meetings of clubs or organizations: Not on file    Relationship status: Not on file  Other Topics Concern  . Not on file  Social History Narrative   Lives in [place], [place] with his wife   Remains active, working around his house   Diet is regular      Family History: The patient's family history includes Arrhythmia in his brother; Coronary artery disease in his brother; Diabetes in his brother; Heart attack (age of onset: 16) in his mother; Heart disease in his brother, father, and mother; Hyperlipidemia in his brother; Hypertension in his brother.  ROS:   Please see the history of present illness.    No fevers chills nausea vomiting syncope all other systems reviewed and are negative.  EKGs/Labs/Other Studies Reviewed:    The following studies were reviewed today:  Long-term monitor 09/04/2018-  The patient was monitored for 14 days.  The predominant rhythm was sinus with an average rate of 57 bpm (range 41 to 116 bpm).  Rare PACs and occasional PVCs were observed.  There were 2 episodes of paroxysmal supraventricular tachycardia lasting up to 17.5 seconds with a maximal rate of 112 bpm.  Some episodes of  bradycardia do not have clear P waves and may reflect a junctional rhythm (possibly with underlying atrial fibrillation).  Patient triggered events correspond to sinus rhythm with PVCs.   Predominantly sinus rhythm with occasional PVCs and 2 runs of PSVT lasting up to 17.5 seconds.  Episodes junctional rhythm with underlying atrial fibrillation cannot be excluded.  Dr. Rayann Heman reviewed.  EKG:  EKG is not ordered today.   Recent Labs: 12/08/2017: BUN 17; Creatinine,  Ser 1.22; Potassium 4.0; Sodium 137 02/12/2018: ALT 40; TSH 5.250  Recent Lipid Panel    Component Value Date/Time   CHOL 192 02/12/2018 0946   TRIG 178 (H) 02/12/2018 0946   HDL 45 02/12/2018 0946   CHOLHDL 4.3 02/12/2018 0946   CHOLHDL 6 09/01/2014 1100   VLDL 15.8 09/01/2014 1100   LDLCALC 111 (H) 02/12/2018 0946   LDLDIRECT 106 (H) 12/08/2017 1244   LDLDIRECT 174.2 12/19/2011 0908    Physical Exam:    VS:  BP (!) 170/90   Pulse (!) 58   Ht 5\' 9"  (1.753 m)   Wt 180 lb (81.6 kg)   SpO2 96%   BMI 26.58 kg/m     Wt Readings from Last 3 Encounters:  10/01/18 180 lb (81.6 kg)  07/30/18 175 lb (79.4 kg)  02/12/18 172 lb 6.4 oz (78.2 kg)     GEN:  Well nourished, well developed in no acute distress HEENT: Normal NECK: No JVD; No carotid bruits LYMPHATICS: No lymphadenopathy CARDIAC: RRR, no murmurs, rubs, gallops RESPIRATORY:  Clear to auscultation without rales, mild wheezing no rhonchi  ABDOMEN: Soft, non-tender, non-distended MUSCULOSKELETAL:  No edema; No deformity  SKIN: Warm and dry NEUROLOGIC:  Alert and oriented x 3 PSYCHIATRIC:  Normal affect   ASSESSMENT:    No diagnosis found. PLAN:    In order of problems listed above:  Paroxysmal atrial fibrillation -Monitor showed very brief episodes lasting only a few seconds duration of possible atrial fibrillation or junctional rhythm.  Overall fairly reassuring.  These likely correlate with his palpitations.  No major changes need to be made.   Chronic systolic heart failure due to ischemic cardiomyopathy - Stable weights.  Off lisinopril.  Because of AKI.  Off spironolactone as well.  They are not too enthusiastic about trying again.  COPD - Mild wheeze heard on exam.  Lisinopril being off of this, has improved this.   Medication Adjustments/Labs and Tests Ordered: Current medicines are reviewed at length with the patient today.  Concerns regarding medicines are outlined above.  No orders of the defined types were placed in this encounter.  No orders of the defined types were placed in this encounter.   There are no Patient Instructions on file for this visit.   Signed, Candee Furbish, MD  10/01/2018 11:42 AM    [facility]

## 2018-10-01 NOTE — Patient Instructions (Signed)
Medication Instructions:  The current medical regimen is effective;  continue present plan and medications.  If you need a refill on your cardiac medications before your next appointment, please call your pharmacy.   Lab work: Please have blood work today (BMP) If you have labs (blood work) drawn today and your tests are completely normal, you will receive your results only by: [name] MyChart Message (if you have MyChart) OR . A paper copy in the mail If you have any lab test that is abnormal or we need to change your treatment, we will call you to review the results.  Follow-Up: At [facility], you and your health needs are our priority.  As part of our continuing mission to provide you with exceptional heart care, we have created designated Provider Care Teams.  These Care Teams include your primary Cardiologist (physician) and Advanced Practice Providers (APPs -  Physician Assistants and Nurse Practitioners) who all work together to provide you with the care you need, when you need it. You will need a follow up appointment in 6 months with Jerry Kicks, NP and Jerry Gilbert in 12 months.  Please call our office 2 months in advance to schedule this appointment.  You may see Jerry Furbish, MD or one of the following Advanced Practice Providers on your designated Care Team:   Jerry Merle, NP Jerry Kicks, NP . Jerry Drown, NP  Thank you for choosing [facility]!!

## 2018-11-01 ENCOUNTER — Telehealth: Payer: Self-pay | Admitting: Physician Assistant

## 2018-11-01 NOTE — Telephone Encounter (Signed)
Patient's wife called stating her husband took 2 amiodarone pills this morning. I confirmed these were 200 mg tablets. His HR is in the 50s, which is his baseline. He is asymptomatic. I advised to avoid strenuous activity today and to monitor BP this morning. She expressed understanding of the plan.   Ledora Bottcher, PA-C 11/01/2018, 8:12 AM [place] [address] [place],  [address]

## 2019-02-04 ENCOUNTER — Other Ambulatory Visit: Payer: Self-pay | Admitting: Internal Medicine

## 2019-02-04 ENCOUNTER — Other Ambulatory Visit: Payer: Self-pay

## 2019-02-05 MED ORDER — AMIODARONE HCL 200 MG PO TABS: 200.0000 mg | ORAL_TABLET | Freq: Every day | ORAL | 2 refills | Status: DC

## 2019-02-18 ENCOUNTER — Telehealth (INDEPENDENT_AMBULATORY_CARE_PROVIDER_SITE_OTHER): Payer: Medicare Other | Admitting: Internal Medicine

## 2019-02-18 ENCOUNTER — Telehealth: Payer: Self-pay

## 2019-02-18 VITALS — BP 160/90 | HR 60 | Ht 69.0 in | Wt 178.0 lb

## 2019-02-18 DIAGNOSIS — I10 Essential (primary) hypertension: Secondary | ICD-10-CM

## 2019-02-18 DIAGNOSIS — I519 Heart disease, unspecified: Secondary | ICD-10-CM | POA: Diagnosis not present

## 2019-02-18 DIAGNOSIS — Z79899 Other long term (current) drug therapy: Secondary | ICD-10-CM

## 2019-02-18 DIAGNOSIS — I480 Paroxysmal atrial fibrillation: Secondary | ICD-10-CM

## 2019-02-18 DIAGNOSIS — I255 Ischemic cardiomyopathy: Secondary | ICD-10-CM

## 2019-02-18 NOTE — Telephone Encounter (Signed)
-----   Message from Thompson Grayer, MD sent at 02/18/2019  4:04 PM EST ----- Needs TSH, T4, LFTs, BMET, and Chest Xray He lives near [place].  He thinks his PCP may be able to obtain.  Please call patient to coordinate these labs.  Return to see me in a year

## 2019-02-18 NOTE — Telephone Encounter (Addendum)
Spoke with the pts PCP Dr. Lurlean Nanny in [place] and they do not do labs/ CXR for outside practices.. they recommended he has them at [facility].   Lab... spoke with Raquel Sarna and order to be faxed to [phone] and pt aware to call ahead of time to be sure they have the orders and then  he can just walk in.... their number given to the pt [phone] and ask for the lab.    Radiology.... spoke with Jackelyn Poling and order for CXR to be faxed to [phone]. Pt can walk in when he has his labs drawn.   Will send to Triage/ Sonia Baller to fax orders.

## 2019-02-18 NOTE — Progress Notes (Signed)
Electrophysiology TeleHealth Note   Due to national recommendations of social distancing due to COVID 19, an audio/video telehealth visit is felt to be most appropriate for this patient at this time.  See MyChart message from today for the patient's consent to telehealth for [employer]    Date:  02/18/2019   ID:  Jerry Gilbert, DOB 1940-06-24, MRN [id number]  Location: patient's home  Provider location:  [facility]  Evaluation Performed: Follow-up visit  PCP:  Wyatt Mage, MD   Electrophysiologist:  Dr Rayann Heman  Chief Complaint:  palpitations  History of Present Illness:    Jerry Gilbert is a 78 y.o. male who presents via telehealth conferencing today.  Since last being seen in our clinic, the patient reports doing very well.  Today, he denies symptoms of palpitations, chest pain, shortness of breath,  lower extremity edema, dizziness, presyncope, or syncope.  The patient is otherwise without complaint today.  The patient denies symptoms of fevers, chills, cough, or new SOB worrisome for COVID 19.  Past Medical History:  Diagnosis Date  . AAA (abdominal aortic aneurysm) ([place]) 11/04/09   3.6 cmx 3.4 cm  . Anemia   . Atrial tachycardia ([place])   . CAD (coronary artery disease)    S/P PCI LAD and subsequent CABG  . Chaotic atrial rhythm   . Dysrhythmia   . H/O transfusion of packed red blood cells    16 units of blood in 2014 d/t GI bleed  . H/O: GI bleed   . Heart murmur    in 1961  . Hypertension   . Ischemic cardiomyopathy   . Kidney stones   . Myocardial infarction ([place])   . Nocturia   . Personal history of unspecified circulatory disease    Transient Ischemic attacks  . Prostate cancer ([place])   . Shortness of breath dyspnea    'with minimal exertion'  . Stroke [facility])    "small TIA"  . Tobacco abuse     Past Surgical History:  Procedure Laterality Date  . ABDOMINAL AORTIC ANEURYSM REPAIR N/A 12/15/2014   Procedure: ABDOMINAL AORTIC ANEURYSM REPAIR;   Surgeon: Elam Dutch, MD;  Location: [facility];  Service: Vascular;  Laterality: N/A;  . Bare-metal stent     Left circumflex about 10 years ago  . CARDIAC CATHETERIZATION     11/05/2008  . CORONARY ARTERY BYPASS GRAFT  03/10/2017   LIMA-LAD, SVG-D, SVG-OM, SVG-PDA  . FEMORAL-POPLITEAL BYPASS GRAFT Left 05/20/2015   Procedure: LEFT COMMON FEMORAL ARTERY ANEURYSM REPAIR;  Surgeon: Elam Dutch, MD;  Location: [facility];  Service: Vascular;  Laterality: Left;  . PERCUTANEOUS CORONARY STENT INTERVENTION (PCI-S)     LAD  . PROSTATE SURGERY    . TONSILLECTOMY      Current Outpatient Medications  Medication Sig Dispense Refill  . amiodarone (PACERONE) 200 MG tablet Take 1 tablet (200 mg total) by mouth daily. 90 tablet 2  . aspirin EC 81 MG tablet Take 1 tablet (81 mg total) by mouth daily. 90 tablet 3  . carvedilol (COREG) 6.25 MG tablet Take 1 tablet by mouth twice daily 180 tablet 2  . cloNIDine (CATAPRES) 0.2 MG tablet Take 0.2 mg by mouth 2 (two) times daily.    . Multiple Vitamins-Minerals (MULTIVITAMIN WITH MINERALS) tablet Take 1 tablet by mouth daily.    . nitroGLYCERIN (NITROSTAT) 0.4 MG SL tablet Place 1 tablet (0.4 mg total) under the tongue every 5 (five) minutes as needed for  chest pain. 25 tablet 6  . pantoprazole (PROTONIX) 40 MG tablet TAKE 1 TABLET BY MOUTH TWICE DAILY 180 tablet 3  . rosuvastatin (CRESTOR) 10 MG tablet Take 1 tablet (10 mg total) by mouth daily. 90 tablet 3   No current facility-administered medications for this visit.     Allergies:   Celecoxib, Diclofenac, Statins, Warfarin and related, Lisinopril, and Morphine and related   Social History:  The patient  reports that he quit smoking about 1 years ago. His smoking use included cigarettes. He has a 80.00 pack-year smoking history. He has never used smokeless tobacco. He reports that he does not drink alcohol or use drugs.   Family History:  The patient's family history includes Arrhythmia in his brother;  Coronary artery disease in his brother; Diabetes in his brother; Heart attack (age of onset: 49) in his mother; Heart disease in his brother, father, and mother; Hyperlipidemia in his brother; Hypertension in his brother.   ROS:  Please see the history of present illness.   All other systems are personally reviewed and negative.    Exam:    Vital Signs:  BP (!) 160/90   Pulse 60   Ht 5\' 9"  (1.753 m)   Wt 178 lb (80.7 kg)   BMI 26.29 kg/m   Well sounding and appearing, alert and conversant, regular work of breathing,  good skin color Eyes- anicteric, neuro- grossly intact, skin- no apparent rash or lesions or cyanosis, mouth- oral mucosa is pink  Labs/Other Tests and Data Reviewed:    Recent Labs: 10/01/2018: BUN 20; Creatinine, Ser 1.25; Potassium 4.4; Sodium 137   Wt Readings from Last 3 Encounters:  02/18/19 178 lb (80.7 kg)  10/01/18 180 lb (81.6 kg)  07/30/18 175 lb (79.4 kg)     ASSESSMENT & PLAN:    1.  Persistent atrial fibrillation Doing well with amiodarone Not on anticoagulation due to prior GI bleeding Labs will need to be obtained locally (TFTS, LFTs, CXR)  2. HTN Elevated today Not on ACEi or spirolactone due to renal failure Bradycardia limits uptitration of coreg I have advised adding low dose norvasc.  His wife is reluctant to consider this today. Needs BMET with PCP  3. CAD/ischemicCM Followed by Dr Marlou Porch following CABG EF 40%  4. Tobacco He is smoking a ppd but is trying to quit  Follow-up:  Follow-up with Dr Marlou Porch as scheduled Return to see me in a year   Patient Risk:  after full review of this patients clinical status, I feel that they are at moderate risk at this time.  Today, I have spent 15 minutes with the patient with telehealth technology discussing arrhythmia management .    SignedThompson Grayer, MD  02/18/2019 3:59 PM     [place] [address] [phone] (office)  [phone] (fax)

## 2019-02-19 ENCOUNTER — Other Ambulatory Visit: Payer: Self-pay

## 2019-02-19 DIAGNOSIS — I480 Paroxysmal atrial fibrillation: Secondary | ICD-10-CM

## 2019-02-19 DIAGNOSIS — Z79899 Other long term (current) drug therapy: Secondary | ICD-10-CM

## 2019-02-19 NOTE — Telephone Encounter (Signed)
Orders faxed as noted

## 2019-02-19 NOTE — Addendum Note (Signed)
Addended by: Willeen Cass A on: 02/19/2019 09:05 AM   Modules accepted: Orders

## 2019-02-25 DIAGNOSIS — I25118 Atherosclerotic heart disease of native coronary artery with other forms of angina pectoris: Secondary | ICD-10-CM

## 2019-02-25 DIAGNOSIS — Z79899 Other long term (current) drug therapy: Secondary | ICD-10-CM

## 2019-02-25 DIAGNOSIS — I2510 Atherosclerotic heart disease of native coronary artery without angina pectoris: Secondary | ICD-10-CM

## 2019-02-25 DIAGNOSIS — E785 Hyperlipidemia, unspecified: Secondary | ICD-10-CM

## 2019-03-13 ENCOUNTER — Other Ambulatory Visit: Payer: Self-pay | Admitting: Internal Medicine

## 2019-03-13 DIAGNOSIS — Z79899 Other long term (current) drug therapy: Secondary | ICD-10-CM

## 2019-03-13 DIAGNOSIS — E785 Hyperlipidemia, unspecified: Secondary | ICD-10-CM

## 2019-03-13 DIAGNOSIS — I2510 Atherosclerotic heart disease of native coronary artery without angina pectoris: Secondary | ICD-10-CM

## 2019-03-13 DIAGNOSIS — I25118 Atherosclerotic heart disease of native coronary artery with other forms of angina pectoris: Secondary | ICD-10-CM

## 2019-03-13 MED ORDER — ROSUVASTATIN CALCIUM 10 MG PO TABS: 10.0000 mg | ORAL_TABLET | Freq: Every day | ORAL | 3 refills | Status: DC

## 2019-04-10 ENCOUNTER — Other Ambulatory Visit: Payer: Self-pay | Admitting: Internal Medicine

## 2019-04-10 MED ORDER — PANTOPRAZOLE SODIUM 40 MG PO TBEC: 40.0000 mg | DELAYED_RELEASE_TABLET | Freq: Two times a day (BID) | ORAL | 3 refills | Status: DC

## 2019-04-10 MED ORDER — CARVEDILOL 6.25 MG PO TABS: 6.2500 mg | ORAL_TABLET | Freq: Two times a day (BID) | ORAL | 3 refills | Status: DC

## 2019-04-13 ENCOUNTER — Other Ambulatory Visit: Payer: Self-pay | Admitting: Internal Medicine

## 2019-05-30 ENCOUNTER — Ambulatory Visit (INDEPENDENT_AMBULATORY_CARE_PROVIDER_SITE_OTHER): Payer: Medicare Other | Admitting: Cardiology

## 2019-05-30 ENCOUNTER — Other Ambulatory Visit: Payer: Self-pay

## 2019-05-30 ENCOUNTER — Encounter: Payer: Self-pay | Admitting: Cardiology

## 2019-05-30 VITALS — BP 160/90 | HR 62 | Ht 69.0 in | Wt 190.0 lb

## 2019-05-30 DIAGNOSIS — I255 Ischemic cardiomyopathy: Secondary | ICD-10-CM | POA: Diagnosis not present

## 2019-05-30 DIAGNOSIS — I10 Essential (primary) hypertension: Secondary | ICD-10-CM

## 2019-05-30 DIAGNOSIS — Z79899 Other long term (current) drug therapy: Secondary | ICD-10-CM

## 2019-05-30 DIAGNOSIS — I480 Paroxysmal atrial fibrillation: Secondary | ICD-10-CM

## 2019-05-30 DIAGNOSIS — I2510 Atherosclerotic heart disease of native coronary artery without angina pectoris: Secondary | ICD-10-CM | POA: Diagnosis not present

## 2019-05-30 DIAGNOSIS — I5022 Chronic systolic (congestive) heart failure: Secondary | ICD-10-CM | POA: Diagnosis not present

## 2019-05-30 MED ORDER — SPIRONOLACTONE 25 MG PO TABS: 25.0000 mg | ORAL_TABLET | Freq: Every day | ORAL | 3 refills | Status: DC

## 2019-05-30 NOTE — Patient Instructions (Signed)
Medication Instructions:  Please start Spironolactone 25 mg a day. Continue all other medications as listed.  *If you need a refill on your cardiac medications before your next appointment, please call your pharmacy*  Lab Work: Please have blood work today (CMP,TSH) and in 1 week (BMP)  If you have labs (blood work) drawn today and your tests are completely normal, you will receive your results only by: [name] MyChart Message (if you have MyChart) OR . A paper copy in the mail If you have any lab test that is abnormal or we need to change your treatment, we will call you to review the results.  Follow-Up: At [facility], you and your health needs are our priority.  As part of our continuing mission to provide you with exceptional heart care, we have created designated Provider Care Teams.  These Care Teams include your primary Cardiologist (physician) and Advanced Practice Providers (APPs -  Physician Assistants and Nurse Practitioners) who all work together to provide you with the care you need, when you need it.  We recommend signing up for the patient portal called "MyChart".  Sign up information is provided on this After Visit Summary.  MyChart is used to connect with patients for Virtual Visits (Telemedicine).  Patients are able to view lab/test results, encounter notes, upcoming appointments, etc.  Non-urgent messages can be sent to your provider as well.   To learn more about what you can do with MyChart, go to [email or web address].    Your next appointment:   2 week(s)  The format for your next appointment:   Virtual Visit   Provider:   You may see Candee Furbish, MD or one of the following Advanced Practice Providers on your designated Care Team:    Truitt Merle, NP  Cecilie Kicks, NP  Kathyrn Drown, NP    Thank you for choosing [facility]!!     Spironolactone Oral Tablets What is this medicine? SPIRONOLACTONE (speer on [place] LAK tone) is a diuretic. It  helps you make more urine and to lose excess water from your body. This medicine is used to treat high blood pressure, and edema or swelling from heart, kidney, or liver disease. It is also used to treat patients who make too much aldosterone or have low potassium. This medicine may be used for other purposes; ask your health care provider or pharmacist if you have questions. COMMON BRAND NAME(S): Aldactone What should I tell my health care provider before I take this medicine? They need to know if you have any of these conditions:  high blood level of potassium  kidney disease or trouble making urine  liver disease  an unusual or allergic reaction to spironolactone, other medicines, foods, dyes, or preservatives  pregnant or trying to get pregnant  breast-feeding How should I use this medicine? Take this drug by mouth. Take it as directed on the prescription label at the same time every day. You can take it with or without food. You should always take it the same way. Keep taking it unless your health care provider tells you to stop. Talk to your health care provider about the use of this drug in children. Special care may be needed. Overdosage: If you think you have taken too much of this medicine contact a poison control center or emergency room at once. NOTE: This medicine is only for you. Do not share this medicine with others. What if I miss a dose? If you miss a dose, take it as  soon as you can. If it is almost time for your next dose, take only that dose. Do not take double or extra doses. What may interact with this medicine? Do not take this medicine with any of the following medications:  cidofovir  eplerenone  tranylcypromine This medicine may also interact with the following medications:  aspirin  certain medicines for blood pressure or heart disease like benazepril, lisinopril, losartan, valsartan  certain medicines that treat or prevent blood clots like heparin  and enoxaparin  cholestyramine  cyclosporine  digoxin  lithium  medicines that relax muscles for surgery  NSAIDs, medicines for pain and inflammation, like ibuprofen or naproxen  other diuretics  potassium supplements  steroid medicines like prednisone or cortisone  trimethoprim This list may not describe all possible interactions. Give your health care provider a list of all the medicines, herbs, non-prescription drugs, or dietary supplements you use. Also tell them if you smoke, drink alcohol, or use illegal drugs. Some items may interact with your medicine. What should I watch for while using this medicine? Visit your doctor or health care professional for regular checks on your progress. Check your blood pressure as directed. Ask your doctor what your blood pressure should be, and when you should contact them. You may need to be on a special diet while taking this medicine. Ask your doctor. Also, ask how many glasses of fluid you need to drink a day. You must not get dehydrated. This medicine may make you feel confused, dizzy or lightheaded. Drinking alcohol and taking some medicines can make this worse. Do not drive, use machinery, or do anything that needs mental alertness until you know how this medicine affects you. Do not sit or stand up quickly. What side effects may I notice from receiving this medicine? Side effects that you should report to your doctor or health care professional as soon as possible:  allergic reactions such as skin rash or itching, hives, swelling of the lips, mouth, tongue, or throat  black or tarry stools  fast, irregular heartbeat  fever  muscle pain, cramps  numbness, tingling in hands or feet  trouble breathing  trouble passing urine  unusual bleeding  unusually weak or tired Side effects that usually do not require medical attention (report to your doctor or health care professional if they continue or are bothersome):  change in  voice or hair growth  confusion  dizzy, drowsy  dry mouth, increased thirst  enlarged or tender breasts  headache  irregular menstrual periods  sexual difficulty, unable to have an erection  stomach upset This list may not describe all possible side effects. Call your doctor for medical advice about side effects. You may report side effects to FDA at 1-800-FDA-1088. Where should I keep my medicine? Keep out of the reach of children and pets. Store at room temperature between 20 and 25 degrees C (68 and 77 degrees F). Throw away any unused drug after the expiration date. NOTE: This sheet is a summary. It may not cover all possible information. If you have questions about this medicine, talk to your doctor, pharmacist, or health care provider.  2020 [employer]vier/Gold Standard (2018-10-23 12:38:34)

## 2019-05-30 NOTE — Progress Notes (Signed)
Cardiology Office Note:    Date:  05/30/2019   ID:  Jerry Gilbert, DOB June 22, 1940, MRN [id number]  PCP:  Jerry Mage, MD  Cardiologist:  Jerry Furbish, MD  Electrophysiologist:  None   Referring MD: Jerry Mage, MD    History of Present Illness:    Jerry Gilbert is a 79 y.o. male here for the follow-up of difficult to control hypertension with coronary artery disease status post CABG in December 2018, ischemic cardiomyopathy EF 40%, AAA repair, paroxysmal atrial fibrillation, COPD.  He used to see Dr. Saunders Revel in the past.  No anticoagulation was utilized previously because of GI bleed.  Prior chronic kidney disease stage III with creatinines of 1.8-2.3.  His wife used to work at the MICU at [employer].  She retired in 2006.  Previously, amlodipine dropped his blood pressure pretty rapidly.  It has been several years.  His wife is very hesitant for [place] to start amlodipine again even at 2.5 mg.  If we did start it, she would like for him to sit in our office for about a half an hour to make sure everything is safe.  He also noticed some dj vu recently.  For instance he watched a new race on television and said he had already seen this.  I asked him to discuss this with Dr. Lurlean Gilbert.    Past Medical History:  Diagnosis Date  . AAA (abdominal aortic aneurysm) ([place]) 11/04/09   3.6 cmx 3.4 cm  . Anemia   . Atrial tachycardia ([place])   . CAD (coronary artery disease)    S/P PCI LAD and subsequent CABG  . Chaotic atrial rhythm   . Dysrhythmia   . H/O transfusion of packed red blood cells    16 units of blood in 2014 d/t GI bleed  . H/O: GI bleed   . Heart murmur    in 1961  . Hypertension   . Ischemic cardiomyopathy   . Kidney stones   . Myocardial infarction ([place])   . Nocturia   . Personal history of unspecified circulatory disease    Transient Ischemic attacks  . Prostate cancer ([place])   . Shortness of breath dyspnea    'with minimal exertion'  . Stroke [facility])    "small TIA"  .  Tobacco abuse     Past Surgical History:  Procedure Laterality Date  . ABDOMINAL AORTIC ANEURYSM REPAIR N/A 12/15/2014   Procedure: ABDOMINAL AORTIC ANEURYSM REPAIR;  Surgeon: Elam Dutch, MD;  Location: [facility];  Service: Vascular;  Laterality: N/A;  . Bare-metal stent     Left circumflex about 10 years ago  . CARDIAC CATHETERIZATION     11/05/2008  . CORONARY ARTERY BYPASS GRAFT  03/10/2017   LIMA-LAD, SVG-D, SVG-OM, SVG-PDA  . FEMORAL-POPLITEAL BYPASS GRAFT Left 05/20/2015   Procedure: LEFT COMMON FEMORAL ARTERY ANEURYSM REPAIR;  Surgeon: Elam Dutch, MD;  Location: [facility];  Service: Vascular;  Laterality: Left;  . PERCUTANEOUS CORONARY STENT INTERVENTION (PCI-S)     LAD  . PROSTATE SURGERY    . TONSILLECTOMY      Current Medications: Current Meds  Medication Sig  . amiodarone (PACERONE) 200 MG tablet Take 1 tablet (200 mg total) by mouth daily.  [name] aspirin EC 81 MG tablet Take 1 tablet (81 mg total) by mouth daily.  . carvedilol (COREG) 6.25 MG tablet Take 1 tablet (6.25 mg total) by mouth 2 (two) times daily.  . cloNIDine (CATAPRES) 0.2 MG tablet Take  1 tablet by mouth twice daily  . Multiple Vitamins-Minerals (MULTIVITAMIN WITH MINERALS) tablet Take 1 tablet by mouth daily.  . nitroGLYCERIN (NITROSTAT) 0.4 MG SL tablet Place 1 tablet (0.4 mg total) under the tongue every 5 (five) minutes as needed for chest pain.  . pantoprazole (PROTONIX) 40 MG tablet Take 1 tablet (40 mg total) by mouth 2 (two) times daily.  . rosuvastatin (CRESTOR) 10 MG tablet Take 1 tablet (10 mg total) by mouth daily.     Allergies:   Celecoxib, Diclofenac, Statins, Warfarin and related, Lisinopril, and Morphine and related   Social History   Socioeconomic History  . Marital status: Married    Spouse name: Not on file  . Number of children: Not on file  . Years of education: Not on file  . Highest education level: Not on file  Occupational History  . Occupation: Retired  Tobacco Use  .  Smoking status: Former Smoker    Packs/day: 1.00    Years: 80.00    Pack years: 80.00    Types: Cigarettes    Quit date: 03/06/2017    Years since quitting: 2.2  . Smokeless tobacco: Never Used  Substance and Sexual Activity  . Alcohol use: No    Alcohol/week: 0.0 standard drinks    Comment: " No alcohol in over a year."  . Drug use: No    Comment: No illicit drug use and only takes multivitamin as far as herbal medication goes.  . Sexual activity: Not on file  Other Topics Concern  . Not on file  Social History Narrative   Lives in [place], [place] with his wife   Remains active, working around his house   Diet is regular    Social Determinants of Radio broadcast assistant Strain:   . Difficulty of Paying Living Expenses:   Food Insecurity:   . Worried About Charity fundraiser in the Last Year:   . Arboriculturist in the Last Year:   Transportation Needs:   . Film/video editor (Medical):   [name] Lack of Transportation (Non-Medical):   Physical Activity:   . Days of Exercise per Week:   . Minutes of Exercise per Session:   Stress:   . Feeling of Stress :   Social Connections:   . Frequency of Communication with Friends and Family:   . Frequency of Social Gatherings with Friends and Family:   . Attends Religious Services:   . Active Member of Clubs or Organizations:   . Attends Archivist Meetings:   [name] Marital Status:      Family History: The patient's family history includes Arrhythmia in his brother; Coronary artery disease in his brother; Diabetes in his brother; Heart attack (age of onset: 72) in his mother; Heart disease in his brother, father, and mother; Hyperlipidemia in his brother; Hypertension in his brother.  ROS:   Please see the history of present illness.     All other systems reviewed and are negative.  EKGs/Labs/Other Studies Reviewed:    The following studies were reviewed today:  Long-term monitor 09/04/2018-  The patient  was monitored for 14 days.  The predominant rhythm was sinus with an average rate of 57 bpm (range 41 to 116 bpm).  Rare PACs and occasional PVCs were observed.  There were 2 episodes of paroxysmal supraventricular tachycardia lasting up to 17.5 seconds with a maximal rate of 112 bpm.  Some episodes of bradycardia do not have clear P waves  and may reflect a junctional rhythm (possibly with underlying atrial fibrillation).  Patient triggered events correspond to sinus rhythm with PVCs.  Predominantly sinus rhythm with occasional PVCs and 2 runs of PSVT lasting up to 17.5 seconds. Episodes junctional rhythm with underlying atrial fibrillation cannot be excluded.  Dr. Rayann Heman reviewed.  EKG:  EKG is  ordered today.  The ekg ordered today demonstrates sinus 62. LAFB  Recent Labs: 10/01/2018: BUN 20; Creatinine, Ser 1.25; Potassium 4.4; Sodium 137  Recent Lipid Panel    Component Value Date/Time   CHOL 192 02/12/2018 0946   TRIG 178 (H) 02/12/2018 0946   HDL 45 02/12/2018 0946   CHOLHDL 4.3 02/12/2018 0946   CHOLHDL 6 09/01/2014 1100   VLDL 15.8 09/01/2014 1100   LDLCALC 111 (H) 02/12/2018 0946   LDLDIRECT 106 (H) 12/08/2017 1244   LDLDIRECT 174.2 12/19/2011 0908    Physical Exam:    VS:  BP (!) 160/90   Pulse 62   Ht 5\' 9"  (1.753 m)   Wt 190 lb (86.2 kg)   SpO2 96%   BMI 28.06 kg/m     Wt Readings from Last 3 Encounters:  05/30/19 190 lb (86.2 kg)  02/18/19 178 lb (80.7 kg)  10/01/18 180 lb (81.6 kg)     GEN:  Well nourished, well developed in no acute distress HEENT: Normal NECK: No JVD; No carotid bruits LYMPHATICS: No lymphadenopathy CARDIAC: RRR, no murmurs, rubs, gallops RESPIRATORY:  Clear to auscultation without rales, wheezing or rhonchi  ABDOMEN: Soft, non-tender, non-distended MUSCULOSKELETAL:  No edema; No deformity  SKIN: Warm and dry NEUROLOGIC:  Alert  PSYCHIATRIC:  Normal affect   ASSESSMENT:    1. Coronary artery disease involving native  coronary artery of native heart without angina pectoris   2. Chronic systolic heart failure (HCC)   3. Paroxysmal atrial fibrillation ([place])   4. Ischemic cardiomyopathy   5. Essential hypertension   6. Medication management    PLAN:    In order of problems listed above:  Difficult to control hypertension -HR 50-60.  -He has been off of the lisinopril and spironolactone. -I will go ahead and resume the spironolactone 25 mg once a day.  We will check a basic metabolic profile or complete metabolic profile today.  I would like him to get another basic metabolic profile in 1 week.  They do live in [place].  Previously we did have some trouble coordinating labs at outside facility.  I would like for him to get his lab work done here. -Next step could be to add a very low-dose of amlodipine, 2.5 mg once a day.  His wife was concerned about this.  Blood pressure dropped significantly previously.  If we did use this medication, they would like to come to the office to take.  This is fine.  He could sit in the lobby for approximately 30 minutes to make sure it is safe to utilize.  COPD -Mild wheeze previously as well as today.  Continue to encourage tobacco cessation.  He is off of lisinopril, ACE inhibitor.  Chronic systolic heart failure secondary to ischemic cardiomyopathy-prior EF 45%. -He is off of spironolactone, off of lisinopril.  We are going to resume spironolactone.  Check labs closely. -His wife did give him a dose of furosemide the other day when he was having some orthopnea.  This did seem to help.  Hopefully the addition of the spironolactone will assist with this maintenance wise.  Paroxysmal atrial fibrillation -His last monitor  showed brief episodes of possible atrial fibrillation or junctional rhythm.  These correlate with his palpitations.  No major changes.  Today it is sinus rhythm.   Medication Adjustments/Labs and Tests Ordered: Current medicines are reviewed at length with  the patient today.  Concerns regarding medicines are outlined above.  Orders Placed This Encounter  Procedures  . TSH  . Comprehensive metabolic panel  . Basic metabolic panel  . EKG 12-Lead   Meds ordered this encounter  Medications  . spironolactone (ALDACTONE) 25 MG tablet    Sig: Take 1 tablet (25 mg total) by mouth daily.    Dispense:  90 tablet    Refill:  3    Patient Instructions  Medication Instructions:  Please start Spironolactone 25 mg a day. Continue all other medications as listed.  *If you need a refill on your cardiac medications before your next appointment, please call your pharmacy*  Lab Work: Please have blood work today (CMP,TSH) and in 1 week (BMP)  If you have labs (blood work) drawn today and your tests are completely normal, you will receive your results only by: [name] MyChart Message (if you have MyChart) OR . A paper copy in the mail If you have any lab test that is abnormal or we need to change your treatment, we will call you to review the results.  Follow-Up: At [facility], you and your health needs are our priority.  As part of our continuing mission to provide you with exceptional heart care, we have created designated Provider Care Teams.  These Care Teams include your primary Cardiologist (physician) and Advanced Practice Providers (APPs -  Physician Assistants and Nurse Practitioners) who all work together to provide you with the care you need, when you need it.  We recommend signing up for the patient portal called "MyChart".  Sign up information is provided on this After Visit Summary.  MyChart is used to connect with patients for Virtual Visits (Telemedicine).  Patients are able to view lab/test results, encounter notes, upcoming appointments, etc.  Non-urgent messages can be sent to your provider as well.   To learn more about what you can do with MyChart, go to [email or web address].    Your next appointment:   2 week(s)  The format  for your next appointment:   Virtual Visit   Provider:   You may see Jerry Furbish, MD or one of the following Advanced Practice Providers on your designated Care Team:    Truitt Merle, NP  Cecilie Kicks, NP  Kathyrn Drown, NP    Thank you for choosing [facility]!!     Spironolactone Oral Tablets What is this medicine? SPIRONOLACTONE (speer on [place] LAK tone) is a diuretic. It helps you make more urine and to lose excess water from your body. This medicine is used to treat high blood pressure, and edema or swelling from heart, kidney, or liver disease. It is also used to treat patients who make too much aldosterone or have low potassium. This medicine may be used for other purposes; ask your health care provider or pharmacist if you have questions. COMMON BRAND NAME(S): Aldactone What should I tell my health care provider before I take this medicine? They need to know if you have any of these conditions:  high blood level of potassium  kidney disease or trouble making urine  liver disease  an unusual or allergic reaction to spironolactone, other medicines, foods, dyes, or preservatives  pregnant or trying to get pregnant  breast-feeding  How should I use this medicine? Take this drug by mouth. Take it as directed on the prescription label at the same time every day. You can take it with or without food. You should always take it the same way. Keep taking it unless your health care provider tells you to stop. Talk to your health care provider about the use of this drug in children. Special care may be needed. Overdosage: If you think you have taken too much of this medicine contact a poison control center or emergency room at once. NOTE: This medicine is only for you. Do not share this medicine with others. What if I miss a dose? If you miss a dose, take it as soon as you can. If it is almost time for your next dose, take only that dose. Do not take double or extra  doses. What may interact with this medicine? Do not take this medicine with any of the following medications:  cidofovir  eplerenone  tranylcypromine This medicine may also interact with the following medications:  aspirin  certain medicines for blood pressure or heart disease like benazepril, lisinopril, losartan, valsartan  certain medicines that treat or prevent blood clots like heparin and enoxaparin  cholestyramine  cyclosporine  digoxin  lithium  medicines that relax muscles for surgery  NSAIDs, medicines for pain and inflammation, like ibuprofen or naproxen  other diuretics  potassium supplements  steroid medicines like prednisone or cortisone  trimethoprim This list may not describe all possible interactions. Give your health care provider a list of all the medicines, herbs, non-prescription drugs, or dietary supplements you use. Also tell them if you smoke, drink alcohol, or use illegal drugs. Some items may interact with your medicine. What should I watch for while using this medicine? Visit your doctor or health care professional for regular checks on your progress. Check your blood pressure as directed. Ask your doctor what your blood pressure should be, and when you should contact them. You may need to be on a special diet while taking this medicine. Ask your doctor. Also, ask how many glasses of fluid you need to drink a day. You must not get dehydrated. This medicine may make you feel confused, dizzy or lightheaded. Drinking alcohol and taking some medicines can make this worse. Do not drive, use machinery, or do anything that needs mental alertness until you know how this medicine affects you. Do not sit or stand up quickly. What side effects may I notice from receiving this medicine? Side effects that you should report to your doctor or health care professional as soon as possible:  allergic reactions such as skin rash or itching, hives, swelling of the  lips, mouth, tongue, or throat  black or tarry stools  fast, irregular heartbeat  fever  muscle pain, cramps  numbness, tingling in hands or feet  trouble breathing  trouble passing urine  unusual bleeding  unusually weak or tired Side effects that usually do not require medical attention (report to your doctor or health care professional if they continue or are bothersome):  change in voice or hair growth  confusion  dizzy, drowsy  dry mouth, increased thirst  enlarged or tender breasts  headache  irregular menstrual periods  sexual difficulty, unable to have an erection  stomach upset This list may not describe all possible side effects. Call your doctor for medical advice about side effects. You may report side effects to FDA at 1-800-FDA-1088. Where should I keep my medicine? Keep out of  the reach of children and pets. Store at room temperature between 20 and 25 degrees C (68 and 77 degrees F). Throw away any unused drug after the expiration date. NOTE: This sheet is a summary. It may not cover all possible information. If you have questions about this medicine, talk to your doctor, pharmacist, or health care provider.  2020 Elsevier/Gold Standard (2018-10-23 12:38:34)     Signed, Jerry Furbish, MD  05/30/2019 3:43 PM    [facility]

## 2019-05-31 LAB — COMPREHENSIVE METABOLIC PANEL
ALT: 20 IU/L (ref 0–44)
AST: 20 IU/L (ref 0–40)
Albumin/Globulin Ratio: 1.5 (ref 1.2–2.2)
Albumin: 4 g/dL (ref 3.7–4.7)
Alkaline Phosphatase: 117 IU/L (ref 39–117)
BUN/Creatinine Ratio: 15 (ref 10–24)
BUN: 17 mg/dL (ref 8–27)
Bilirubin Total: 0.3 mg/dL (ref 0.0–1.2)
CO2: 26 mmol/L (ref 20–29)
Calcium: 9.3 mg/dL (ref 8.6–10.2)
Chloride: 99 mmol/L (ref 96–106)
Creatinine, Ser: 1.15 mg/dL (ref 0.76–1.27)
GFR calc Af Amer: 71 mL/min/{1.73_m2} (ref >59)
GFR calc non Af Amer: 61 mL/min/{1.73_m2} (ref >59)
Globulin, Total: 2.7 g/dL (ref 1.5–4.5)
Glucose: 109 mg/dL — ABNORMAL HIGH (ref 65–99)
Potassium: 4 mmol/L (ref 3.5–5.2)
Sodium: 139 mmol/L (ref 134–144)
Total Protein: 6.7 g/dL (ref 6.0–8.5)

## 2019-05-31 LAB — TSH: TSH: 2.23 u[IU]/mL (ref 0.450–4.500)

## 2019-06-03 ENCOUNTER — Telehealth: Payer: Self-pay | Admitting: *Deleted

## 2019-06-03 MED ORDER — CLONIDINE HCL 0.3 MG PO TABS: 0.3000 mg | ORAL_TABLET | Freq: Two times a day (BID) | ORAL | 6 refills | Status: DC

## 2019-06-03 MED ORDER — LOSARTAN POTASSIUM 25 MG PO TABS: 25.0000 mg | ORAL_TABLET | Freq: Every day | ORAL | 6 refills | Status: DC

## 2019-06-03 MED ORDER — FUROSEMIDE 20 MG PO TABS: 20.0000 mg | ORAL_TABLET | Freq: Every day | ORAL | 6 refills | Status: DC

## 2019-06-03 NOTE — Telephone Encounter (Signed)
Received 2 pt advise requests today regarding pt's elevated BP.  This included several blood pressures ranging from 210/100 to the most recent 150/90.  Reviewed information with Dr Marlou Porch who gave verbal orders to increase Clonidine to 0.3 mh BID, start Lasix 20 mg daily and Losartan 25 mg daily.  Called and reviewed all information with wife who states understanding.  They will make medication changes as ordered, continue other medications as listed and follow up in the office with Dr Marlou Porch on Friday.

## 2019-06-03 NOTE — Telephone Encounter (Signed)
I spoke with patient's wife regarding BP readings.  These are from yesterday and today. Last reading was at noon today. Prior readings were at midnight, 5 PM, 3 PM and 10 AM.  He takes medicines at 7 AM and 7 PM.  Wife reports patient is not very active and lays around a lot.  No other new changes today.  Other readings prior to these were 210/110,190/100, 140/65,170/100.  Yesterday morning he forgot to take AM medications.  Wife rechecked BP and is now 200/108.   Will forward to Dr Marlou Porch

## 2019-06-07 ENCOUNTER — Other Ambulatory Visit: Payer: Medicare Other | Admitting: *Deleted

## 2019-06-07 ENCOUNTER — Ambulatory Visit (INDEPENDENT_AMBULATORY_CARE_PROVIDER_SITE_OTHER): Payer: Medicare Other | Admitting: Cardiology

## 2019-06-07 ENCOUNTER — Encounter: Payer: Self-pay | Admitting: Cardiology

## 2019-06-07 ENCOUNTER — Other Ambulatory Visit: Payer: Self-pay

## 2019-06-07 VITALS — BP 122/80 | HR 78 | Ht 69.0 in | Wt 187.0 lb

## 2019-06-07 DIAGNOSIS — I10 Essential (primary) hypertension: Secondary | ICD-10-CM

## 2019-06-07 DIAGNOSIS — I255 Ischemic cardiomyopathy: Secondary | ICD-10-CM

## 2019-06-07 DIAGNOSIS — Z79899 Other long term (current) drug therapy: Secondary | ICD-10-CM

## 2019-06-07 LAB — BASIC METABOLIC PANEL
BUN/Creatinine Ratio: 18 (ref 10–24)
BUN: 27 mg/dL (ref 8–27)
CO2: 31 mmol/L — ABNORMAL HIGH (ref 20–29)
Calcium: 9.8 mg/dL (ref 8.6–10.2)
Chloride: 96 mmol/L (ref 96–106)
Creatinine, Ser: 1.54 mg/dL — ABNORMAL HIGH (ref 0.76–1.27)
GFR calc Af Amer: 50 mL/min/{1.73_m2} — ABNORMAL LOW (ref >59)
GFR calc non Af Amer: 43 mL/min/{1.73_m2} — ABNORMAL LOW (ref >59)
Glucose: 90 mg/dL (ref 65–99)
Potassium: 3.8 mmol/L (ref 3.5–5.2)
Sodium: 136 mmol/L (ref 134–144)

## 2019-06-07 NOTE — Progress Notes (Signed)
Cardiology Office Note:    Date:  06/07/2019   ID:  [name], DOB 05/15/1940, MRN [id number]  PCP:  Jerry Mage, MD  Cardiologist:  Jerry Furbish, MD  Electrophysiologist:  None   Referring MD: Jerry Mage, MD     History of Present Illness:    Jerry Gilbert is a 79 y.o. male here for urgent blood pressure follow-up.  He was 210/100 at 1 point at home.  We made the following changes, increased his clonidine to 0.3 twice a day, added losartan 25 mg a day and added Lasix 20 mg a day.  He did notice increased urine output.  His wife showed me blood pressure readings.  The first time he she increase the clonidine his blood pressure did drop pretty significantly for the first 2 readings and then came back up.  Now it has stabilized.  Blood pressures are between 120 and 140 currently at home.  No chest pain no shortness of breath.  Past Medical History:  Diagnosis Date  . AAA (abdominal aortic aneurysm) ([place]) 11/04/09   3.6 cmx 3.4 cm  . Anemia   . Atrial tachycardia ([place])   . CAD (coronary artery disease)    S/P PCI LAD and subsequent CABG  . Chaotic atrial rhythm   . Dysrhythmia   . H/O transfusion of packed red blood cells    16 units of blood in 2014 d/t GI bleed  . H/O: GI bleed   . Heart murmur    in 1961  . Hypertension   . Ischemic cardiomyopathy   . Kidney stones   . Myocardial infarction ([place])   . Nocturia   . Personal history of unspecified circulatory disease    Transient Ischemic attacks  . Prostate cancer ([place])   . Shortness of breath dyspnea    'with minimal exertion'  . Stroke [facility])    "small TIA"  . Tobacco abuse     Past Surgical History:  Procedure Laterality Date  . ABDOMINAL AORTIC ANEURYSM REPAIR N/A 12/15/2014   Procedure: ABDOMINAL AORTIC ANEURYSM REPAIR;  Surgeon: Jerry Dutch, MD;  Location: [place];  Service: Vascular;  Laterality: N/A;  . Bare-metal stent     Left circumflex about 10 years ago  . CARDIAC CATHETERIZATION     11/05/2008  . CORONARY ARTERY BYPASS GRAFT  03/10/2017   LIMA-LAD, SVG-D, SVG-OM, SVG-PDA  . FEMORAL-POPLITEAL BYPASS GRAFT Left 05/20/2015   Procedure: LEFT COMMON FEMORAL ARTERY ANEURYSM REPAIR;  Surgeon: Jerry Dutch, MD;  Location: [place];  Service: Vascular;  Laterality: Left;  . PERCUTANEOUS CORONARY STENT INTERVENTION (PCI-S)     LAD  . PROSTATE SURGERY    . TONSILLECTOMY      Current Medications: Current Meds  Medication Sig  . amiodarone (PACERONE) 200 MG tablet Take 1 tablet (200 mg total) by mouth daily.  [name] aspirin EC 81 MG tablet Take 1 tablet (81 mg total) by mouth daily.  . carvedilol (COREG) 6.25 MG tablet Take 1 tablet (6.25 mg total) by mouth 2 (two) times daily.  . cloNIDine (CATAPRES) 0.3 MG tablet Take 1 tablet (0.3 mg total) by mouth 2 (two) times daily.  . furosemide (LASIX) 20 MG tablet Take 1 tablet (20 mg total) by mouth daily.  [name] losartan (COZAAR) 25 MG tablet Take 1 tablet (25 mg total) by mouth daily.  . Multiple Vitamins-Minerals (MULTIVITAMIN WITH MINERALS) tablet Take 1 tablet by mouth daily.  . nitroGLYCERIN (NITROSTAT) 0.4 MG SL tablet  Place 1 tablet (0.4 mg total) under the tongue every 5 (five) minutes as needed for chest pain.  . pantoprazole (PROTONIX) 40 MG tablet Take 1 tablet (40 mg total) by mouth 2 (two) times daily.  . rosuvastatin (CRESTOR) 10 MG tablet Take 1 tablet (10 mg total) by mouth daily.  [name] spironolactone (ALDACTONE) 25 MG tablet Take 1 tablet (25 mg total) by mouth daily.     Allergies:   Celecoxib, Diclofenac, Statins, Warfarin and related, Lisinopril, and Morphine and related   Social History   Socioeconomic History  . Marital status: Married    Spouse name: Not on file  . Number of children: Not on file  . Years of education: Not on file  . Highest education level: Not on file  Occupational History  . Occupation: Retired  Tobacco Use  . Smoking status: Former Smoker    Packs/day: 1.00    Years: 80.00    Pack years:  80.00    Types: Cigarettes    Quit date: 03/06/2017    Years since quitting: 2.2  . Smokeless tobacco: Never Used  Substance and Sexual Activity  . Alcohol use: No    Alcohol/week: 0.0 standard drinks    Comment: " No alcohol in over a year."  . Drug use: No    Comment: No illicit drug use and only takes multivitamin as far as herbal medication goes.  . Sexual activity: Not on file  Other Topics Concern  . Not on file  Social History Narrative   Lives in [place], [place] with his wife   Remains active, working around his house   Diet is regular    Social Determinants of Radio broadcast assistant Strain:   . Difficulty of Paying Living Expenses:   Food Insecurity:   . Worried About Charity fundraiser in the Last Year:   . Arboriculturist in the Last Year:   Transportation Needs:   . Film/video editor (Medical):   [name] Lack of Transportation (Non-Medical):   Physical Activity:   . Days of Exercise per Week:   . Minutes of Exercise per Session:   Stress:   . Feeling of Stress :   Social Connections:   . Frequency of Communication with Friends and Family:   . Frequency of Social Gatherings with Friends and Family:   . Attends Religious Services:   . Active Member of Clubs or Organizations:   . Attends Archivist Meetings:   [name] Marital Status:       EKGs/Labs/Other Studies Reviewed:    The following studies were reviewed today: Basic metabolic pending  Recent Labs: 05/30/2019: ALT 20; BUN 17; Creatinine, Ser 1.15; Potassium 4.0; Sodium 139; TSH 2.230  Recent Lipid Panel    Component Value Date/Time   CHOL 192 02/12/2018 0946   TRIG 178 (H) 02/12/2018 0946   HDL 45 02/12/2018 0946   CHOLHDL 4.3 02/12/2018 0946   CHOLHDL 6 09/01/2014 1100   VLDL 15.8 09/01/2014 1100   LDLCALC 111 (H) 02/12/2018 0946   LDLDIRECT 106 (H) 12/08/2017 1244   LDLDIRECT 174.2 12/19/2011 0908    Physical Exam:    VS:  BP 122/80   Pulse 78   Ht 5\' 9"  (1.753 m)    Wt 187 lb (84.8 kg)   SpO2 94%   BMI 27.62 kg/m     Wt Readings from Last 3 Encounters:  06/07/19 187 lb (84.8 kg)  05/30/19 190 lb (86.2 kg)  02/18/19  178 lb (80.7 kg)     GEN:  Well nourished, well developed in no acute distress HEENT: Normal NECK: No JVD; No carotid bruits LYMPHATICS: No lymphadenopathy CARDIAC: RRR, no murmurs, rubs, gallops RESPIRATORY:  Clear to auscultation without rales, wheezing or rhonchi  ABDOMEN: Soft, non-tender, protuberant MUSCULOSKELETAL:  No edema; No deformity  SKIN: Warm and dry NEUROLOGIC:  Alert and oriented x 3 PSYCHIATRIC:  Normal affect   ASSESSMENT:    1. Essential hypertension    PLAN:    In order of problems listed above:  Labile hypertension difficult to control -Changes made as above.  Seems to be stable.  We have checked a basic metabolic profile.  Continue to follow.  Has a close follow-up with Cecilie Kicks   Medication Adjustments/Labs and Tests Ordered: Current medicines are reviewed at length with the patient today.  Concerns regarding medicines are outlined above.  No orders of the defined types were placed in this encounter.  No orders of the defined types were placed in this encounter.   Patient Instructions  Medication Instructions:  The current medical regimen is effective;  continue present plan and medications.  *If you need a refill on your cardiac medications before your next appointment, please call your pharmacy*  Follow-Up: Follow up as scheduled with Cecilie Kicks, NP.  Thank you for choosing [facility]!!        Signed, Jerry Furbish, MD  06/07/2019 2:52 PM    [place]

## 2019-06-07 NOTE — Patient Instructions (Signed)
Medication Instructions:  The current medical regimen is effective;  continue present plan and medications.  *If you need a refill on your cardiac medications before your next appointment, please call your pharmacy*  Follow-Up: Follow up as scheduled with Cecilie Kicks, NP.  Thank you for choosing [facility]!!

## 2019-06-10 ENCOUNTER — Telehealth: Payer: Self-pay | Admitting: Nurse Practitioner

## 2019-06-10 DIAGNOSIS — I255 Ischemic cardiomyopathy: Secondary | ICD-10-CM

## 2019-06-10 DIAGNOSIS — I10 Essential (primary) hypertension: Secondary | ICD-10-CM

## 2019-06-10 MED ORDER — FUROSEMIDE 20 MG PO TABS: 20.0000 mg | ORAL_TABLET | Freq: Every day | ORAL | 3 refills | Status: AC | PRN

## 2019-06-10 NOTE — Telephone Encounter (Signed)
Lasix dose adjusted in patient's chart Advised him to continue other medications and work on good hydration with water. Advised that we can schedule a repeat lab appointment on Friday April 9 prior to his telehealth visit with Cecilie Kicks on 4/12.

## 2019-06-10 NOTE — Telephone Encounter (Signed)
-----   Message from Jerline Pain, MD sent at 06/10/2019  8:03 AM EDT ----- Since Creat went up to 1.54 from 1.2, lets stop the lasix 20mg  and use PRN weight up 3 pounds.   Repeat BMET at next visit (on 4/12) with Mickel Baas.  Candee Furbish, MD

## 2019-06-18 ENCOUNTER — Telehealth: Payer: Medicare Other | Admitting: Cardiology

## 2019-06-19 ENCOUNTER — Other Ambulatory Visit: Payer: Self-pay

## 2019-06-19 DIAGNOSIS — I255 Ischemic cardiomyopathy: Secondary | ICD-10-CM

## 2019-06-19 DIAGNOSIS — I10 Essential (primary) hypertension: Secondary | ICD-10-CM

## 2019-06-19 NOTE — Telephone Encounter (Signed)
Informed the patient and his wife there are no openings for appointments on Friday.  Informed them the patient may have his labs drawn in [place] (much closer to their home). Gave them address and phone number to the [facility] there.  They will have labs drawn there and keep VV with Mickel Baas on Monday. They were grateful for assistance.

## 2019-06-21 ENCOUNTER — Other Ambulatory Visit: Payer: Medicare Other

## 2019-06-22 LAB — BASIC METABOLIC PANEL
BUN/Creatinine Ratio: 13 (ref 10–24)
BUN: 18 mg/dL (ref 8–27)
CO2: 25 mmol/L (ref 20–29)
Calcium: 9.4 mg/dL (ref 8.6–10.2)
Chloride: 95 mmol/L — ABNORMAL LOW (ref 96–106)
Creatinine, Ser: 1.36 mg/dL — ABNORMAL HIGH (ref 0.76–1.27)
GFR calc Af Amer: 58 mL/min/{1.73_m2} — ABNORMAL LOW (ref >59)
GFR calc non Af Amer: 50 mL/min/{1.73_m2} — ABNORMAL LOW (ref >59)
Glucose: 125 mg/dL — ABNORMAL HIGH (ref 65–99)
Potassium: 4.8 mmol/L (ref 3.5–5.2)
Sodium: 135 mmol/L (ref 134–144)

## 2019-06-24 ENCOUNTER — Other Ambulatory Visit: Payer: Self-pay

## 2019-06-24 ENCOUNTER — Telehealth: Payer: Medicare Other | Admitting: Nurse Practitioner

## 2019-06-24 ENCOUNTER — Telehealth (INDEPENDENT_AMBULATORY_CARE_PROVIDER_SITE_OTHER): Payer: Medicare Other | Admitting: Cardiology

## 2019-06-24 ENCOUNTER — Encounter: Payer: Self-pay | Admitting: Cardiology

## 2019-06-24 VITALS — BP 150/90 | HR 74 | Ht 69.0 in | Wt 175.0 lb

## 2019-06-24 DIAGNOSIS — I5022 Chronic systolic (congestive) heart failure: Secondary | ICD-10-CM | POA: Diagnosis not present

## 2019-06-24 DIAGNOSIS — I25118 Atherosclerotic heart disease of native coronary artery with other forms of angina pectoris: Secondary | ICD-10-CM

## 2019-06-24 DIAGNOSIS — I255 Ischemic cardiomyopathy: Secondary | ICD-10-CM

## 2019-06-24 DIAGNOSIS — Z7982 Long term (current) use of aspirin: Secondary | ICD-10-CM | POA: Diagnosis not present

## 2019-06-24 DIAGNOSIS — N183 Chronic kidney disease, stage 3 unspecified: Secondary | ICD-10-CM

## 2019-06-24 DIAGNOSIS — R410 Disorientation, unspecified: Secondary | ICD-10-CM

## 2019-06-24 DIAGNOSIS — E785 Hyperlipidemia, unspecified: Secondary | ICD-10-CM

## 2019-06-24 DIAGNOSIS — I130 Hypertensive heart and chronic kidney disease with heart failure and stage 1 through stage 4 chronic kidney disease, or unspecified chronic kidney disease: Secondary | ICD-10-CM | POA: Diagnosis not present

## 2019-06-24 DIAGNOSIS — I10 Essential (primary) hypertension: Secondary | ICD-10-CM

## 2019-06-24 MED ORDER — NITROGLYCERIN 0.4 MG SL SUBL: 0.4000 mg | SUBLINGUAL_TABLET | SUBLINGUAL | 4 refills | Status: DC | PRN

## 2019-06-24 NOTE — Progress Notes (Signed)
Virtual Visit via Telephone Note   This visit type was conducted due to national recommendations for restrictions regarding the COVID-19 Pandemic (e.g. social distancing) in an effort to limit this patient's exposure and mitigate transmission in our community.  Due to his co-morbid illnesses, this patient is at least at moderate risk for complications without adequate follow up.  This format is felt to be most appropriate for this patient at this time.  The patient did not have access to video technology/had technical difficulties with video requiring transitioning to audio format only (telephone).  All issues noted in this document were discussed and addressed.  No physical exam could be performed with this format.  Please refer to the patient's chart for his  consent to telehealth for [facility].   The patient was identified using 2 identifiers.  Date:  06/25/2019   ID:  Jerry Gilbert, DOB Jan 01, 1941, MRN [id number]  Patient Location: Home Provider Location: Office  PCP:  Wyatt Mage, MD  Cardiologist:  Candee Furbish, MD  Electrophysiologist:  None   Evaluation Performed:  Follow-Up Visit  Chief Complaint:  HTN  History of Present Illness:    Jerry Gilbert is a 79 y.o. male with follow up for HTN.  He has a hx of difficult to control hypertension with coronary artery disease status post CABG in December 2018, ischemic cardiomyopathy EF 40%, AAA repair, paroxysmal atrial fibrillation, COPD.  He used to see Dr. Saunders Revel in the past.  No anticoagulation was utilized previously because of GI bleed.  Prior chronic kidney disease stage III with creatinines of 1.8-2.3.  His wife used to work at the MICU at [employer].  She retired in 2006.  Previously, amlodipine dropped his blood pressure pretty rapidly.  It has been several years.  His wife is very hesitant for [place] to start amlodipine again even at 2.5 mg.  If we did start it, she would like for him to sit in our office for about a half an  hour to make sure everything is safe.  He also noticed some dj vu recently.  For instance he watched a new race on television and said he had already seen this.  I asked him to discuss this with Dr. Lurlean Nanny. Per last visit with Dr. Marlou Porch   here for urgent blood pressure follow-up. 06/07/19   He was 210/100 at 1 point at home.  We made the following changes, increased his clonidine to 0.3 twice a day, added losartan 25 mg a day and added Lasix 20 mg a day.  He did notice increased urine output.  His wife showed me blood pressure readings.  The first time he she increase the clonidine his blood pressure did drop pretty significantly for the first 2 readings and then came back up.    Now it has stabilized.  Blood pressures are between 120 and 140 currently at home. His wt is down 4 lbs his last Cr was 1.36 down from 1.54  He needs refills on NTG. He mentions today ( phone call speaker with pt and his wife) some chest pain and SOB. Usually with exertion.  This may be with HTN. His wife said she was not aware of this.  She is concerned with his forgetfulness and is worried the crestor is causing.   The patient does not have symptoms concerning for COVID-19 infection (fever, chills, cough, or new shortness of breath).    Past Medical History:  Diagnosis Date  . AAA (abdominal aortic  aneurysm) ([place]) 11/04/09   3.6 cmx 3.4 cm  . Anemia   . Atrial tachycardia ([place])   . CAD (coronary artery disease)    S/P PCI LAD and subsequent CABG  . Chaotic atrial rhythm   . Dysrhythmia   . H/O transfusion of packed red blood cells    16 units of blood in 2014 d/t GI bleed  . H/O: GI bleed   . Heart murmur    in 1961  . Hypertension   . Ischemic cardiomyopathy   . Kidney stones   . Myocardial infarction ([place])   . Nocturia   . Personal history of unspecified circulatory disease    Transient Ischemic attacks  . Prostate cancer ([place])   . Shortness of breath dyspnea    'with minimal exertion'  . Stroke  [facility])    "small TIA"  . Tobacco abuse    Past Surgical History:  Procedure Laterality Date  . ABDOMINAL AORTIC ANEURYSM REPAIR N/A 12/15/2014   Procedure: ABDOMINAL AORTIC ANEURYSM REPAIR;  Surgeon: Elam Dutch, MD;  Location: [place];  Service: Vascular;  Laterality: N/A;  . Bare-metal stent     Left circumflex about 10 years ago  . CARDIAC CATHETERIZATION     11/05/2008  . CORONARY ARTERY BYPASS GRAFT  03/10/2017   LIMA-LAD, SVG-D, SVG-OM, SVG-PDA  . FEMORAL-POPLITEAL BYPASS GRAFT Left 05/20/2015   Procedure: LEFT COMMON FEMORAL ARTERY ANEURYSM REPAIR;  Surgeon: Elam Dutch, MD;  Location: [place];  Service: Vascular;  Laterality: Left;  . PERCUTANEOUS CORONARY STENT INTERVENTION (PCI-S)     LAD  . PROSTATE SURGERY    . TONSILLECTOMY       Current Meds  Medication Sig  . amiodarone (PACERONE) 200 MG tablet Take 1 tablet (200 mg total) by mouth daily.  [name] aspirin EC 81 MG tablet Take 1 tablet (81 mg total) by mouth daily.  . carvedilol (COREG) 6.25 MG tablet Take 1 tablet (6.25 mg total) by mouth 2 (two) times daily.  . cloNIDine (CATAPRES) 0.3 MG tablet Take 1 tablet (0.3 mg total) by mouth 2 (two) times daily.  . furosemide (LASIX) 20 MG tablet Take 1 tablet (20 mg total) by mouth daily as needed for fluid or edema (weight gain of 3 lb).  . losartan (COZAAR) 25 MG tablet Take 1 tablet (25 mg total) by mouth daily.  . Multiple Vitamins-Minerals (MULTIVITAMIN WITH MINERALS) tablet Take 1 tablet by mouth daily.  . nitroGLYCERIN (NITROSTAT) 0.4 MG SL tablet Place 1 tablet (0.4 mg total) under the tongue every 5 (five) minutes as needed for chest pain.  . pantoprazole (PROTONIX) 40 MG tablet Take 1 tablet (40 mg total) by mouth 2 (two) times daily.  . rosuvastatin (CRESTOR) 10 MG tablet Take 1 tablet (10 mg total) by mouth daily.  [name] spironolactone (ALDACTONE) 25 MG tablet Take 1 tablet (25 mg total) by mouth daily.  . [DISCONTINUED] nitroGLYCERIN (NITROSTAT) 0.4 MG SL tablet Place 1  tablet (0.4 mg total) under the tongue every 5 (five) minutes as needed for chest pain.     Allergies:   Celecoxib, Diclofenac, Statins, Warfarin and related, Lisinopril, and Morphine and related   Social History   Tobacco Use  . Smoking status: Former Smoker    Packs/day: 1.00    Years: 80.00    Pack years: 80.00    Types: Cigarettes    Quit date: 03/06/2017    Years since quitting: 2.3  . Smokeless tobacco: Never Used  Substance Use Topics  .  Alcohol use: No    Alcohol/week: 0.0 standard drinks    Comment: " No alcohol in over a year."  . Drug use: No    Comment: No illicit drug use and only takes multivitamin as far as herbal medication goes.     Family Hx: The patient's family history includes Arrhythmia in his brother; Coronary artery disease in his brother; Diabetes in his brother; Heart attack (age of onset: 54) in his mother; Heart disease in his brother, father, and mother; Hyperlipidemia in his brother; Hypertension in his brother.  ROS:   Please see the history of present illness.    General:no colds or fevers, no weight changes Skin:no rashes or ulcers HEENT:no blurred vision, no congestion CV:see HPI PUL:see HPI GI:no diarrhea constipation or melena, no indigestion GU:no hematuria, no dysuria MS:no joint pain, no claudication Neuro:no syncope, no lightheadedness Endo:no diabetes, no thyroid disease  All other systems reviewed and are negative.   Prior CV studies:   The following studies were reviewed today:  Echo 12/08/17  Study Conclusions   - Left ventricle: Wall thickness was increased in a pattern of  moderate LVH. Systolic function was mildly to moderately reduced.  The estimated ejection fraction was in the range of 40% to 45%.  Inferior hypokinesis. The study is not technically sufficient to  allow evaluation of LV diastolic function.  - Aortic valve: Calcified leaflets. No significant stenosis. Mild  regurgitation.  - Mitral valve:  Mildly thickened leaflets . There was mild  regurgitation. Valve area by pressure half-time: 2.16 cm^2.  - Left atrium: The atrium was mildly dilated.  - Right ventricle: The cavity size was mildly dilated. Mildly  reduced systolic function.  - Atrial septum: Hypermobile IAS. No defect or patent foramen ovale  was identified.  - Tricuspid valve: There was trivial regurgitation.  - Pulmonary arteries: PA peak pressure: 24 mm Hg (S).  - Inferior vena cava: The vessel was normal in size. The  respirophasic diameter changes were in the normal range (= 50%),  consistent with normal central venous pressure.   Impressions:   - Compared to a prior study in 05/2017, the LVEF has improved up to  40-45% with inferior hypokinesis.   Labs/Other Tests and Data Reviewed:    EKG:  No ECG reviewed.  Recent Labs: 05/30/2019: ALT 20; TSH 2.230 06/21/2019: BUN 18; Creatinine, Ser 1.36; Potassium 4.8; Sodium 135   Recent Lipid Panel Lab Results  Component Value Date/Time   CHOL 192 02/12/2018 09:46 AM   TRIG 178 (H) 02/12/2018 09:46 AM   HDL 45 02/12/2018 09:46 AM   CHOLHDL 4.3 02/12/2018 09:46 AM   CHOLHDL 6 09/01/2014 11:00 AM   LDLCALC 111 (H) 02/12/2018 09:46 AM   LDLDIRECT 106 (H) 12/08/2017 12:44 PM   LDLDIRECT 174.2 12/19/2011 09:08 AM    Wt Readings from Last 3 Encounters:  06/24/19 175 lb (79.4 kg)  06/07/19 187 lb (84.8 kg)  05/30/19 190 lb (86.2 kg)     Objective:    Vital Signs:  BP (!) 150/90   Pulse 74   Ht 5' 9"  (1.753 m)   Wt 175 lb (79.4 kg)   BMI 25.84 kg/m    VITAL SIGNS:  reviewed  General NAD   ASSESSMENT & PLAN:    1. HTN better controlled will continue current meds and check BMP  2. CAD with chest pain and DOE will check echo for now if abnormal may need nuc study.  The dyspnea may be related to  COPD follow up same day as echo because they have to drive 2 hours to get here.  3. Confusion, will hold Crestor for 2 weeks with plans to resume if no  change in confusion.  His wife will also arrange follow up with PCP 4. PAF with some brief episodes on his monitor  5. CKD increase will recheck BMP  6. HLD will check lipids and hepatic   COVID-19 Education: The signs and symptoms of COVID-19 were discussed with the patient and how to seek care for testing (follow up with PCP or arrange E-visit).  The importance of social distancing was discussed today.  Time:   Today, I have spent 8 minutes with the patient with telehealth technology discussing the above problems.     Medication Adjustments/Labs and Tests Ordered: Current medicines are reviewed at length with the patient today.  Concerns regarding medicines are outlined above.   Tests Ordered: Orders Placed This Encounter  Procedures  . Comp Met (CMET)  . Lipid panel  . ECHOCARDIOGRAM COMPLETE    Medication Changes: Meds ordered this encounter  Medications  . nitroGLYCERIN (NITROSTAT) 0.4 MG SL tablet    Sig: Place 1 tablet (0.4 mg total) under the tongue every 5 (five) minutes as needed for chest pain.    Dispense:  25 tablet    Refill:  4    Follow Up:  In Person in 2 week(s)  Signed, Cecilie Kicks, NP  06/25/2019 8:47 PM    [facility]

## 2019-06-24 NOTE — Patient Instructions (Signed)
Your physician has recommended you make the following change in your medication:  HOLD ROSUVASTATIN  Your physician recommends that you return for lab work in:  Thursday FASTING [place]  Your physician has requested that you have an echocardiogram. Echocardiography is a painless test that uses sound waves to create images of your heart. It provides your doctor with information about the size and shape of your heart and how well your heart's chambers and valves are working. This procedure takes approximately one hour. There are no restrictions for this procedure.  IN 2 WEEKS  Your physician recommends that you schedule a follow-up appointment in:  2 WEEKS WITH DR Marlou Porch OR Cecilie Kicks NP SAME DAY AS ECHO

## 2019-06-25 ENCOUNTER — Encounter: Payer: Self-pay | Admitting: Cardiology

## 2019-06-27 ENCOUNTER — Telehealth: Payer: Self-pay | Admitting: Cardiology

## 2019-06-27 DIAGNOSIS — E785 Hyperlipidemia, unspecified: Secondary | ICD-10-CM

## 2019-06-27 NOTE — Telephone Encounter (Signed)
Orders have been released  

## 2019-06-27 NOTE — Telephone Encounter (Signed)
New Message    Jerry Gilbert is calling and says the pt is there for lab work but she does not have the requests  Their office closes at 1230 for lunch    Please advise

## 2019-06-28 LAB — COMPREHENSIVE METABOLIC PANEL
ALT: 26 IU/L (ref 0–44)
AST: 24 IU/L (ref 0–40)
Albumin/Globulin Ratio: 1.7 (ref 1.2–2.2)
Albumin: 4.3 g/dL (ref 3.7–4.7)
Alkaline Phosphatase: 129 IU/L — ABNORMAL HIGH (ref 39–117)
BUN/Creatinine Ratio: 14 (ref 10–24)
BUN: 20 mg/dL (ref 8–27)
Bilirubin Total: 0.5 mg/dL (ref 0.0–1.2)
CO2: 24 mmol/L (ref 20–29)
Calcium: 9.7 mg/dL (ref 8.6–10.2)
Chloride: 96 mmol/L (ref 96–106)
Creatinine, Ser: 1.41 mg/dL — ABNORMAL HIGH (ref 0.76–1.27)
GFR calc Af Amer: 55 mL/min/{1.73_m2} — ABNORMAL LOW (ref >59)
GFR calc non Af Amer: 48 mL/min/{1.73_m2} — ABNORMAL LOW (ref >59)
Globulin, Total: 2.5 g/dL (ref 1.5–4.5)
Glucose: 99 mg/dL (ref 65–99)
Potassium: 5.1 mmol/L (ref 3.5–5.2)
Sodium: 134 mmol/L (ref 134–144)
Total Protein: 6.8 g/dL (ref 6.0–8.5)

## 2019-06-28 LAB — LIPID PANEL
Chol/HDL Ratio: 4.6 ratio (ref 0.0–5.0)
Cholesterol, Total: 204 mg/dL — ABNORMAL HIGH (ref 100–199)
HDL: 44 mg/dL (ref >39)
LDL Chol Calc (NIH): 127 mg/dL — ABNORMAL HIGH (ref 0–99)
Triglycerides: 184 mg/dL — ABNORMAL HIGH (ref 0–149)
VLDL Cholesterol Cal: 33 mg/dL (ref 5–40)

## 2019-07-04 MED ORDER — SPIRONOLACTONE 25 MG PO TABS: 12.5000 mg | ORAL_TABLET | Freq: Every day | ORAL | 3 refills | Status: DC

## 2019-07-04 NOTE — Telephone Encounter (Signed)
Placed call to pt.  Him and his wife, Jerry Gilbert, both have been made aware to decrease the Aldactone to 1/2 tablet daily = 12.5 mg qd They have been made aware to f/u with PCP re: Alk Phos.  They both verbalized understanding.

## 2019-07-09 ENCOUNTER — Ambulatory Visit (HOSPITAL_COMMUNITY): Payer: Medicare Other | Attending: Cardiology

## 2019-07-09 ENCOUNTER — Encounter: Payer: Self-pay | Admitting: Cardiology

## 2019-07-09 ENCOUNTER — Other Ambulatory Visit: Payer: Self-pay

## 2019-07-09 ENCOUNTER — Ambulatory Visit (INDEPENDENT_AMBULATORY_CARE_PROVIDER_SITE_OTHER): Payer: Medicare Other | Admitting: Cardiology

## 2019-07-09 VITALS — BP 120/80 | HR 50 | Ht 69.0 in | Wt 187.0 lb

## 2019-07-09 DIAGNOSIS — I10 Essential (primary) hypertension: Secondary | ICD-10-CM | POA: Diagnosis not present

## 2019-07-09 DIAGNOSIS — I2510 Atherosclerotic heart disease of native coronary artery without angina pectoris: Secondary | ICD-10-CM | POA: Diagnosis not present

## 2019-07-09 DIAGNOSIS — I255 Ischemic cardiomyopathy: Secondary | ICD-10-CM

## 2019-07-09 DIAGNOSIS — I5022 Chronic systolic (congestive) heart failure: Secondary | ICD-10-CM | POA: Diagnosis present

## 2019-07-09 NOTE — Patient Instructions (Signed)
Medication Instructions:  The current medical regimen is effective;  continue present plan and medications.  *If you need a refill on your cardiac medications before your next appointment, please call your pharmacy*  Testing/Procedures: Your physician has requested that you have a renal artery duplex. During this test, an ultrasound is used to evaluate blood flow to the kidneys. Allow one hour for this exam. Do not eat after midnight the day before and avoid carbonated beverages. Take your medications as you usually do.  Follow-Up: At [facility], you and your health needs are our priority.  As part of our continuing mission to provide you with exceptional heart care, we have created designated Provider Care Teams.  These Care Teams include your primary Cardiologist (physician) and Advanced Practice Providers (APPs -  Physician Assistants and Nurse Practitioners) who all work together to provide you with the care you need, when you need it.  We recommend signing up for the patient portal called "MyChart".  Sign up information is provided on this After Visit Summary.  MyChart is used to connect with patients for Virtual Visits (Telemedicine).  Patients are able to view lab/test results, encounter notes, upcoming appointments, etc.  Non-urgent messages can be sent to your provider as well.   To learn more about what you can do with MyChart, go to [email or web address].    Your next appointment:   6 month(s)  The format for your next appointment:   In Person  Provider:   Cecilie Kicks, NP and Dr Marlou Porch in 1 year.  Thank you for choosing [facility]!!

## 2019-07-09 NOTE — Progress Notes (Signed)
Cardiology Office Note:    Date:  07/09/2019   ID:  Jerry Gilbert, DOB 1940-06-29, MRN [id number]  PCP:  Jerry Mage, MD  Cardiologist:  Candee Furbish, MD  Electrophysiologist:  None   Referring MD: Jerry Mage, MD     History of Present Illness:    Jerry Gilbert is a 79 y.o. male here for the follow-up of hypertension that has been difficult to control with coronary artery disease status post CABG 2018 ischemic cardiomyopathy EF 40% AAA repair PAF COPD  CKD 3 1.--2.3 wife worked in the MICU at [employer].  In the past, amlodipine dropped his blood pressure very rapidly.  Wife is very hesitant to start it again even at 2.5.  If we did decide to start it he wanted him to sit in our office for about an hour or 30 minutes to make sure everything was okay.  He has been having some dj vu recently as well.  Ended up seeing Jerry Gilbert on 06/24/2019 his blood pressures were running between 120 and 140 at that time at home.  Creatinine was down to 1.36 from 1.54.  He has had some exertional shortness of breath and chest pain.  Wife said she was not aware of this.  She was worried that the forgetfulness was by Crestor.  See below for details.  Overall his blood pressure is much improved.  Past Medical History:  Diagnosis Date  . AAA (abdominal aortic aneurysm) ([place]) 11/04/09   3.6 cmx 3.4 cm  . Anemia   . Atrial tachycardia ([place])   . CAD (coronary artery disease)    S/P PCI LAD and subsequent CABG  . Chaotic atrial rhythm   . Dysrhythmia   . H/O transfusion of packed red blood cells    16 units of blood in 2014 d/t GI bleed  . H/O: GI bleed   . Heart murmur    in 1961  . Hypertension   . Ischemic cardiomyopathy   . Kidney stones   . Myocardial infarction ([place])   . Nocturia   . Personal history of unspecified circulatory disease    Transient Ischemic attacks  . Prostate cancer ([place])   . Shortness of breath dyspnea    'with minimal exertion'  . Stroke [facility])    "small TIA"  .  Tobacco abuse     Past Surgical History:  Procedure Laterality Date  . ABDOMINAL AORTIC ANEURYSM REPAIR N/A 12/15/2014   Procedure: ABDOMINAL AORTIC ANEURYSM REPAIR;  Surgeon: Elam Dutch, MD;  Location: [place];  Service: Vascular;  Laterality: N/A;  . Bare-metal stent     Left circumflex about 10 years ago  . CARDIAC CATHETERIZATION     11/05/2008  . CORONARY ARTERY BYPASS GRAFT  03/10/2017   LIMA-LAD, SVG-D, SVG-OM, SVG-PDA  . FEMORAL-POPLITEAL BYPASS GRAFT Left 05/20/2015   Procedure: LEFT COMMON FEMORAL ARTERY ANEURYSM REPAIR;  Surgeon: Elam Dutch, MD;  Location: [place];  Service: Vascular;  Laterality: Left;  . PERCUTANEOUS CORONARY STENT INTERVENTION (PCI-S)     LAD  . PROSTATE SURGERY    . TONSILLECTOMY      Current Medications: Current Meds  Medication Sig  . amiodarone (PACERONE) 200 MG tablet Take 1 tablet (200 mg total) by mouth daily.  [name] aspirin EC 81 MG tablet Take 1 tablet (81 mg total) by mouth daily.  . carvedilol (COREG) 6.25 MG tablet Take 1 tablet (6.25 mg total) by mouth 2 (two) times daily.  . cloNIDine (  CATAPRES) 0.3 MG tablet Take 1 tablet (0.3 mg total) by mouth 2 (two) times daily.  . furosemide (LASIX) 20 MG tablet Take 1 tablet (20 mg total) by mouth daily as needed for fluid or edema (weight gain of 3 lb).  . losartan (COZAAR) 25 MG tablet Take 1 tablet (25 mg total) by mouth daily.  . Multiple Vitamins-Minerals (MULTIVITAMIN WITH MINERALS) tablet Take 1 tablet by mouth daily.  . nitroGLYCERIN (NITROSTAT) 0.4 MG SL tablet Place 1 tablet (0.4 mg total) under the tongue every 5 (five) minutes as needed for chest pain.  . pantoprazole (PROTONIX) 40 MG tablet Take 1 tablet (40 mg total) by mouth 2 (two) times daily.  [name] spironolactone (ALDACTONE) 25 MG tablet Take 0.5 tablets (12.5 mg total) by mouth daily.     Allergies:   Celecoxib, Diclofenac, Statins, Warfarin and related, Lisinopril, and Morphine and related   Social History   Socioeconomic  History  . Marital status: Married    Spouse name: Not on file  . Number of children: Not on file  . Years of education: Not on file  . Highest education level: Not on file  Occupational History  . Occupation: Retired  Tobacco Use  . Smoking status: Former Smoker    Packs/day: 1.00    Years: 80.00    Pack years: 80.00    Types: Cigarettes    Quit date: 03/06/2017    Years since quitting: 2.3  . Smokeless tobacco: Never Used  Substance and Sexual Activity  . Alcohol use: No    Alcohol/week: 0.0 standard drinks    Comment: " No alcohol in over a year."  . Drug use: No    Comment: No illicit drug use and only takes multivitamin as far as herbal medication goes.  . Sexual activity: Not on file  Other Topics Concern  . Not on file  Social History Narrative   Lives in [place], [place] with his wife   Remains active, working around his house   Diet is regular    Social Determinants of Radio broadcast assistant Strain:   . Difficulty of Paying Living Expenses:   Food Insecurity:   . Worried About Charity fundraiser in the Last Year:   . Arboriculturist in the Last Year:   Transportation Needs:   . Film/video editor (Medical):   [name] Lack of Transportation (Non-Medical):   Physical Activity:   . Days of Exercise per Week:   . Minutes of Exercise per Session:   Stress:   . Feeling of Stress :   Social Connections:   . Frequency of Communication with Friends and Family:   . Frequency of Social Gatherings with Friends and Family:   . Attends Religious Services:   . Active Member of Clubs or Organizations:   . Attends Archivist Meetings:   [name] Marital Status:      Family History: The patient's family history includes Arrhythmia in his brother; Coronary artery disease in his brother; Diabetes in his brother; Heart attack (age of onset: 18) in his mother; Heart disease in his brother, father, and mother; Hyperlipidemia in his brother; Hypertension in his  brother.    EKGs/Labs/Other Studies Reviewed:    The following studies were reviewed today:  40 to 45% EF prior  EKG:  EKG is not ordered today.    Recent Labs: 05/30/2019: TSH 2.230 06/27/2019: ALT 26; BUN 20; Creatinine, Ser 1.41; Potassium 5.1; Sodium 134  Recent Lipid  Panel    Component Value Date/Time   CHOL 204 (H) 06/27/2019 1224   TRIG 184 (H) 06/27/2019 1224   HDL 44 06/27/2019 1224   CHOLHDL 4.6 06/27/2019 1224   CHOLHDL 6 09/01/2014 1100   VLDL 15.8 09/01/2014 1100   LDLCALC 127 (H) 06/27/2019 1224   LDLDIRECT 106 (H) 12/08/2017 1244   LDLDIRECT 174.2 12/19/2011 0908    Physical Exam:    VS:  BP 120/80   Pulse (!) 50   Ht 5\' 9"  (1.753 m)   Wt 187 lb (84.8 kg)   SpO2 97%   BMI 27.62 kg/m     Wt Readings from Last 3 Encounters:  07/09/19 187 lb (84.8 kg)  06/24/19 175 lb (79.4 kg)  06/07/19 187 lb (84.8 kg)     GEN:  Well nourished, well developed in no acute distress HEENT: Normal NECK: No JVD; No carotid bruits LYMPHATICS: No lymphadenopathy CARDIAC: RRR, no murmurs, rubs, gallops RESPIRATORY:  Clear to auscultation without rales, wheezing or rhonchi  ABDOMEN: Soft, non-tender, non-distended MUSCULOSKELETAL:  No edema; No deformity  SKIN: Warm and dry NEUROLOGIC:  Alert and oriented x 3 PSYCHIATRIC:  Normal affect   ASSESSMENT:    1. Essential hypertension   2. Coronary artery disease involving native coronary artery of native heart without angina pectoris   3. Ischemic cardiomyopathy    PLAN:    In order of problems listed above:  Essential hypertension -Better control at last visit with telemedicine.  Still doing better.  I think we will continue with her current regimen.  His wife did ask why it suddenly go up, and because of this, I do think that checking a renal duplex may be helpful to make sure that he does not have any new flow issues to kidneys.  CAD -Echocardiogram and potentially nuclear.  EF was 40 to 45%.  In the past.  New  echo was performed today.  They will see the results later.  Confusion -Dj vu-like instances.  Holding the Crestor for 2 weeks to see if it improves.  His wife will be talking with PCP as well.  He has been off of it now for a week and may be there is some improvement but certainly not alleviation of symptoms.  Excessive fatigue -Could be a side effect of his clonidine.  Wonder if sleep apnea may be playing a role.  They will be addressing with Dr. Lurlean Nanny as well.  CKD -Recheck basic metabolic profile was [id number], LDL 127.  Explained that we would tolerate an increase in his creatinine with certain medications like angiotensin receptor blocker etc.   Medication Adjustments/Labs and Tests Ordered: Current medicines are reviewed at length with the patient today.  Concerns regarding medicines are outlined above.  Orders Placed This Encounter  Procedures  . VAS US RENAL ARTERY DUPLEX   No orders of the defined types were placed in this encounter.   Patient Instructions  Medication Instructions:  The current medical regimen is effective;  continue present plan and medications.  *If you need a refill on your cardiac medications before your next appointment, please call your pharmacy*  Testing/Procedures: Your physician has requested that you have a renal artery duplex. During this test, an ultrasound is used to evaluate blood flow to the kidneys. Allow one hour for this exam. Do not eat after midnight the day before and avoid carbonated beverages. Take your medications as you usually do.  Follow-Up: At [facility], you and your health needs are  our priority.  As part of our continuing mission to provide you with exceptional heart care, we have created designated Provider Care Teams.  These Care Teams include your primary Cardiologist (physician) and Advanced Practice Providers (APPs -  Physician Assistants and Nurse Practitioners) who all work together to provide you with the care you need,  when you need it.  We recommend signing up for the patient portal called "MyChart".  Sign up information is provided on this After Visit Summary.  MyChart is used to connect with patients for Virtual Visits (Telemedicine).  Patients are able to view lab/test results, encounter notes, upcoming appointments, etc.  Non-urgent messages can be sent to your provider as well.   To learn more about what you can do with MyChart, go to [email or web address].    Your next appointment:   6 month(s)  The format for your next appointment:   In Person  Provider:   Cecilie Kicks, NP and Dr Marlou Porch in 1 year.  Thank you for choosing [facility]!!        Signed, Candee Furbish, MD  07/09/2019 3:13 PM    [place]

## 2019-07-19 ENCOUNTER — Other Ambulatory Visit: Payer: Self-pay

## 2019-07-19 ENCOUNTER — Ambulatory Visit (HOSPITAL_COMMUNITY)
Admission: RE | Admit: 2019-07-19 | Discharge: 2019-07-19 | Disposition: A | Discharge status: Home / Self Care | Payer: Medicare Other | Source: Ambulatory Visit | Attending: Cardiology | Admitting: Cardiology

## 2019-07-19 DIAGNOSIS — I10 Essential (primary) hypertension: Secondary | ICD-10-CM

## 2019-09-20 ENCOUNTER — Telehealth: Payer: Self-pay | Admitting: Cardiology

## 2019-09-20 NOTE — Telephone Encounter (Signed)
Attempted to call wife again - LM to cb if they have any questions/concerns.

## 2019-09-20 NOTE — Telephone Encounter (Signed)
Pt c/o medication issue:  1. Name of Medication:  cloNIDine (CATAPRES) 0.3 MG tablet carvedilol (COREG) 6.25 MG tablet  2. How are you currently taking this medication (dosage and times per day)? Takes both medications 1 tablet 2 times daily  3. Are you having a reaction (difficulty breathing--STAT)? no  4. What is your medication issue? Patient's wife stating the patient is supposed to take these medications 12 hrs apart, but accidentally took them 6 hrs apart. She states he is not having any symptoms and his BP is 130/70. Please advise

## 2019-09-20 NOTE — Telephone Encounter (Signed)
Attempted to contact wife(DPR on file).  Left message to continue to monitor HR and BP.  If this occurred today - hold this evening dose and restart in the morning.  Advised to c/b with any further questions or concerns.

## 2019-09-23 NOTE — Telephone Encounter (Signed)
Lm to cb if any questions or concerns regarding his medications.

## 2019-11-05 ENCOUNTER — Other Ambulatory Visit: Payer: Self-pay | Admitting: Internal Medicine

## 2019-11-05 ENCOUNTER — Other Ambulatory Visit: Payer: Self-pay | Admitting: Cardiology

## 2020-04-09 ENCOUNTER — Other Ambulatory Visit: Payer: Self-pay | Admitting: Internal Medicine

## 2020-05-08 ENCOUNTER — Other Ambulatory Visit: Payer: Self-pay

## 2020-05-08 ENCOUNTER — Telehealth: Payer: Self-pay | Admitting: Internal Medicine

## 2020-05-08 ENCOUNTER — Other Ambulatory Visit: Payer: Self-pay | Admitting: Cardiology

## 2020-05-08 ENCOUNTER — Other Ambulatory Visit: Payer: Self-pay | Admitting: Internal Medicine

## 2020-05-08 MED ORDER — CARVEDILOL 6.25 MG PO TABS: 6.2500 mg | ORAL_TABLET | Freq: Two times a day (BID) | ORAL | 0 refills | Status: DC

## 2020-05-08 MED ORDER — PANTOPRAZOLE SODIUM 40 MG PO TBEC: 40.0000 mg | DELAYED_RELEASE_TABLET | Freq: Two times a day (BID) | ORAL | 0 refills | Status: DC

## 2020-05-08 NOTE — Telephone Encounter (Signed)
Pt's medication was sent to pt's pharmacy as requested. Confirmation received.  °

## 2020-05-08 NOTE — Telephone Encounter (Signed)
*  STAT* If patient is at the pharmacy, call can be transferred to refill team.   1. Which medications need to be refilled? (please list name of each medication and dose if known)  pantoprazole (PROTONIX) 40 MG tablet cloNIDine (CATAPRES) 0.3 MG tablet cloNIDine (CATAPRES) 0.2 MG tablet carvedilol (COREG) 6.25 MG tablet  2. Which pharmacy/location (including street and city if local pharmacy) is medication to be sent to? [place]  3. Do they need a 30 day or 90 day supply? 90 day supply   Patient has an appointment scheduled for 05/21/20 with Dr. Rayann Heman. Patient will be out of Pantoprazole in 3 days.

## 2020-05-08 NOTE — Telephone Encounter (Signed)
Pt's medications were sent to pt's pharmacy as requested. Confirmation received.  

## 2020-05-11 ENCOUNTER — Other Ambulatory Visit: Payer: Self-pay | Admitting: Internal Medicine

## 2020-05-11 NOTE — Telephone Encounter (Signed)
Called pt's wife to inform her that the pt's medication was already sent to pt"s pharmacy as requested. Confirmation received.

## 2020-05-11 NOTE — Telephone Encounter (Signed)
Pt is calling requesting a refill on clonidine 0.2 mg tablets. This medication was D/C and increased to 0.3 mg. Pt states that he still takes 0.2 mg in the am and 0.3 mg in the Pm. Pt would like a call back concerning this matter. Please address

## 2020-05-21 ENCOUNTER — Ambulatory Visit (INDEPENDENT_AMBULATORY_CARE_PROVIDER_SITE_OTHER): Payer: Medicare Other | Admitting: Internal Medicine

## 2020-05-21 ENCOUNTER — Other Ambulatory Visit: Payer: Self-pay

## 2020-05-21 ENCOUNTER — Encounter: Payer: Self-pay | Admitting: Internal Medicine

## 2020-05-21 VITALS — BP 148/98 | HR 77 | Ht 69.0 in | Wt 187.4 lb

## 2020-05-21 DIAGNOSIS — I2510 Atherosclerotic heart disease of native coronary artery without angina pectoris: Secondary | ICD-10-CM

## 2020-05-21 DIAGNOSIS — I255 Ischemic cardiomyopathy: Secondary | ICD-10-CM | POA: Diagnosis not present

## 2020-05-21 DIAGNOSIS — I10 Essential (primary) hypertension: Secondary | ICD-10-CM

## 2020-05-21 DIAGNOSIS — I480 Paroxysmal atrial fibrillation: Secondary | ICD-10-CM

## 2020-05-21 NOTE — Progress Notes (Signed)
PCP: Wyatt Mage, MD Primary Cardiologist: Dr Marlou Porch Primary EP: Dr Rayann Heman  [name] is a 80 y.o. male who presents today for routine electrophysiology followup.  Since last being seen in our clinic, the patient has had substantial decline.  His wife reports some confusion.  He has had marked personality change per his wife over the past year.  SOB is stable.  He continues to smoke. Today, he denies symptoms of palpitations, chest pain,  lower extremity edema, dizziness, presyncope, or syncope.  The patient is otherwise without complaint today.   Past Medical History:  Diagnosis Date  . AAA (abdominal aortic aneurysm) ([place]) 11/04/09   3.6 cmx 3.4 cm  . Anemia   . Atrial tachycardia ([place])   . CAD (coronary artery disease)    S/P PCI LAD and subsequent CABG  . Chaotic atrial rhythm   . Dysrhythmia   . H/O transfusion of packed red blood cells    16 units of blood in 2014 d/t GI bleed  . H/O: GI bleed   . Heart murmur    in 1961  . Hypertension   . Ischemic cardiomyopathy   . Kidney stones   . Myocardial infarction ([place])   . Nocturia   . Personal history of unspecified circulatory disease    Transient Ischemic attacks  . Prostate cancer ([place])   . Shortness of breath dyspnea    'with minimal exertion'  . Stroke [facility])    "small TIA"  . Tobacco abuse    Past Surgical History:  Procedure Laterality Date  . ABDOMINAL AORTIC ANEURYSM REPAIR N/A 12/15/2014   Procedure: ABDOMINAL AORTIC ANEURYSM REPAIR;  Surgeon: Elam Dutch, MD;  Location: [place];  Service: Vascular;  Laterality: N/A;  . Bare-metal stent     Left circumflex about 10 years ago  . CARDIAC CATHETERIZATION     11/05/2008  . CORONARY ARTERY BYPASS GRAFT  03/10/2017   LIMA-LAD, SVG-D, SVG-OM, SVG-PDA  . FEMORAL-POPLITEAL BYPASS GRAFT Left 05/20/2015   Procedure: LEFT COMMON FEMORAL ARTERY ANEURYSM REPAIR;  Surgeon: Elam Dutch, MD;  Location: [place];  Service: Vascular;  Laterality: Left;  . PERCUTANEOUS  CORONARY STENT INTERVENTION (PCI-S)     LAD  . PROSTATE SURGERY    . TONSILLECTOMY      ROS- all systems are reviewed and negatives except as per HPI above  Current Outpatient Medications  Medication Sig Dispense Refill  . aspirin EC 81 MG tablet Take 1 tablet (81 mg total) by mouth daily. 90 tablet 3  . carvedilol (COREG) 6.25 MG tablet Take 1 tablet (6.25 mg total) by mouth 2 (two) times daily. 60 tablet 0  . cloNIDine (CATAPRES) 0.2 MG tablet Take 1 tablet (0.2 mg total) by mouth daily. 30 tablet 0  . cloNIDine (CATAPRES) 0.3 MG tablet Take 1 tablet (0.3 mg total) by mouth 2 (two) times daily. Please make yearly appt with Dr. Marlou Porch for April 2022 for future refills. Thank you 1st attempt 180 tablet 0  . furosemide (LASIX) 20 MG tablet Take 1 tablet (20 mg total) by mouth daily as needed for fluid or edema (weight gain of 3 lb). 90 tablet 3  . losartan (COZAAR) 25 MG tablet Take 1 tablet by mouth once daily 90 tablet 2  . Multiple Vitamins-Minerals (MULTIVITAMIN WITH MINERALS) tablet Take 1 tablet by mouth daily.    . nitroGLYCERIN (NITROSTAT) 0.4 MG SL tablet Place 1 tablet (0.4 mg total) under the tongue every 5 (five) minutes  as needed for chest pain. 25 tablet 4  . PACERONE 200 MG tablet Take 1 tablet by mouth once daily 90 tablet 2  . pantoprazole (PROTONIX) 40 MG tablet Take 1 tablet (40 mg total) by mouth 2 (two) times daily. Please keep upcoming appt in March 2022 before anymore refills. Thank you 60 tablet 0  . spironolactone (ALDACTONE) 25 MG tablet Take 0.5 tablets (12.5 mg total) by mouth daily. 45 tablet 3   No current facility-administered medications for this visit.    Physical Exam: Vitals:   05/21/20 1527  BP: (!) 148/98  Pulse: 77  SpO2: 98%  Weight: 187 lb 6.4 oz (85 kg)  Height: 5\' 9"  (1.753 m)    GEN- The patient is well appearing, alert with flat affect  Head- normocephalic, atraumatic Eyes-  Sclera clear, conjunctiva pink Ears- hearing  intact Oropharynx- clear Lungs- Clear to ausculation bilaterally, normal work of breathing Heart- Regular rate and rhythm, no murmurs, rubs or gallops, PMI not laterally displaced GI- soft, NT, ND, + BS Extremities- no clubbing, cyanosis, or edema  Wt Readings from Last 3 Encounters:  05/21/20 187 lb 6.4 oz (85 kg)  07/09/19 187 lb (84.8 kg)  06/24/19 175 lb (79.4 kg)    EKG tracing ordered today is personally reviewed and shows sinus with PVCs  Assessment and Plan:  1. Persistent atrial fibrillation He has done well with amiodarone We will need to follow him closely on this medicine to avoid toxicity Check lfts, tfts today  2. HTN Elevated today His wife brings BP results from home which reveal controlled BP  3. CAD/ ischemic CM s/p CABG Stable EF 40-45% No change required today  4. Tobacco He is still smoking and not ready to quit  Risks, benefits and potential toxicities for medications prescribed and/or refilled reviewed with patient today.   Return in a year to see EP PA  Thompson Grayer MD, [facility] 05/21/2020 3:50 PM

## 2020-05-21 NOTE — Patient Instructions (Signed)
Medication Instructions:  Your physician recommends that you continue on your current medications as directed. Please refer to the Current Medication list given to you today.  Labwork: Cmet, TSH   Testing/Procedures: None ordered.  Follow-Up: Your physician wants you to follow-up in: one year with   Legrand Como "Jonni Sanger" Tillery, PA-C   You will receive a reminder letter in the mail two months in advance. If you don't receive a letter, please call our office to schedule the follow-up appointment.  Any Other Special Instructions Will Be Listed Below (If Applicable).  If you need a refill on your cardiac medications before your next appointment, please call your pharmacy.

## 2020-05-22 LAB — COMPREHENSIVE METABOLIC PANEL
ALT: 15 IU/L (ref 0–44)
AST: 14 IU/L (ref 0–40)
Albumin/Globulin Ratio: 1.5 (ref 1.2–2.2)
Albumin: 4.1 g/dL (ref 3.7–4.7)
Alkaline Phosphatase: 153 IU/L — ABNORMAL HIGH (ref 44–121)
BUN/Creatinine Ratio: 11 (ref 10–24)
BUN: 11 mg/dL (ref 8–27)
Bilirubin Total: 0.3 mg/dL (ref 0.0–1.2)
CO2: 25 mmol/L (ref 20–29)
Calcium: 9.3 mg/dL (ref 8.6–10.2)
Chloride: 96 mmol/L (ref 96–106)
Creatinine, Ser: 0.98 mg/dL (ref 0.76–1.27)
Globulin, Total: 2.8 g/dL (ref 1.5–4.5)
Glucose: 117 mg/dL — ABNORMAL HIGH (ref 65–99)
Potassium: 4.5 mmol/L (ref 3.5–5.2)
Sodium: 137 mmol/L (ref 134–144)
Total Protein: 6.9 g/dL (ref 6.0–8.5)
eGFR: 79 mL/min/{1.73_m2} (ref >59)

## 2020-05-22 LAB — TSH: TSH: 2.98 u[IU]/mL (ref 0.450–4.500)

## 2020-06-02 ENCOUNTER — Other Ambulatory Visit: Payer: Self-pay | Admitting: Internal Medicine

## 2020-07-21 ENCOUNTER — Other Ambulatory Visit: Payer: Self-pay

## 2020-07-21 ENCOUNTER — Encounter: Payer: Self-pay | Admitting: Cardiology

## 2020-07-21 ENCOUNTER — Ambulatory Visit (INDEPENDENT_AMBULATORY_CARE_PROVIDER_SITE_OTHER): Payer: Medicare Other | Admitting: Cardiology

## 2020-07-21 VITALS — BP 160/90 | HR 64 | Ht 69.0 in | Wt 182.0 lb

## 2020-07-21 DIAGNOSIS — I10 Essential (primary) hypertension: Secondary | ICD-10-CM

## 2020-07-21 DIAGNOSIS — I480 Paroxysmal atrial fibrillation: Secondary | ICD-10-CM

## 2020-07-21 DIAGNOSIS — I2510 Atherosclerotic heart disease of native coronary artery without angina pectoris: Secondary | ICD-10-CM

## 2020-07-21 DIAGNOSIS — I255 Ischemic cardiomyopathy: Secondary | ICD-10-CM | POA: Diagnosis not present

## 2020-07-21 NOTE — Progress Notes (Signed)
Cardiology Office Note:    Date:  07/21/2020   ID:  Jerry Gilbert, DOB 09-Apr-1940, MRN [id number]  PCP:  Wyatt Mage, MD   [facility] Providers Cardiologist:  Candee Furbish, MD     Referring MD: Wyatt Mage, MD     History of Present Illness:    Jerry Gilbert is a 80 y.o. male here for follow-up persistent atrial fibrillation coronary artery disease status post CABG with ischemic cardiomyopathy chronic systolic heart failure, tobacco use hypertension.  In review of Dr. Jackalyn Lombard note from 05/21/2020 he was having quite a substantial decline.  Some confusion.  Personality changes.  Shortness of breath has been stable, continue to smoke.  No chest pain no syncope.  According to his wife, his confusion has improved.  Blood pressure still remains labile.    Past Medical History:  Diagnosis Date  . AAA (abdominal aortic aneurysm) ([place]) 11/04/09   3.6 cmx 3.4 cm  . Anemia   . Atrial tachycardia ([place])   . CAD (coronary artery disease)    S/P PCI LAD and subsequent CABG  . Chaotic atrial rhythm   . Dysrhythmia   . H/O transfusion of packed red blood cells    16 units of blood in 2014 d/t GI bleed  . H/O: GI bleed   . Heart murmur    in 1961  . Hypertension   . Ischemic cardiomyopathy   . Kidney stones   . Myocardial infarction ([place])   . Nocturia   . Personal history of unspecified circulatory disease    Transient Ischemic attacks  . Prostate cancer ([place])   . Shortness of breath dyspnea    'with minimal exertion'  . Stroke [facility])    "small TIA"  . Tobacco abuse     Past Surgical History:  Procedure Laterality Date  . ABDOMINAL AORTIC ANEURYSM REPAIR N/A 12/15/2014   Procedure: ABDOMINAL AORTIC ANEURYSM REPAIR;  Surgeon: Elam Dutch, MD;  Location: [place];  Service: Vascular;  Laterality: N/A;  . Bare-metal stent     Left circumflex about 10 years ago  . CARDIAC CATHETERIZATION     11/05/2008  . CORONARY ARTERY BYPASS GRAFT  03/10/2017   LIMA-LAD, SVG-D,  SVG-OM, SVG-PDA  . FEMORAL-POPLITEAL BYPASS GRAFT Left 05/20/2015   Procedure: LEFT COMMON FEMORAL ARTERY ANEURYSM REPAIR;  Surgeon: Elam Dutch, MD;  Location: [place];  Service: Vascular;  Laterality: Left;  . PERCUTANEOUS CORONARY STENT INTERVENTION (PCI-S)     LAD  . PROSTATE SURGERY    . TONSILLECTOMY      Current Medications: Current Meds  Medication Sig  . aspirin EC 81 MG tablet Take 1 tablet (81 mg total) by mouth daily.  . carvedilol (COREG) 6.25 MG tablet Take 1 tablet (6.25 mg total) by mouth 2 (two) times daily with a meal.  . cloNIDine (CATAPRES) 0.2 MG tablet Take 1 tablet (0.2 mg total) by mouth daily.  . cloNIDine (CATAPRES) 0.3 MG tablet Take 1 tablet (0.3 mg total) by mouth 2 (two) times daily. Please make yearly appt with Dr. Marlou Porch for April 2022 for future refills. Thank you 1st attempt (Patient taking differently: Take 0.3 mg by mouth at bedtime.)  . furosemide (LASIX) 20 MG tablet Take 1 tablet (20 mg total) by mouth daily as needed for fluid or edema (weight gain of 3 lb).  . losartan (COZAAR) 25 MG tablet Take 1 tablet by mouth once daily  . Multiple Vitamins-Minerals (MULTIVITAMIN WITH MINERALS) tablet Take 1  tablet by mouth daily.  . nitroGLYCERIN (NITROSTAT) 0.4 MG SL tablet Place 1 tablet (0.4 mg total) under the tongue every 5 (five) minutes as needed for chest pain.  [name] PACERONE 200 MG tablet Take 1 tablet by mouth once daily  . pantoprazole (PROTONIX) 40 MG tablet Take 1 tablet (40 mg total) by mouth 2 (two) times daily.  [name] spironolactone (ALDACTONE) 25 MG tablet Take 0.5 tablets (12.5 mg total) by mouth daily.     Allergies:   Celecoxib, Diclofenac, Statins, Warfarin and related, Lisinopril, and Morphine and related   Social History   Socioeconomic History  . Marital status: Married    Spouse name: Not on file  . Number of children: Not on file  . Years of education: Not on file  . Highest education level: Not on file  Occupational History  .  Occupation: Retired  Tobacco Use  . Smoking status: Former Smoker    Packs/day: 1.00    Years: 80.00    Pack years: 80.00    Types: Cigarettes    Quit date: 03/06/2017    Years since quitting: 3.3  . Smokeless tobacco: Never Used  Vaping Use  . Vaping Use: Never used  Substance and Sexual Activity  . Alcohol use: No    Alcohol/week: 0.0 standard drinks    Comment: " No alcohol in over a year."  . Drug use: No    Comment: No illicit drug use and only takes multivitamin as far as herbal medication goes.  . Sexual activity: Not on file  Other Topics Concern  . Not on file  Social History Narrative   Lives in [place], [place] with his wife   Remains active, working around his house   Diet is regular    Social Determinants of Radio broadcast assistant Strain: Not on file  Food Insecurity: Not on file  Transportation Needs: Not on file  Physical Activity: Not on file  Stress: Not on file  Social Connections: Not on file     Family History: The patient's family history includes Arrhythmia in his brother; Coronary artery disease in his brother; Diabetes in his brother; Heart attack (age of onset: 68) in his mother; Heart disease in his brother, father, and mother; Hyperlipidemia in his brother; Hypertension in his brother.  ROS:   Please see the history of present illness.     All other systems reviewed and are negative.  EKGs/Labs/Other Studies Reviewed:     Recent Labs: 05/21/2020: ALT 15; BUN 11; Creatinine, Ser 0.98; Potassium 4.5; Sodium 137; TSH 2.980  Recent Lipid Panel    Component Value Date/Time   CHOL 204 (H) 06/27/2019 1224   TRIG 184 (H) 06/27/2019 1224   HDL 44 06/27/2019 1224   CHOLHDL 4.6 06/27/2019 1224   CHOLHDL 6 09/01/2014 1100   VLDL 15.8 09/01/2014 1100   LDLCALC 127 (H) 06/27/2019 1224   LDLDIRECT 106 (H) 12/08/2017 1244   LDLDIRECT 174.2 12/19/2011 0908     Risk Assessment/Calculations:      Physical Exam:    VS:  BP (!)  160/90 (BP Location: Left Arm, Patient Position: Sitting, Cuff Size: Normal)   Pulse 64   Ht 5\' 9"  (1.753 m)   Wt 182 lb (82.6 kg)   SpO2 95%   BMI 26.88 kg/m     Wt Readings from Last 3 Encounters:  07/21/20 182 lb (82.6 kg)  05/21/20 187 lb 6.4 oz (85 kg)  07/09/19 187 lb (84.8 kg)  GEN:  Well nourished, well developed in no acute distress HEENT: Normal NECK: No JVD; No carotid bruits LYMPHATICS: No lymphadenopathy CARDIAC: RRR, no murmurs, rubs, gallops RESPIRATORY: Mild wheezing heard bilaterally ABDOMEN: Soft, non-tender, non-distended MUSCULOSKELETAL:  No edema; No deformity  SKIN: Warm and dry NEUROLOGIC:  Alert and oriented x 3 PSYCHIATRIC:  Normal affect   ASSESSMENT:    1. Paroxysmal atrial fibrillation (HCC)   2. Essential hypertension   3. Coronary artery disease involving native coronary artery of native heart without angina pectoris    PLAN:    In order of problems listed above:  Paroxysmal atrial fibrillation - Continuing with amiodarone carvedilol.  Dr. Rayann Heman has seen.  Lab work up-to-date.  Coronary artery disease - Stable no anginal symptoms  Ischemic cardiomyopathy, chronic systolic heart failure - EF 40 to 45%.  Continue with beta-blocker and ARB.  Tobacco use - Still continues to smoke about a pack a day.  Recommended quitting.  Confusion - Improved.  Labile hypertension - Up and down.  Continuing to work with Dr. Lurlean Nanny.  Medications reviewed.     Medication Adjustments/Labs and Tests Ordered: Current medicines are reviewed at length with the patient today.  Concerns regarding medicines are outlined above.  No orders of the defined types were placed in this encounter.  No orders of the defined types were placed in this encounter.   Patient Instructions  Medication Instructions:  The current medical regimen is effective;  continue present plan and medications.  *If you need a refill on your cardiac medications before your next  appointment, please call your pharmacy*  Follow-Up: At [facility], you and your health needs are our priority.  As part of our continuing mission to provide you with exceptional heart care, we have created designated Provider Care Teams.  These Care Teams include your primary Cardiologist (physician) and Advanced Practice Providers (APPs -  Physician Assistants and Nurse Practitioners) who all work together to provide you with the care you need, when you need it.  We recommend signing up for the patient portal called "MyChart".  Sign up information is provided on this After Visit Summary.  MyChart is used to connect with patients for Virtual Visits (Telemedicine).  Patients are able to view lab/test results, encounter notes, upcoming appointments, etc.  Non-urgent messages can be sent to your provider as well.   To learn more about what you can do with MyChart, go to [email or web address].    Your next appointment:   12 month(s)  The format for your next appointment:   In Person  Provider:   Candee Furbish, MD  Thank you for choosing [facility]!!        Signed, Candee Furbish, MD  07/21/2020 11:17 AM    [place]

## 2020-07-21 NOTE — Patient Instructions (Signed)
Medication Instructions:  The current medical regimen is effective;  continue present plan and medications.  *If you need a refill on your cardiac medications before your next appointment, please call your pharmacy*  Follow-Up: At [facility], you and your health needs are our priority.  As part of our continuing mission to provide you with exceptional heart care, we have created designated Provider Care Teams.  These Care Teams include your primary Cardiologist (physician) and Advanced Practice Providers (APPs -  Physician Assistants and Nurse Practitioners) who all work together to provide you with the care you need, when you need it.  We recommend signing up for the patient portal called "MyChart".  Sign up information is provided on this After Visit Summary.  MyChart is used to connect with patients for Virtual Visits (Telemedicine).  Patients are able to view lab/test results, encounter notes, upcoming appointments, etc.  Non-urgent messages can be sent to your provider as well.   To learn more about what you can do with MyChart, go to [email or web address].    Your next appointment:   12 month(s)  The format for your next appointment:   In Person  Provider:   Mark Skains, MD   Thank you for choosing  [facility]!!      

## 2020-08-05 ENCOUNTER — Other Ambulatory Visit: Payer: Self-pay | Admitting: Internal Medicine

## 2020-08-05 ENCOUNTER — Other Ambulatory Visit: Payer: Self-pay | Admitting: Cardiology

## 2020-09-21 ENCOUNTER — Other Ambulatory Visit: Payer: Self-pay | Admitting: Cardiology

## 2020-11-02 ENCOUNTER — Other Ambulatory Visit: Payer: Self-pay | Admitting: Cardiology

## 2020-11-03 ENCOUNTER — Other Ambulatory Visit: Payer: Self-pay

## 2020-11-03 MED ORDER — CLONIDINE HCL 0.3 MG PO TABS: 0.3000 mg | ORAL_TABLET | Freq: Every day | ORAL | 3 refills | Status: DC

## 2020-11-03 NOTE — Telephone Encounter (Signed)
Outpatient Medication Detail   Disp Refills Start End   cloNIDine (CATAPRES) 0.3 MG tablet 90 tablet 3 11/03/2020    Sig - Route: Take 1 tablet (0.3 mg total) by mouth daily. - Oral   Sent to pharmacy as: cloNIDine (CATAPRES) 0.3 MG tablet   E-Prescribing Status: Receipt confirmed by pharmacy (11/03/2020  4:03 PM EDT)    Pharmacy  [facility] [place], [address]

## 2021-06-03 ENCOUNTER — Telehealth: Payer: Self-pay | Admitting: Cardiology

## 2021-06-03 ENCOUNTER — Other Ambulatory Visit: Payer: Self-pay | Admitting: Cardiology

## 2021-06-03 MED ORDER — CLONIDINE HCL 0.2 MG PO TABS: 0.2000 mg | ORAL_TABLET | Freq: Two times a day (BID) | ORAL | 1 refills | Status: DC

## 2021-06-03 NOTE — Telephone Encounter (Signed)
Spoke with wife.  A lot of pt's medications have been discontinued or changed.  Currently he has been taking Clonidine 0.2 mg BID and not the 0.3 mg as previous.  Pt has been scheduled to be seen in office for yearly f/u as well as recent hypotension leading to several medications being held.  Wife states understanding of appt date and time.  Refill will be sent into pharmacy for Clonidine 0.2 mg tablet BID.   ?

## 2021-06-03 NOTE — Telephone Encounter (Signed)
Pt c/o medication issue: ? ?1. Name of Medication: cloNIDine (CATAPRES) 0.2 MG tablet ?cloNIDine (CATAPRES) 0.3 MG tablet ? ?2. How are you currently taking this medication (dosage and times per day)? Not currently taking 0.3 MG. Takes 0.2 MG twice daily.  ? ?3. Are you having a reaction (difficulty breathing--STAT)? No  ? ?4. What is your medication issue? Pt's spouse called stating the prescription clonidine was sent to the pharmacy incorrectly. She states the patient takes two tablet daily of 0.2 and is no longer taking 0.3. She is requesting a new prescription be written and sent into the pharmacy. Please advise.  ?

## 2021-06-17 ENCOUNTER — Ambulatory Visit (INDEPENDENT_AMBULATORY_CARE_PROVIDER_SITE_OTHER): Payer: Medicare Other | Admitting: Cardiology

## 2021-06-17 ENCOUNTER — Encounter: Payer: Self-pay | Admitting: Cardiology

## 2021-06-17 DIAGNOSIS — I5022 Chronic systolic (congestive) heart failure: Secondary | ICD-10-CM

## 2021-06-17 DIAGNOSIS — I480 Paroxysmal atrial fibrillation: Secondary | ICD-10-CM | POA: Diagnosis not present

## 2021-06-17 NOTE — Patient Instructions (Signed)
Medication Instructions:  ?The current medical regimen is effective;  continue present plan and medications. ? ?*If you need a refill on your cardiac medications before your next appointment, please call your pharmacy* ? ?Follow-Up: ?At [facility], you and your health needs are our priority.  As part of our continuing mission to provide you with exceptional heart care, we have created designated Provider Care Teams.  These Care Teams include your primary Cardiologist (physician) and Advanced Practice Providers (APPs -  Physician Assistants and Nurse Practitioners) who all work together to provide you with the care you need, when you need it. ? ?We recommend signing up for the patient portal called "MyChart".  Sign up information is provided on this After Visit Summary.  MyChart is used to connect with patients for Virtual Visits (Telemedicine).  Patients are able to view lab/test results, encounter notes, upcoming appointments, etc.  Non-urgent messages can be sent to your provider as well.   ?To learn more about what you can do with MyChart, go to [email or web address].   ? ?Your next appointment:   ?6 month(s) ? ?The format for your next appointment:   ?In Person ? ?Provider:   ?Nicholes Rough, PA-C, Melina Copa, PA-C, Christen Bame, NP, or Richardson Dopp, PA-C       ? ? ?Thank you for choosing [place]!! ? ? ? ?

## 2021-06-17 NOTE — Assessment & Plan Note (Signed)
Doing well currently on amiodarone 200 mg a day.  Continue to follow lab work closely.  EKG today shows sinus rhythm 79 with PVCs.  Reassurance. ?

## 2021-06-17 NOTE — Assessment & Plan Note (Signed)
Ejection fraction has improved/normalized.  He stopped his Cozaar and carvedilol 25 mg and 6.25 mg respectively.  He can continue to stay off of this medication.  His wife made the decision to stop both the Cozaar and the Coreg appropriately.  He was quite hypotensive especially in the setting of COVID.  She does state that she would not like him to be back on the Cozaar in the future.  If we do need to use another antihypertensive we will go back to the carvedilol.  She showed me several readings.  Most of them are under excellent control. ?

## 2021-06-17 NOTE — Progress Notes (Signed)
?Cardiology Office Note:   ? ?Date:  06/17/2021  ? ?ID:  Jerry Gilbert, DOB 1940-10-04, MRN [id number] ? ?PCP:  Wyatt Mage, MD ?  ?[facility] Providers ?Cardiologist:  Candee Furbish, MD    ? ?Referring MD: Wyatt Mage, MD  ? ? ? ?History of Present Illness:   ? ?Jerry Gilbert is a 81 y.o. male here for follow-up of coronary artery disease, congestive heart failure, and atrial fibrillation. ? ?Persistent atrial fibrillation, coronary artery disease status post CABG with ischemic cardiomyopathy chronic systolic heart failure, tobacco use hypertension.  In review of Dr. Jackalyn Lombard note from 05/21/2020 he was having quite a substantial decline.  Some confusion.  Personality changes.  Shortness of breath had been stable, continued to smoke.   ? ?At his last visit, his wife reported that his confusion had improved. His blood pressure remained labile. ? ?Today: ?He is accompanied by a family member. Overall, he appears well.  ? ?Recently he had a COVID infection. He had been dehydrated prior to his infection. Since then his blood pressure has been low sometimes, such as 85 systolic. At home his blood pressure typically ranges from 120-130/50-70. ? ?They also note a high blood pressure in the 160s that corresponded with some ankle swelling. After taking a dose of Lasix both his blood pressure and edema improved. ? ?He denies any palpitations, chest pain, or shortness of breath. No lightheadedness, headaches, syncope, orthopnea, or PND. ? ? ? ?Past Medical History:  ?Diagnosis Date  ? AAA (abdominal aortic aneurysm) ([place]) 11/04/09  ? 3.6 cmx 3.4 cm  ? Anemia   ? Atrial tachycardia ([place])   ? CAD (coronary artery disease)   ? S/P PCI LAD and subsequent CABG  ? Chaotic atrial rhythm   ? Dysrhythmia   ? H/O transfusion of packed red blood cells   ? 16 units of blood in 2014 d/t GI bleed  ? H/O: GI bleed   ? Heart murmur   ? in 1961  ? Hypertension   ? Ischemic cardiomyopathy   ? Kidney stones   ? Myocardial infarction [facility])    ? Nocturia   ? Personal history of unspecified circulatory disease   ? Transient Ischemic attacks  ? Prostate cancer ([place])   ? Shortness of breath dyspnea   ? 'with minimal exertion'  ? Stroke [facility])   ? "small TIA"  ? Tobacco abuse   ? ? ?Past Surgical History:  ?Procedure Laterality Date  ? ABDOMINAL AORTIC ANEURYSM REPAIR N/A 12/15/2014  ? Procedure: ABDOMINAL AORTIC ANEURYSM REPAIR;  Surgeon: Elam Dutch, MD;  Location: [place];  Service: Vascular;  Laterality: N/A;  ? Bare-metal stent    ? Left circumflex about 10 years ago  ? CARDIAC CATHETERIZATION    ? 11/05/2008  ? CORONARY ARTERY BYPASS GRAFT  03/10/2017  ? LIMA-LAD, SVG-D, SVG-OM, SVG-PDA  ? FEMORAL-POPLITEAL BYPASS GRAFT Left 05/20/2015  ? Procedure: LEFT COMMON FEMORAL ARTERY ANEURYSM REPAIR;  Surgeon: Elam Dutch, MD;  Location: [place];  Service: Vascular;  Laterality: Left;  ? PERCUTANEOUS CORONARY STENT INTERVENTION (PCI-S)    ? LAD  ? PROSTATE SURGERY    ? TONSILLECTOMY    ? ? ?Current Medications: ?Current Meds  ?Medication Sig  ? amiodarone (PACERONE) 200 MG tablet Take 1 tablet by mouth once daily  ? aspirin EC 81 MG tablet Take 1 tablet (81 mg total) by mouth daily.  ? cloNIDine (CATAPRES) 0.2 MG tablet Take 1 tablet (0.2 mg  total) by mouth 2 (two) times daily.  ? furosemide (LASIX) 20 MG tablet Take 1 tablet (20 mg total) by mouth daily as needed for fluid or edema (weight gain of 3 lb).  ? Multiple Vitamins-Minerals (MULTIVITAMIN WITH MINERALS) tablet Take 1 tablet by mouth daily.  ? nitroGLYCERIN (NITROSTAT) 0.4 MG SL tablet Place 1 tablet (0.4 mg total) under the tongue every 5 (five) minutes as needed for chest pain.  ? pantoprazole (PROTONIX) 40 MG tablet Take 1 tablet by mouth twice daily  ? spironolactone (ALDACTONE) 25 MG tablet Take 1 tablet by mouth once daily (Patient taking differently: 12.5 mg.)  ?  ? ?Allergies:   Celecoxib, Diclofenac, Statins, Warfarin and related, Lisinopril, and Morphine and related  ? ?Social History   ? ?Socioeconomic History  ? Marital status: Married  ?  Spouse name: Not on file  ? Number of children: Not on file  ? Years of education: Not on file  ? Highest education level: Not on file  ?Occupational History  ? Occupation: Retired  ?Tobacco Use  ? Smoking status: Former  ?  Packs/day: 1.00  ?  Years: 80.00  ?  Pack years: 80.00  ?  Types: Cigarettes  ?  Quit date: 03/06/2017  ?  Years since quitting: 4.2  ? Smokeless tobacco: Never  ?Vaping Use  ? Vaping Use: Never used  ?Substance and Sexual Activity  ? Alcohol use: No  ?  Alcohol/week: 0.0 standard drinks  ?  Comment: " No alcohol in over a year."  ? Drug use: No  ?  Comment: No illicit drug use and only takes multivitamin as far as herbal medication goes.  ? Sexual activity: Not on file  ?Other Topics Concern  ? Not on file  ?Social History Narrative  ? Lives in [place], [place] with his wife  ? Remains active, working around his house  ? Diet is regular   ? ?Social Determinants of Health  ? ?Financial Resource Strain: Not on file  ?Food Insecurity: Not on file  ?Transportation Needs: Not on file  ?Physical Activity: Not on file  ?Stress: Not on file  ?Social Connections: Not on file  ?  ? ?Family History: ?The patient's family history includes Arrhythmia in his brother; Coronary artery disease in his brother; Diabetes in his brother; Heart attack (age of onset: 81) in his mother; Heart disease in his brother, father, and mother; Hyperlipidemia in his brother; Hypertension in his brother. ? ?ROS:   ?Please see the history of present illness. ? All other systems reviewed and are negative. ? ?EKGs/Labs/Other Studies Reviewed:   ? ?Bilateral Renal Artery Doppler 07/19/2019: ?Summary:  ?Largest Aortic Diameter: 2.6 cm  ?   ?Renal:  ?   ?Right: Normal size right kidney. Abnormal right Resistive Index.  ?       Abnormal cortical thickness of right kidney. No evidence of  ?       right renal artery stenosis. RRV flow present.  ?Left:  Normal size of left  kidney. Abnormal left Resistive Index.  ?       Abnormal cortical thickness of the left kidney. No evidence  ?       of left renal artery stenosis. LRV flow present.  ?Mesenteric:  ?Normal Celiac artery and Superior Mesenteric artery findings.  ?   ?Patent IVC.  ? ?Echo 07/09/2019: ? 1. Left ventricular ejection fraction, by estimation, is 40 to 45%. The  ?left ventricle has mildly decreased function. The left ventricle  ?  demonstrates regional wall motion abnormalities (see scoring  ?diagram/findings for description). There is moderate  ?left ventricular hypertrophy. Left ventricular diastolic parameters are  ?consistent with Grade I diastolic dysfunction (impaired relaxation). There  ?is incoordinate septal motion. There is severe hypokinesis of the left  ?ventricular, entire inferior wall  ?and inferolateral wall.  ? 2. Right ventricular systolic function is low normal. The right  ?ventricular size is normal.  ? 3. The mitral valve is abnormal. Trivial mitral valve regurgitation.  ? 4. The aortic valve is tricuspid. Aortic valve regurgitation is not  ?visualized. Mild to moderate aortic valve sclerosis/calcification is  ?present, without any evidence of aortic stenosis.  ? 5. Aortic dilatation noted. 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  ? ?Comparison(s): No significant change from prior study.  ? ?Monitor 08/2018: ?The patient was monitored for 14 days. ?The predominant rhythm was sinus with an average rate of 57 bpm (range 41 to 116 bpm). ?Rare PACs and occasional PVCs were observed. ?There were 2 episodes of paroxysmal supraventricular tachycardia lasting up to 17.5 seconds with a maximal rate of 112 bpm. ?Some episodes of bradycardia do not have clear P waves and may reflect a junctional rhythm (possibly with underlying atrial fibrillation). ?Patient triggered events correspond to sinus rhythm with PVCs. ?  ?Predominantly sinus rhythm with occasional PVCs and 2 runs of PSVT lasting  up to 17.5 seconds.  Episodes junctional rhythm with underlying atrial fibrillation cannot be excluded. ? ? ?EKG:  EKG is personally reviewed and interpreted. ?06/17/2021: Sinus rhythm. Rate 79 bpm. PVCs. ? ?Recen

## 2021-08-02 ENCOUNTER — Other Ambulatory Visit: Payer: Self-pay | Admitting: Cardiology

## 2021-08-02 ENCOUNTER — Encounter: Payer: Self-pay | Admitting: Cardiology

## 2021-08-02 ENCOUNTER — Other Ambulatory Visit: Payer: Self-pay | Admitting: Internal Medicine

## 2021-08-02 MED ORDER — CLONIDINE HCL 0.2 MG PO TABS: 0.2000 mg | ORAL_TABLET | Freq: Two times a day (BID) | ORAL | 3 refills | Status: DC

## 2021-08-02 MED ORDER — AMIODARONE HCL 200 MG PO TABS: 200.0000 mg | ORAL_TABLET | Freq: Every day | ORAL | 3 refills | Status: DC

## 2021-09-23 ENCOUNTER — Ambulatory Visit: Payer: Medicare Other | Admitting: Cardiology

## 2022-01-25 ENCOUNTER — Other Ambulatory Visit: Payer: Self-pay | Admitting: Cardiology

## 2022-04-14 ENCOUNTER — Other Ambulatory Visit: Payer: Self-pay | Admitting: Cardiology

## 2022-05-20 ENCOUNTER — Other Ambulatory Visit: Payer: Self-pay

## 2022-05-20 MED ORDER — NITROGLYCERIN 0.4 MG SL SUBL: SUBLINGUAL_TABLET | SUBLINGUAL | 1 refills | Status: DC

## 2022-07-28 ENCOUNTER — Other Ambulatory Visit: Payer: Self-pay | Admitting: Cardiology

## 2022-08-23 ENCOUNTER — Other Ambulatory Visit: Payer: Self-pay | Admitting: Cardiology

## 2022-09-26 ENCOUNTER — Other Ambulatory Visit: Payer: Self-pay | Admitting: Cardiology

## 2022-09-27 ENCOUNTER — Encounter: Payer: Self-pay | Admitting: Cardiology

## 2022-09-27 MED ORDER — PANTOPRAZOLE SODIUM 40 MG PO TBEC: 40.0000 mg | DELAYED_RELEASE_TABLET | Freq: Two times a day (BID) | ORAL | 0 refills | Status: DC

## 2022-09-27 MED ORDER — SPIRONOLACTONE 25 MG PO TABS: 12.5000 mg | ORAL_TABLET | Freq: Every day | ORAL | 0 refills | Status: DC

## 2022-09-27 MED ORDER — AMIODARONE HCL 200 MG PO TABS: 200.0000 mg | ORAL_TABLET | Freq: Every day | ORAL | 0 refills | Status: DC

## 2022-09-27 NOTE — Addendum Note (Signed)
Addended by: Alvin Critchley A on: 09/27/2022 12:35 PM   Modules accepted: Orders

## 2022-09-27 NOTE — Addendum Note (Signed)
Addended by: Alvin Critchley A on: 09/27/2022 02:46 PM   Modules accepted: Orders

## 2022-10-26 ENCOUNTER — Encounter (HOSPITAL_BASED_OUTPATIENT_CLINIC_OR_DEPARTMENT_OTHER): Payer: Self-pay | Admitting: Cardiology

## 2022-10-26 ENCOUNTER — Ambulatory Visit (INDEPENDENT_AMBULATORY_CARE_PROVIDER_SITE_OTHER): Payer: Medicare Other | Admitting: Cardiology

## 2022-10-26 VITALS — BP 124/80 | HR 78 | Ht 71.0 in | Wt 178.0 lb

## 2022-10-26 DIAGNOSIS — I5022 Chronic systolic (congestive) heart failure: Secondary | ICD-10-CM

## 2022-10-26 DIAGNOSIS — I2510 Atherosclerotic heart disease of native coronary artery without angina pectoris: Secondary | ICD-10-CM

## 2022-10-26 DIAGNOSIS — I480 Paroxysmal atrial fibrillation: Secondary | ICD-10-CM

## 2022-10-26 MED ORDER — AMIODARONE HCL 200 MG PO TABS: 200.0000 mg | ORAL_TABLET | Freq: Every day | ORAL | 0 refills | Status: DC

## 2022-10-26 MED ORDER — SPIRONOLACTONE 25 MG PO TABS: 12.5000 mg | ORAL_TABLET | Freq: Every day | ORAL | 0 refills | Status: AC

## 2022-10-26 MED ORDER — PANTOPRAZOLE SODIUM 40 MG PO TBEC: 40.0000 mg | DELAYED_RELEASE_TABLET | Freq: Two times a day (BID) | ORAL | 0 refills | Status: DC

## 2022-10-26 MED ORDER — CLONIDINE HCL 0.2 MG PO TABS: 0.2000 mg | ORAL_TABLET | Freq: Two times a day (BID) | ORAL | 0 refills | Status: DC

## 2022-10-26 MED ORDER — NITROGLYCERIN 0.4 MG SL SUBL: SUBLINGUAL_TABLET | SUBLINGUAL | 1 refills | Status: AC

## 2022-10-26 NOTE — Progress Notes (Signed)
Cardiology Office Note:  .   Date:  10/26/2022  ID:  Jerry Gilbert, DOB [birth year], MRN [id number] PCP: Gemma Payor, MD  [facility] Providers Cardiologist:  Donato Schultz, MD    History of Present Illness: .     Discussed the use of AI scribe software for clinical note transcription with the patient, who gave verbal consent to proceed.  History of Present Illness   The patient, a 82 year old individual with a history of coronary artery disease status post coronary artery bypass graft (CABG), ischemic cardiomyopathy, chronic systolic heart failure, and tobacco use, presents for a follow-up visit. The patient's most recent echocardiogram in 2021 showed an ejection fraction of 40-45%. He also has paroxysmal atrial fibrillation, which is being managed with amiodarone 200 mg daily. The patient reports feeling "pretty good" and denies experiencing any chest pain.  The patient has a history of tobacco use but reports cessation approximately 20 minutes prior to the visit. He has been adhering to his medication regimen, which includes amiodarone, without any issues.  Previously, the patient experienced adverse effects from losartan and carvedilol, including confusion, and these medications were subsequently discontinued. His hypertension is currently managed with clonidine 0.2 mg twice daily.  The patient's most recent labs were done in April, with no alarming results reported. He has proteinuria, but the creatinine levels have remained stable. He is not currently on any cholesterol medication due to intolerance.  His atrial fibrillation has been well-controlled with amiodarone, with no recent episodes reported. He is also on low-dose spironolactone for his ischemic cardiomyopathy. He is not currently on a blood thinner but is taking low-dose aspirin for coronary artery disease.       Studies Reviewed: [name]   EKG Interpretation Date/Time:  Wednesday October 26 2022 11:45:22 EDT Ventricular  Rate:  78 PR Interval:  176 QRS Duration:  122 QT Interval:  414 QTC Calculation: 471 R Axis:   -53  Text Interpretation: Sinus rhythm with frequent Premature ventricular complexes Left axis deviation Non-specific intra-ventricular conduction delay Minimal voltage criteria for LVH, may be normal variant ( Cornell product ) Nonspecific ST and T wave abnormality When compared with ECG of 31-Dec-2014 10:48, Premature ventricular complexes are now Present Criteria for Septal infarct are no longer Present Nonspecific T wave abnormality no longer evident in Inferior leads T wave inversion now evident in Lateral leads Confirmed by Donato Schultz ([id number]) on 10/26/2022 12:13:09 PM    DIAGNOSTIC Echocardiogram: EF 40-45% (2021) EKG: Normal (10/26/2022) Risk Assessment/Calculations:            Physical Exam:   VS:  BP 124/80   Pulse 78   Ht 5\' 11"  (1.803 m)   Wt 178 lb (80.7 kg)   BMI 24.83 kg/m    Wt Readings from Last 3 Encounters:  10/26/22 178 lb (80.7 kg)  06/17/21 171 lb 6.4 oz (77.7 kg)  07/21/20 182 lb (82.6 kg)    GEN: Well nourished, well developed in no acute distress NECK: No JVD; No carotid bruits CARDIAC: RRR, no murmurs, rubs, gallops RESPIRATORY:  Clear to auscultation without rales, wheezing or rhonchi  ABDOMEN: Soft, non-tender, non-distended EXTREMITIES:  No edema; No deformity   ASSESSMENT AND PLAN: .   Assessment and Plan    Coronary Artery Disease (CAD) status post Coronary Artery Bypass Graft (CABG) No current chest pain. Patient is on low dose aspirin 81mg  daily. Statin intolerant. -Continue current management with aspirin 81mg  daily.  Ischemic Cardiomyopathy EF 40-45% on last echocardiogram  in 2021. Patient is on spironolactone 12.5mg  daily. Not currently on carvedilol or losartan due to adverse effects. -Continue current management with spironolactone 12.5mg  daily.  Paroxysmal Atrial Fibrillation Currently managed with amiodarone 200mg  daily. No recent  episodes. EKG today was reassuring. -Continue amiodarone 200mg  daily. -Order labs to monitor amiodarone safety.  Hypertension Managed with clonidine 0.2mg  twice daily. -Continue clonidine 0.2mg  twice daily.  Tobacco Use Patient continues to smoke. -Encourage smoking cessation.  General Health Maintenance -Order labs to monitor kidney function and overall health.            Dispo: 1 yr  Signed, Donato Schultz, MD

## 2022-10-26 NOTE — Patient Instructions (Signed)
Medication Instructions:  Your physician recommends that you continue on your current medications as directed. Please refer to the Current Medication list given to you today.  *If you need a refill on your cardiac medications before your next appointment, please call your pharmacy*  Lab Work: TODAY: CMET, CBC, FLP, TSH, T4 If you have labs (blood work) drawn today and your tests are completely normal, you will receive your results only by: MyChart Message (if you have MyChart) OR A paper copy in the mail If you have any lab test that is abnormal or we need to change your treatment, we will call you to review the results.  Follow-Up: At [facility], you and your health needs are our priority.  As part of our continuing mission to provide you with exceptional heart care, we have created designated Provider Care Teams.  These Care Teams include your primary Cardiologist (physician) and Advanced Practice Providers (APPs -  Physician Assistants and Nurse Practitioners) who all work together to provide you with the care you need, when you need it.   Your next appointment:   1 year  Provider:   Donato Schultz, MD

## 2022-10-27 LAB — CBC
Hematocrit: 45.2 % (ref 37.5–51.0)
Hemoglobin: 14.8 g/dL (ref 13.0–17.7)
MCH: 29.7 pg (ref 26.6–33.0)
MCHC: 32.7 g/dL (ref 31.5–35.7)
MCV: 91 fL (ref 79–97)
Platelets: 250 10*3/uL (ref 150–450)
RBC: 4.98 x10E6/uL (ref 4.14–5.80)
RDW: 14.8 % (ref 11.6–15.4)
WBC: 11.1 10*3/uL — ABNORMAL HIGH (ref 3.4–10.8)

## 2022-10-27 LAB — COMPREHENSIVE METABOLIC PANEL
ALT: 13 IU/L (ref 0–44)
AST: 16 IU/L (ref 0–40)
Albumin: 4.2 g/dL (ref 3.7–4.7)
Alkaline Phosphatase: 119 IU/L (ref 44–121)
BUN/Creatinine Ratio: 10 (ref 10–24)
BUN: 13 mg/dL (ref 8–27)
Bilirubin Total: 0.4 mg/dL (ref 0.0–1.2)
CO2: 25 mmol/L (ref 20–29)
Calcium: 9.8 mg/dL (ref 8.6–10.2)
Chloride: 92 mmol/L — ABNORMAL LOW (ref 96–106)
Creatinine, Ser: 1.24 mg/dL (ref 0.76–1.27)
Globulin, Total: 2.6 g/dL (ref 1.5–4.5)
Glucose: 109 mg/dL — ABNORMAL HIGH (ref 70–99)
Potassium: 5.5 mmol/L — ABNORMAL HIGH (ref 3.5–5.2)
Sodium: 132 mmol/L — ABNORMAL LOW (ref 134–144)
Total Protein: 6.8 g/dL (ref 6.0–8.5)
eGFR: 58 mL/min/{1.73_m2} — ABNORMAL LOW (ref >59)

## 2022-10-27 LAB — T4, FREE: Free T4: 1.59 ng/dL (ref 0.82–1.77)

## 2022-10-27 LAB — LIPID PANEL
Chol/HDL Ratio: 7.4 ratio — ABNORMAL HIGH (ref 0.0–5.0)
Cholesterol, Total: 309 mg/dL — ABNORMAL HIGH (ref 100–199)
HDL: 42 mg/dL (ref >39)
LDL Chol Calc (NIH): 219 mg/dL — ABNORMAL HIGH (ref 0–99)
Triglycerides: 239 mg/dL — ABNORMAL HIGH (ref 0–149)
VLDL Cholesterol Cal: 48 mg/dL — ABNORMAL HIGH (ref 5–40)

## 2022-10-27 LAB — TSH: TSH: 4.09 u[IU]/mL (ref 0.450–4.500)

## 2022-11-03 ENCOUNTER — Encounter (HOSPITAL_BASED_OUTPATIENT_CLINIC_OR_DEPARTMENT_OTHER): Payer: Self-pay | Admitting: Cardiology

## 2022-11-22 ENCOUNTER — Encounter (HOSPITAL_BASED_OUTPATIENT_CLINIC_OR_DEPARTMENT_OTHER): Payer: Self-pay | Admitting: Cardiology

## 2022-11-22 ENCOUNTER — Other Ambulatory Visit (HOSPITAL_BASED_OUTPATIENT_CLINIC_OR_DEPARTMENT_OTHER): Payer: Self-pay | Admitting: Cardiology

## 2022-11-22 MED ORDER — CLONIDINE HCL 0.2 MG PO TABS: 0.2000 mg | ORAL_TABLET | Freq: Two times a day (BID) | ORAL | 3 refills | Status: AC

## 2022-11-22 MED ORDER — AMIODARONE HCL 200 MG PO TABS: 200.0000 mg | ORAL_TABLET | Freq: Every day | ORAL | 3 refills | Status: AC

## 2022-12-25 ENCOUNTER — Other Ambulatory Visit (HOSPITAL_BASED_OUTPATIENT_CLINIC_OR_DEPARTMENT_OTHER): Payer: Self-pay | Admitting: Cardiology

## 2023-05-20 ENCOUNTER — Encounter (HOSPITAL_BASED_OUTPATIENT_CLINIC_OR_DEPARTMENT_OTHER): Payer: Self-pay | Admitting: Cardiology

## 2023-05-20 DIAGNOSIS — I4719 Other supraventricular tachycardia: Secondary | ICD-10-CM

## 2023-05-22 ENCOUNTER — Telehealth: Payer: Self-pay | Admitting: Cardiology

## 2023-05-22 NOTE — Telephone Encounter (Signed)
 Received a message via pt schedule stating:  "Thanks. Could he call me about Jaquann's condition? After being sent home from ER yesterday with arrangements for home health PT etc when I thought he should be admitted I am trying to decide if I should take him back to the ER. He is still in AFib with rate 114. He is confused and unable to get up to bedside commode. BP hypertensive.150/111.sats 90 percent on 3 liters concerned that gabapentin may be causing some of the confusion and weakness so only gave 100 mg this am. If not that I believe he is probably in heart failure.. I tried to insist he be admitted yesterday but was refused as not meeting criteria though he is still being treated with oral antibiotics."  After setting up the pt for a hospital f/u on 06/03 due to not wanting to schedule with a PA or NP. Please advise.

## 2023-05-22 NOTE — Telephone Encounter (Signed)
 Thought I might should route this over to y'all to see how you would like to proceed

## 2023-05-22 NOTE — Telephone Encounter (Signed)
 Left voicemail to return call to office

## 2023-05-23 NOTE — Telephone Encounter (Signed)
 We are presently back in the ER in [place]. They plan to admit him. They treated with lopressor last night. Guess we will have to go with Dr Verlene Mayer' s treatment while here. I will let him know I talked with you prior to coming to the ER and your recommendation to me. Thanks. Probably going to rehab post admission   Pt's wife will call back after his hospitalization in [place], [place]

## 2023-05-23 NOTE — Telephone Encounter (Signed)
 Please see the MyChart message reply(ies) for my assessment and plan.  So sorry to hear about his recent fall and vertebral compression fractures as well as right arm fracture.  I had a chance to review the outside hospital records.  There his EKG does show atrial fibrillation however under reasonable rate control usually under 110 bpm.  This is the range that we are looking for when people are in atrial fibrillation.  He is not on anticoagulation because of prior GI bleeding and now because of fall risks.  Normally we would place individuals with atrial fibrillation on anticoagulation such as Eliquis.  This helps to reduce stroke risk.  We have many patients that are in persistent atrial fibrillation under good rate control and they live with this without any issues.  However, it would not be unreasonable at this time to increase his amiodarone from 200 mg once a day up to 200 mg twice a day.  There is a chance that this may help with his atrial fibrillation.  With his prior cardiac history, bypass etc. and prior history of atrial fibrillation and recent stressors on his body with fractures, it is not uncommon to see atrial fibrillation return in these stressful situations.  My suggestion would be to increase amiodarone to 200 mg twice a day.  I can also have my team look into an upcoming appointment to have me evaluate him further.     This patient gave consent for this Medical Advice Message and is aware that it may result in a bill to [employer], as well as the possibility of receiving a bill for a co-payment or deductible. They are an established patient, but are not seeking medical advice exclusively about a problem treated during an in person or video visit in the last seven days. I did not recommend an in person or video visit within seven days of my reply.    I spent a total of 15 minutes cumulative time within 7 days through [employer].  Donato Schultz, MD

## 2023-06-08 NOTE — Telephone Encounter (Signed)
 FYI

## 2023-06-13 DEATH — deceased

## 2023-08-14 ENCOUNTER — Ambulatory Visit: Admitting: Cardiology

## 2023-08-15 ENCOUNTER — Ambulatory Visit: Admitting: Cardiology
